# Patient Record
Sex: Male | Born: 1945 | Race: White | Hispanic: No | Marital: Married | Smoking: Former smoker
Health system: Southern US, Community
[De-identification: ages and names within clinical notes are randomized; demographics above are authoritative.]

## PROBLEM LIST (undated history)

## (undated) DIAGNOSIS — I739 Peripheral vascular disease, unspecified: Secondary | ICD-10-CM

## (undated) DIAGNOSIS — K219 Gastro-esophageal reflux disease without esophagitis: Secondary | ICD-10-CM

## (undated) DIAGNOSIS — T8859XA Other complications of anesthesia, initial encounter: Secondary | ICD-10-CM

## (undated) DIAGNOSIS — I7300 Raynaud's syndrome without gangrene: Secondary | ICD-10-CM

## (undated) DIAGNOSIS — E785 Hyperlipidemia, unspecified: Secondary | ICD-10-CM

## (undated) DIAGNOSIS — N400 Enlarged prostate without lower urinary tract symptoms: Secondary | ICD-10-CM

## (undated) DIAGNOSIS — J309 Allergic rhinitis, unspecified: Secondary | ICD-10-CM

## (undated) DIAGNOSIS — Z9889 Other specified postprocedural states: Secondary | ICD-10-CM

## (undated) DIAGNOSIS — K5730 Diverticulosis of large intestine without perforation or abscess without bleeding: Secondary | ICD-10-CM

## (undated) DIAGNOSIS — Z86010 Personal history of colonic polyps: Secondary | ICD-10-CM

## (undated) DIAGNOSIS — R109 Unspecified abdominal pain: Secondary | ICD-10-CM

## (undated) DIAGNOSIS — T4145XA Adverse effect of unspecified anesthetic, initial encounter: Secondary | ICD-10-CM

## (undated) DIAGNOSIS — R9389 Abnormal findings on diagnostic imaging of other specified body structures: Secondary | ICD-10-CM

## (undated) DIAGNOSIS — F4312 Post-traumatic stress disorder, chronic: Secondary | ICD-10-CM

## (undated) DIAGNOSIS — R51 Headache: Secondary | ICD-10-CM

## (undated) DIAGNOSIS — F419 Anxiety disorder, unspecified: Secondary | ICD-10-CM

## (undated) DIAGNOSIS — G4700 Insomnia, unspecified: Secondary | ICD-10-CM

## (undated) DIAGNOSIS — K644 Residual hemorrhoidal skin tags: Secondary | ICD-10-CM

## (undated) DIAGNOSIS — M1710 Unilateral primary osteoarthritis, unspecified knee: Secondary | ICD-10-CM

## (undated) DIAGNOSIS — R112 Nausea with vomiting, unspecified: Secondary | ICD-10-CM

## (undated) DIAGNOSIS — H9190 Unspecified hearing loss, unspecified ear: Secondary | ICD-10-CM

## (undated) HISTORY — PX: OTHER SURGICAL HISTORY: SHX169

## (undated) HISTORY — DX: Hyperlipidemia, unspecified: E78.5

## (undated) HISTORY — DX: Unspecified hearing loss, unspecified ear: H91.90

## (undated) HISTORY — DX: Insomnia, unspecified: G47.00

## (undated) HISTORY — DX: Gastro-esophageal reflux disease without esophagitis: K21.9

## (undated) HISTORY — PX: UPPER GASTROINTESTINAL ENDOSCOPY: SHX188

## (undated) HISTORY — DX: Peripheral vascular disease, unspecified: I73.9

## (undated) HISTORY — DX: Post-traumatic stress disorder, chronic: F43.12

## (undated) HISTORY — DX: Raynaud's syndrome without gangrene: I73.00

## (undated) HISTORY — DX: Headache: R51

## (undated) HISTORY — PX: POLYPECTOMY: SHX149

## (undated) HISTORY — DX: Residual hemorrhoidal skin tags: K64.4

## (undated) HISTORY — DX: Abnormal findings on diagnostic imaging of other specified body structures: R93.89

## (undated) HISTORY — DX: Benign prostatic hyperplasia without lower urinary tract symptoms: N40.0

## (undated) HISTORY — DX: Personal history of colonic polyps: Z86.010

## (undated) HISTORY — PX: APPENDECTOMY: SHX54

## (undated) HISTORY — DX: Unspecified abdominal pain: R10.9

## (undated) HISTORY — DX: Diverticulosis of large intestine without perforation or abscess without bleeding: K57.30

## (undated) HISTORY — DX: Unilateral primary osteoarthritis, unspecified knee: M17.10

## (undated) HISTORY — DX: Allergic rhinitis, unspecified: J30.9

## (undated) HISTORY — DX: Anxiety disorder, unspecified: F41.9

---

## 1998-07-15 ENCOUNTER — Encounter (INDEPENDENT_AMBULATORY_CARE_PROVIDER_SITE_OTHER): Payer: Self-pay | Admitting: Specialist

## 1998-07-15 ENCOUNTER — Other Ambulatory Visit: Admission: RE | Admit: 1998-07-15 | Discharge: 1998-07-15 | Payer: Self-pay | Admitting: General Surgery

## 2000-01-02 HISTORY — PX: OTHER SURGICAL HISTORY: SHX169

## 2001-06-02 ENCOUNTER — Encounter: Payer: Self-pay | Admitting: Internal Medicine

## 2001-06-02 ENCOUNTER — Encounter: Admission: RE | Admit: 2001-06-02 | Discharge: 2001-06-02 | Payer: Self-pay | Admitting: Internal Medicine

## 2003-11-22 ENCOUNTER — Ambulatory Visit: Payer: Self-pay | Admitting: Internal Medicine

## 2003-12-07 ENCOUNTER — Ambulatory Visit: Payer: Self-pay

## 2003-12-22 ENCOUNTER — Ambulatory Visit: Payer: Self-pay | Admitting: Internal Medicine

## 2003-12-29 ENCOUNTER — Ambulatory Visit (HOSPITAL_COMMUNITY): Admission: RE | Admit: 2003-12-29 | Discharge: 2003-12-29 | Payer: Self-pay | Admitting: Internal Medicine

## 2005-01-01 HISTORY — PX: OTHER SURGICAL HISTORY: SHX169

## 2005-01-25 ENCOUNTER — Ambulatory Visit: Payer: Self-pay | Admitting: Internal Medicine

## 2005-02-22 ENCOUNTER — Ambulatory Visit: Payer: Self-pay | Admitting: Internal Medicine

## 2005-04-10 ENCOUNTER — Ambulatory Visit: Payer: Self-pay | Admitting: Internal Medicine

## 2005-09-22 ENCOUNTER — Emergency Department (HOSPITAL_COMMUNITY): Admission: EM | Admit: 2005-09-22 | Discharge: 2005-09-22 | Payer: Self-pay | Admitting: Family Medicine

## 2005-09-30 ENCOUNTER — Emergency Department (HOSPITAL_COMMUNITY): Admission: EM | Admit: 2005-09-30 | Discharge: 2005-09-30 | Payer: Self-pay | Admitting: Emergency Medicine

## 2005-10-03 ENCOUNTER — Ambulatory Visit (HOSPITAL_COMMUNITY): Admission: RE | Admit: 2005-10-03 | Discharge: 2005-10-04 | Payer: Self-pay | Admitting: Neurological Surgery

## 2006-01-16 ENCOUNTER — Ambulatory Visit: Payer: Self-pay | Admitting: Internal Medicine

## 2006-07-17 ENCOUNTER — Ambulatory Visit: Payer: Self-pay | Admitting: Gastroenterology

## 2006-07-19 ENCOUNTER — Ambulatory Visit: Payer: Self-pay | Admitting: Gastroenterology

## 2006-07-25 ENCOUNTER — Ambulatory Visit: Payer: Self-pay | Admitting: Gastroenterology

## 2006-07-25 HISTORY — PX: COLONOSCOPY: SHX174

## 2006-07-25 LAB — HM COLONOSCOPY

## 2006-08-21 ENCOUNTER — Encounter: Payer: Self-pay | Admitting: Internal Medicine

## 2006-08-21 DIAGNOSIS — N400 Enlarged prostate without lower urinary tract symptoms: Secondary | ICD-10-CM | POA: Insufficient documentation

## 2006-08-21 DIAGNOSIS — H9190 Unspecified hearing loss, unspecified ear: Secondary | ICD-10-CM | POA: Insufficient documentation

## 2006-08-21 DIAGNOSIS — IMO0002 Reserved for concepts with insufficient information to code with codable children: Secondary | ICD-10-CM | POA: Insufficient documentation

## 2006-08-21 DIAGNOSIS — E785 Hyperlipidemia, unspecified: Secondary | ICD-10-CM

## 2006-08-21 DIAGNOSIS — I7300 Raynaud's syndrome without gangrene: Secondary | ICD-10-CM

## 2006-08-21 DIAGNOSIS — M179 Osteoarthritis of knee, unspecified: Secondary | ICD-10-CM | POA: Insufficient documentation

## 2006-08-21 DIAGNOSIS — M1710 Unilateral primary osteoarthritis, unspecified knee: Secondary | ICD-10-CM

## 2006-08-21 HISTORY — DX: Benign prostatic hyperplasia without lower urinary tract symptoms: N40.0

## 2006-08-21 HISTORY — DX: Reserved for concepts with insufficient information to code with codable children: IMO0002

## 2006-08-21 HISTORY — DX: Osteoarthritis of knee, unspecified: M17.9

## 2006-08-21 HISTORY — DX: Raynaud's syndrome without gangrene: I73.00

## 2006-08-21 HISTORY — DX: Unspecified hearing loss, unspecified ear: H91.90

## 2006-08-21 HISTORY — DX: Hyperlipidemia, unspecified: E78.5

## 2007-04-18 DIAGNOSIS — K644 Residual hemorrhoidal skin tags: Secondary | ICD-10-CM

## 2007-04-18 DIAGNOSIS — E7800 Pure hypercholesterolemia, unspecified: Secondary | ICD-10-CM | POA: Insufficient documentation

## 2007-04-18 DIAGNOSIS — Z860100 Personal history of colon polyps, unspecified: Secondary | ICD-10-CM | POA: Insufficient documentation

## 2007-04-18 DIAGNOSIS — K5730 Diverticulosis of large intestine without perforation or abscess without bleeding: Secondary | ICD-10-CM

## 2007-04-18 HISTORY — DX: Diverticulosis of large intestine without perforation or abscess without bleeding: K57.30

## 2007-04-18 HISTORY — DX: Residual hemorrhoidal skin tags: K64.4

## 2007-04-18 HISTORY — DX: Personal history of colonic polyps: Z86.010

## 2007-04-18 HISTORY — DX: Personal history of colon polyps, unspecified: Z86.0100

## 2007-08-20 ENCOUNTER — Ambulatory Visit: Payer: Self-pay | Admitting: Internal Medicine

## 2007-08-20 LAB — CONVERTED CEMR LAB
ALT: 29 units/L (ref 0–53)
AST: 26 units/L (ref 0–37)
Albumin: 3.7 g/dL (ref 3.5–5.2)
BUN: 19 mg/dL (ref 6–23)
Basophils Relative: 0.9 % (ref 0.0–3.0)
Chloride: 104 meq/L (ref 96–112)
Creatinine, Ser: 1 mg/dL (ref 0.4–1.5)
Direct LDL: 115.6 mg/dL
Eosinophils Absolute: 0.1 10*3/uL (ref 0.0–0.7)
Eosinophils Relative: 2.5 % (ref 0.0–5.0)
GFR calc non Af Amer: 81 mL/min
Glucose, Bld: 102 mg/dL — ABNORMAL HIGH (ref 70–99)
HCT: 43.5 % (ref 39.0–52.0)
MCV: 87.3 fL (ref 78.0–100.0)
Neutrophils Relative %: 56.4 % (ref 43.0–77.0)
RBC: 4.98 M/uL (ref 4.22–5.81)
Specific Gravity, Urine: 1.02 (ref 1.000–1.03)
Total Protein, Urine: NEGATIVE mg/dL
Total Protein: 6.8 g/dL (ref 6.0–8.3)
Urine Glucose: NEGATIVE mg/dL
Urobilinogen, UA: 1 (ref 0.0–1.0)
WBC: 4.5 10*3/uL (ref 4.5–10.5)

## 2007-08-25 ENCOUNTER — Ambulatory Visit: Payer: Self-pay | Admitting: Internal Medicine

## 2007-08-25 DIAGNOSIS — I739 Peripheral vascular disease, unspecified: Secondary | ICD-10-CM

## 2007-08-25 DIAGNOSIS — J309 Allergic rhinitis, unspecified: Secondary | ICD-10-CM

## 2007-08-25 HISTORY — DX: Allergic rhinitis, unspecified: J30.9

## 2007-08-25 HISTORY — DX: Peripheral vascular disease, unspecified: I73.9

## 2007-09-03 ENCOUNTER — Ambulatory Visit: Payer: Self-pay | Admitting: Internal Medicine

## 2007-11-25 IMAGING — CR DG LUMBAR SPINE COMPLETE 4+V
5 series · 5 of 5 positions shown · non-contrast
Comparison: none

CLINICAL DATA: Back pain.  Bilateral leg pain. 
 LUMBAR SPINE ? 4 VIEW:

[view not recorded (1 of 5)]
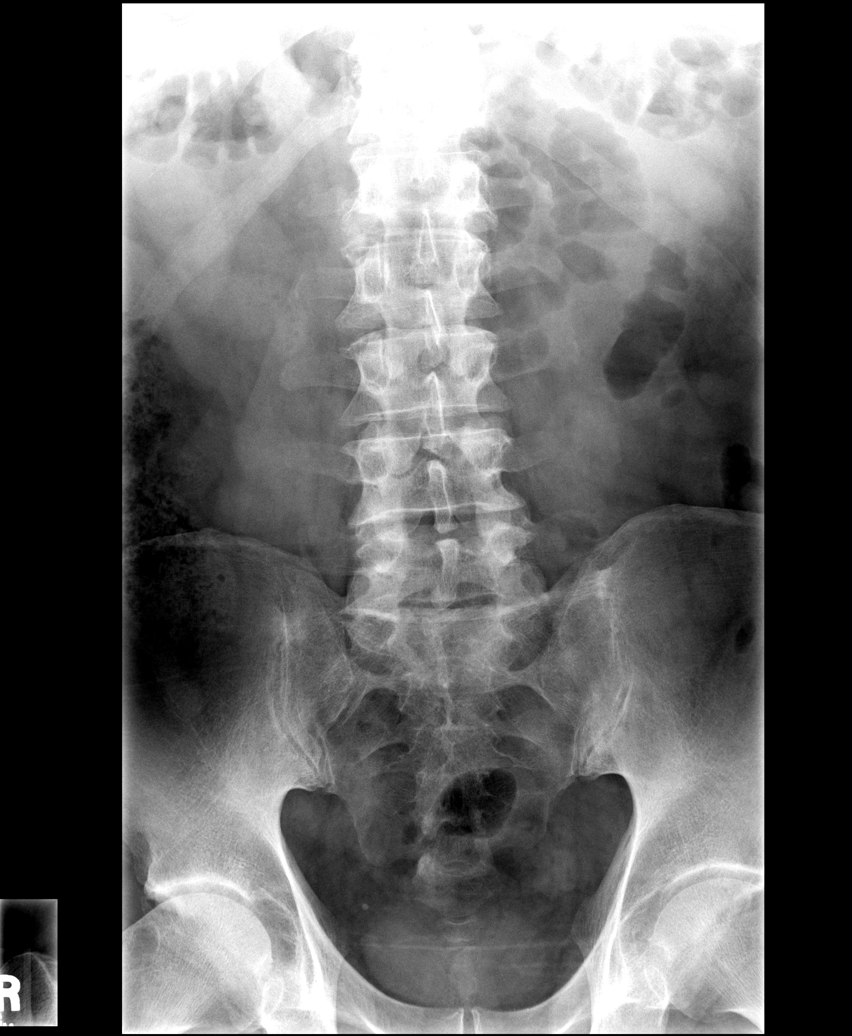

[view not recorded (2 of 5)]
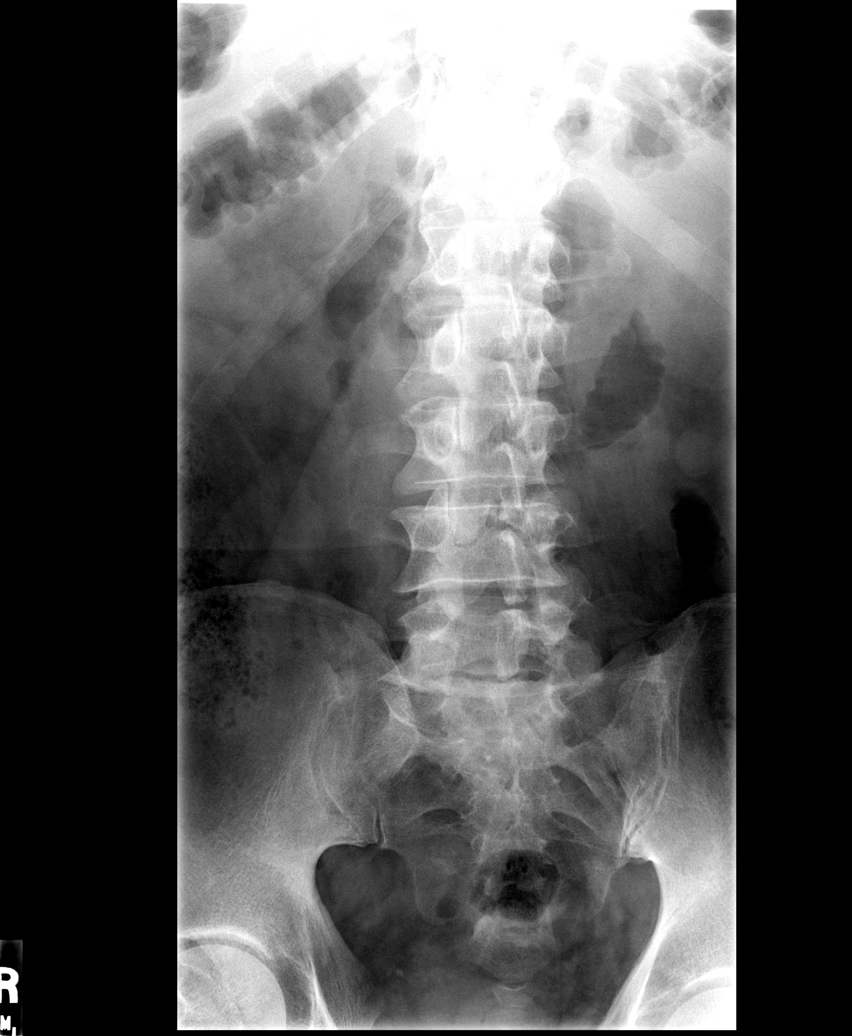

[view not recorded (3 of 5)]
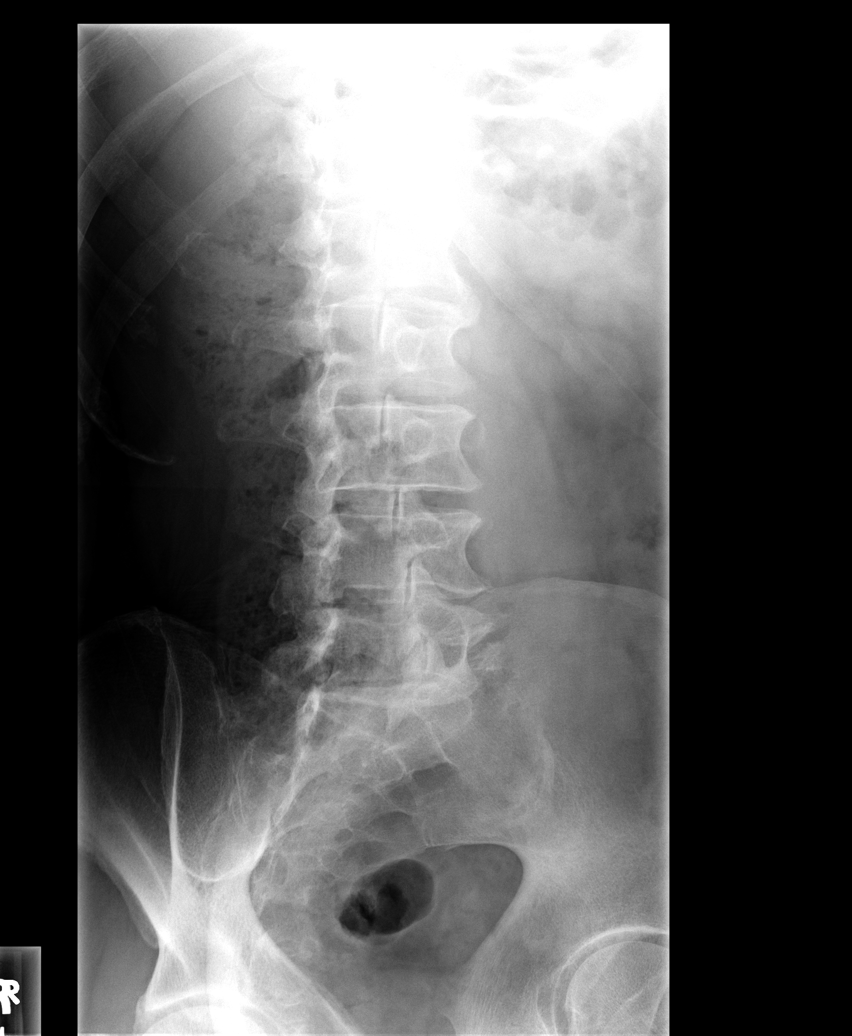

[view not recorded (4 of 5)]
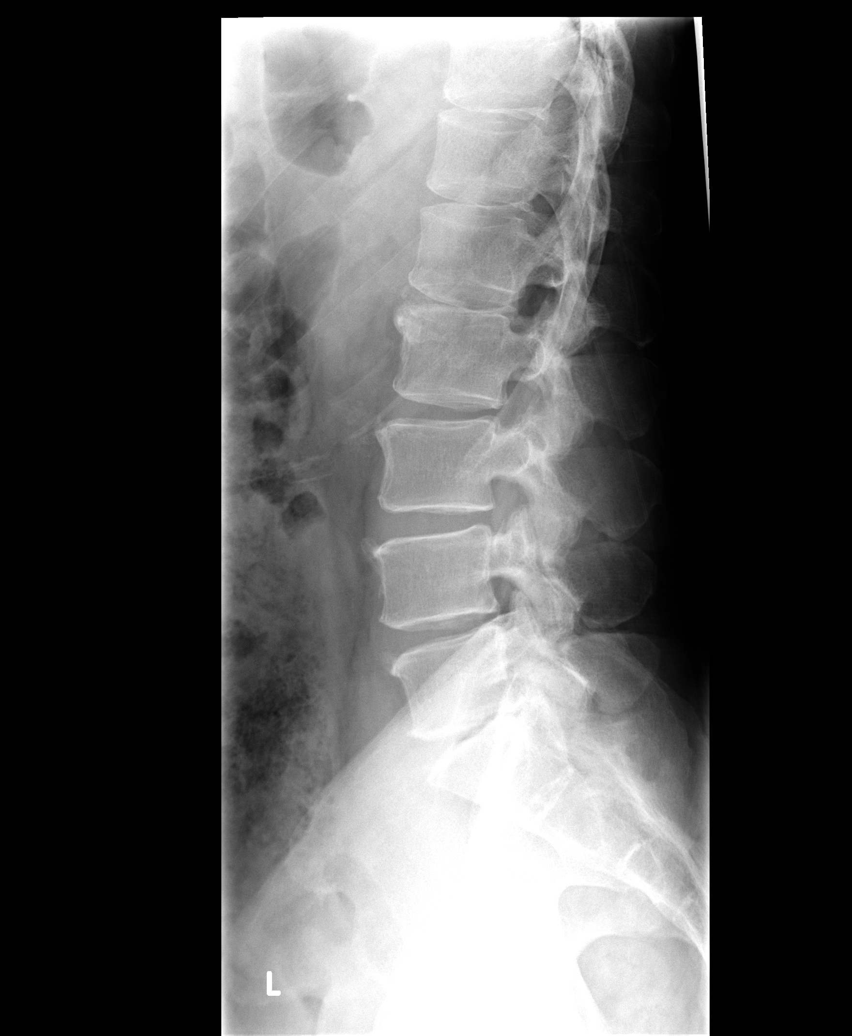

[view not recorded (5 of 5)]
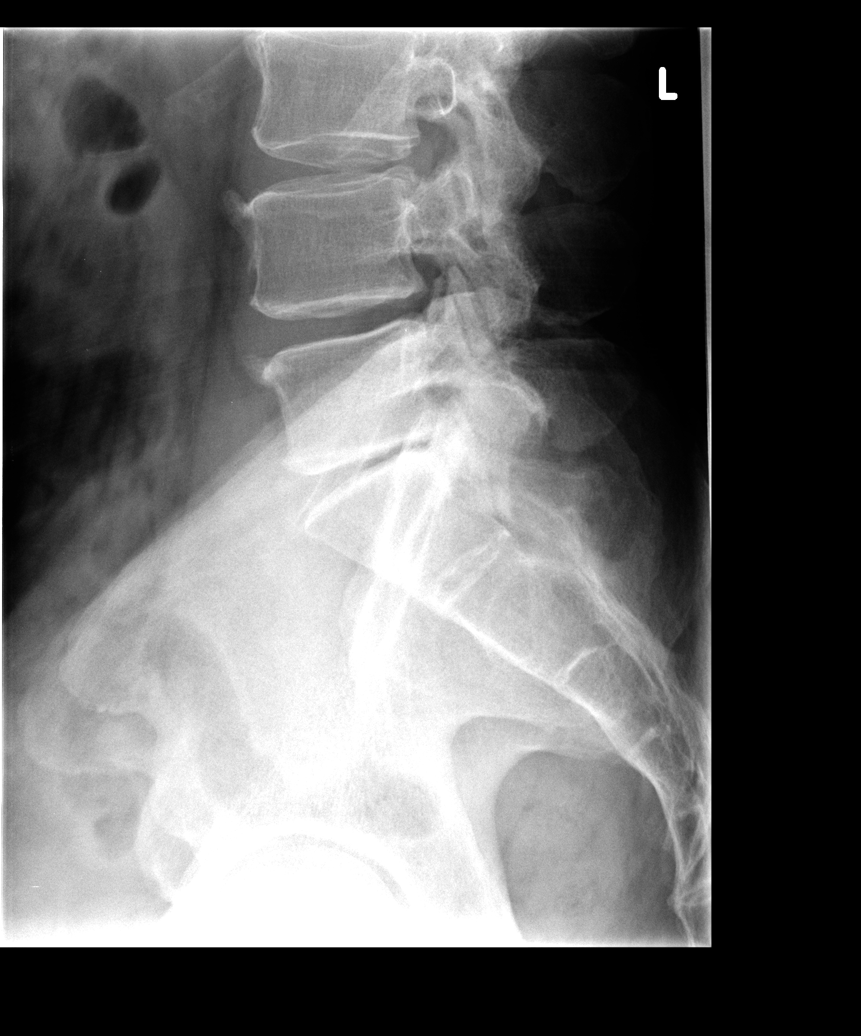

[5 of 5 positions shown; findings below may reference images not displayed]

FINDINGS: Normal alignment.  No fracture. There is lumbar disc degeneration at all levels, most prominent L4-5 and L5-S1 with disc space narrowing and mild spurring.
IMPRESSION: Lumbar disc degeneration.  Negative for fracture.

## 2008-04-30 ENCOUNTER — Ambulatory Visit: Payer: Self-pay | Admitting: Internal Medicine

## 2008-04-30 DIAGNOSIS — R109 Unspecified abdominal pain: Secondary | ICD-10-CM | POA: Insufficient documentation

## 2008-04-30 HISTORY — DX: Unspecified abdominal pain: R10.9

## 2008-05-03 ENCOUNTER — Encounter (INDEPENDENT_AMBULATORY_CARE_PROVIDER_SITE_OTHER): Payer: Self-pay | Admitting: *Deleted

## 2008-05-03 ENCOUNTER — Ambulatory Visit: Payer: Self-pay | Admitting: Cardiology

## 2008-05-03 ENCOUNTER — Telehealth: Payer: Self-pay | Admitting: Internal Medicine

## 2008-05-03 DIAGNOSIS — R9389 Abnormal findings on diagnostic imaging of other specified body structures: Secondary | ICD-10-CM

## 2008-05-03 HISTORY — DX: Abnormal findings on diagnostic imaging of other specified body structures: R93.89

## 2008-05-03 LAB — CONVERTED CEMR LAB
AST: 20 units/L (ref 0–37)
Alkaline Phosphatase: 63 units/L (ref 39–117)
BUN: 17 mg/dL (ref 6–23)
Basophils Absolute: 0.1 10*3/uL (ref 0.0–0.1)
Bilirubin Urine: NEGATIVE
Calcium: 9.7 mg/dL (ref 8.4–10.5)
Creatinine, Ser: 0.9 mg/dL (ref 0.4–1.5)
Eosinophils Absolute: 0.1 10*3/uL (ref 0.0–0.7)
GFR calc non Af Amer: 91.2 mL/min (ref >60)
Glucose, Bld: 103 mg/dL — ABNORMAL HIGH (ref 70–99)
H Pylori IgG: POSITIVE
Ketones, ur: NEGATIVE mg/dL
Leukocytes, UA: NEGATIVE
Lipase: 16 units/L (ref 11.0–59.0)
Lymphocytes Relative: 24.7 % (ref 12.0–46.0)
MCHC: 34.5 g/dL (ref 30.0–36.0)
Monocytes Relative: 3.7 % (ref 3.0–12.0)
Nitrite: NEGATIVE
Platelets: 185 10*3/uL (ref 150.0–400.0)
RDW: 13.1 % (ref 11.5–14.6)
Sed Rate: 7 mm/hr (ref 0–22)
Sodium: 140 meq/L (ref 135–145)
Specific Gravity, Urine: >=1.03 (ref 1.000–1.030)
Total Bilirubin: 0.7 mg/dL (ref 0.3–1.2)
Urobilinogen, UA: 0.2 (ref 0.0–1.0)
pH: 5 (ref 5.0–8.0)

## 2008-05-17 ENCOUNTER — Encounter: Payer: Self-pay | Admitting: Internal Medicine

## 2009-09-20 ENCOUNTER — Ambulatory Visit: Payer: Self-pay | Admitting: Internal Medicine

## 2009-09-20 DIAGNOSIS — R519 Headache, unspecified: Secondary | ICD-10-CM

## 2009-09-20 DIAGNOSIS — G4700 Insomnia, unspecified: Secondary | ICD-10-CM | POA: Insufficient documentation

## 2009-09-20 DIAGNOSIS — R51 Headache: Secondary | ICD-10-CM

## 2009-09-20 HISTORY — DX: Headache: R51

## 2009-09-20 HISTORY — DX: Headache, unspecified: R51.9

## 2009-09-20 HISTORY — DX: Insomnia, unspecified: G47.00

## 2009-09-21 LAB — CONVERTED CEMR LAB
Alkaline Phosphatase: 64 units/L (ref 39–117)
BUN: 18 mg/dL (ref 6–23)
Basophils Absolute: 0 10*3/uL (ref 0.0–0.1)
Bilirubin Urine: NEGATIVE
Bilirubin, Direct: 0.1 mg/dL (ref 0.0–0.3)
CO2: 29 meq/L (ref 19–32)
Calcium: 9.8 mg/dL (ref 8.4–10.5)
Chloride: 104 meq/L (ref 96–112)
Creatinine, Ser: 1 mg/dL (ref 0.4–1.5)
Eosinophils Absolute: 0.1 10*3/uL (ref 0.0–0.7)
HDL: 43.3 mg/dL (ref >39.00)
Ketones, ur: NEGATIVE mg/dL
Lymphocytes Relative: 31.9 % (ref 12.0–46.0)
MCHC: 34.5 g/dL (ref 30.0–36.0)
MCV: 88.7 fL (ref 78.0–100.0)
Monocytes Absolute: 0.5 10*3/uL (ref 0.1–1.0)
Neutrophils Relative %: 58.9 % (ref 43.0–77.0)
PSA: 1.63 ng/mL (ref 0.10–4.00)
Platelets: 196 10*3/uL (ref 150.0–400.0)
RBC: 4.82 M/uL (ref 4.22–5.81)
RDW: 14.2 % (ref 11.5–14.6)
Specific Gravity, Urine: >=1.03 (ref 1.000–1.030)
TSH: 1.31 microintl units/mL (ref 0.35–5.50)
Total Bilirubin: 0.5 mg/dL (ref 0.3–1.2)
Total CHOL/HDL Ratio: 5
Total Protein: 7.2 g/dL (ref 6.0–8.3)
Triglycerides: 490 mg/dL — ABNORMAL HIGH (ref 0.0–149.0)
Urine Glucose: NEGATIVE mg/dL
Urobilinogen, UA: 0.2 (ref 0.0–1.0)
VLDL: 98 mg/dL — ABNORMAL HIGH (ref 0.0–40.0)

## 2010-01-21 ENCOUNTER — Encounter: Payer: Self-pay | Admitting: Internal Medicine

## 2010-01-31 NOTE — Assessment & Plan Note (Signed)
Summary: MED REFILL FU--STC   Vital Signs:  Patient profile:   65 year old male Height:      70 inches Weight:      187.38 pounds BMI:     26.98 O2 Sat:      97 % on Room air Temp:     97.7 degrees F oral Pulse rate:   69 / minute BP sitting:   108 / 66  (left arm) Cuff size:   regular  O2 Flow:  Room air  Preventive Care Screening     declines tetanus, flu shot  CC: Trouble sleeping, refill of Meloxicam, pt declined FluVax/wellness   CC:  Trouble sleeping, refill of Meloxicam, and pt declined FluVax/wellness.  History of Present Illness: here for wellness and f/u - overall doing OK except for most nights with trouble with insomnia , tends to wake about 230 in the am, awake for 1 hr, then back to sleep; tends to be related to worries about things he has on his list to do;  denies incresed other depressive symtpoms, anxiety or panic.  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  also with recurring mild to occasionally severe frontal headache, has taken up to 5 ibuprofen per day for 3 days with last one that did not seem to help;  , no radaiton, lasts up to 3 days throbbing, worse at night;  no photophobia, no phonophobia', but assoc with hyperphagia; no dizziness, blurred vision or syncope.  Has tried OTC he thinks tylenoll migrinae but has not helped.  Pt denies new neuro symptoms such as headache, facial or extremity weakness  No fever, wt loss, night sweats, loss of appetite or other constitutional symptoms  Denies polydipsia, or polyuria.  Pt denies CP, worsening sob, doe, wheezing, orthopnea, pnd, worsening LE edema, palps, dizziness or syncope  No hx of heart disease  Problems Prior to Update: 1)  Preventive Health Care  (ICD-V70.0) 2)  Headache  (ICD-784.0) 3)  Insomnia-sleep Disorder-unspec  (ICD-780.52) 4)  Nonspecific Abn Finding Rad & Oth Exam Gu Organ  (ICD-793.5) 5)  Abdominal Pain Other Specified Site  (ICD-789.09) 6)   Preventive Health Care  (ICD-V70.0) 7)  Diverticulosis, Colon  (ICD-562.10) 8)  Peripheral Vascular Disease  (ICD-443.9) 9)  Allergic Rhinitis  (ICD-477.9) 10)  External Hemorrhoids  (ICD-455.3) 11)  Diverticulosis of Colon  (ICD-562.10) 12)  Hypercholesterolemia  (ICD-272.0) 13)  Colonic Polyps, Hx of  (ICD-V12.72) 14)  Hearing Loss, Right Ear  (ICD-389.9) 15)  Degenerative Joint Disease, Knees, Bilateral  (ICD-715.96) 16)  Raynaud's Syndrome  (ICD-443.0) 17)  Benign Prostatic Hypertrophy  (ICD-600.00) 18)  Hyperlipidemia  (ICD-272.4)  Medications Prior to Update: 1)  Crestor 20 Mg  Tabs (Rosuvastatin Calcium) .... 1/2  By Mouth Once Daily 2)  Meloxicam 15 Mg Tabs (Meloxicam) .[name].. 1 By Mouth Once Daily As Needed 3)  Adult Aspirin Ec Low Strength 81 Mg Tbec (Aspirin) .[name].. 1 By Mouth Once Daily 4)  Omeprazole 20 Mg Tbec (Omeprazole) .... 2 By Mouth Once Daily For 2 Wks 5)  Amoxil 500 Mg Caps (Amoxicillin) .... 2 By Mouth Two Times A Day For 2 Wks 6)  Clarithromycin 500 Mg Tabs (Clarithromycin) .[name].. 1po Two Times A Day For 2 Wks  Current Medications (verified): 1)  Crestor 20 Mg  Tabs (Rosuvastatin Calcium) .... 1/2  By Mouth Once Daily 2)  Meloxicam 15 Mg Tabs (Meloxicam) .[name].. 1 By Mouth Once Daily As Needed 3)  Adult Aspirin Ec Low Strength 81 Mg Tbec (Aspirin) .[name].. 1 By Mouth Once Daily 4)  Sumatriptan Succinate 100 Mg Tabs (Sumatriptan Succinate) .[name].. 1po Every Other Day As Needed 5)  Zolpidem Tartrate 10 Mg Tabs (Zolpidem Tartrate) .[name].. 1po At Bedtime As Needed  Allergies (verified): 1)  ! 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 brother with renal cell  cancer mother and uncle with DM  Social History: Reviewed history from 08/25/2007 and no changes required. 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         all otherwise negative per pt -    Physical Exam  General:  alert and well-developed.   Head:  normocephalic and atraumatic.   Eyes:  vision grossly intact, pupils equal, and pupils round.   Ears:  R ear normal and L ear normal.   Nose:  no external deformity and no nasal discharge.   Mouth:  good dentition and pharynx pink and moist.   Neck:  supple and no masses.   Lungs:  normal respiratory effort and normal breath sounds.   Heart:  normal rate, regular rhythm, and no gallop.   Abdomen:  , small ventral hernia notedsoft, non-tender, normal bowel sounds, no distention, no masses, and no guarding.   Msk:  no joint  tenderness and no joint swelling.   Extremities:  no edema, no ulcers  Neurologic:  alert & oriented X3, cranial nerves II-XII intact, and strength normal in all extremities.   Skin:  color normal and no rashes.   Psych:  not depressed appearing and slightly anxious.     Impression & Recommendations:  Problem # 1:  Preventive Health Care (ICD-V70.0)  Overall doing well, age appropriate education and counseling updated and referral for appropriate preventive services done unless declined, immunizations up to date or declined, diet counseling done if overweight, urged to quit smoking if smokes , most recent labs reviewed and current ordered if appropriate, ecg reviewed or declined (interpretation per ECG scanned in the EMR if done); information regarding Medicare Prevention requirements given if appropriate; speciality referrals updated as appropriate   Orders: TLB-BMP (Basic Metabolic Panel-BMET) (80048-METABOL) TLB-CBC Platelet - w/Differential (85025-CBCD) TLB-Hepatic/Liver Function Pnl (80076-HEPATIC) TLB-Lipid Panel (80061-LIPID) TLB-TSH (Thyroid Stimulating Hormone) (84443-TSH) TLB-PSA (Prostate Specific Antigen) (84153-PSA) TLB-Udip ONLY (81003-UDIP)  Problem # 2:  INSOMNIA-SLEEP DISORDER-UNSPEC (ICD-780.52)  His  updated medication list for this problem includes:    Zolpidem Tartrate 10 Mg Tabs (Zolpidem tartrate) .[name]... 1po at bedtime as needed treat as above, f/u any worsening signs or symptoms   Problem # 3:  HEADACHE (ICD-784.0)  His updated medication list for this problem includes:    Meloxicam 15 Mg Tabs (Meloxicam) .[name]... 1 by mouth once daily as needed    Adult Aspirin Ec Low Strength 81 Mg Tbec (Aspirin) .[name]... 1 by mouth once daily    Sumatriptan Succinate 100 Mg Tabs (Sumatriptan succinate) .[name]... 1po every other day as needed treat as above, f/u any worsening signs or symptoms , also for excedrin migraine otc as needed   Complete Medication List: 1)  Crestor 20 Mg  Tabs (Rosuvastatin calcium) .... 1/2  by mouth once daily 2)  Meloxicam 15 Mg Tabs (Meloxicam) .[name].. 1 by mouth once daily as needed 3)  Adult Aspirin Ec Low Strength 81 Mg Tbec (Aspirin) .[name].. 1 by mouth once daily 4)  Sumatriptan Succinate 100 Mg Tabs (Sumatriptan succinate) .[name].. 1po every other day as needed 5)  Zolpidem Tartrate 10 Mg Tabs (Zolpidem tartrate) .[name].. 1po at bedtime as needed  Patient Instructions: 1)  Please take all new medications as prescribed 2)  Continue all previous medications as before this visit 3)  Please go to the Lab in the basement for your blood and/or urine tests today 4)  Please call the number on the [facility] Card for results of your testing 5)  Please schedule a follow-up appointment in 1 year or sooner if needed Prescriptions: ZOLPIDEM TARTRATE 10 MG TABS (ZOLPIDEM TARTRATE) 1po at bedtime as needed  #30 x 5   Entered and Authorized by:   Corwin Levins MD   Signed by:   Corwin Levins MD on 09/20/2009   Method used:   Print then Give to Patient   RxID:   [id number] CRESTOR 20 MG  TABS (ROSUVASTATIN CALCIUM) 1  by mouth once daily  #90 x 3   Entered and Authorized by:   Corwin Levins MD   Signed by:   Corwin Levins MD on 09/20/2009   Method used:   Electronically to        [facility] #[id number]* (retail)       [address]       [place], [place]  [address]       Ph: [phone]       Fax: [phone]   RxID:   [id number] MELOXICAM 15 MG TABS (MELOXICAM) 1 by mouth once daily as needed  #90 x 3   Entered and Authorized by:   Corwin Levins MD   Signed by:   Corwin Levins MD on 09/20/2009   Method used:   Electronically to        [facility] #[id number]* (retail)       [address]       [place], [place]  [address]       Ph: [phone]       Fax: [phone]   RxID:   [id number] SUMATRIPTAN SUCCINATE 100 MG TABS (SUMATRIPTAN SUCCINATE) 1po every other day as  needed  #9 x 11   Entered and Authorized by:   Corwin Levins MD   Signed by:   Corwin Levins MD on 09/20/2009   Method used:  Electronically to        [id number]* (retail)       [address]       [place], [place]  [address]       Ph: [phone]       Fax: [phone]   RxID:   [id number]

## 2010-03-27 LAB — HM COLONOSCOPY

## 2010-05-16 NOTE — Letter (Signed)
May 03, 2008    Corwin Levins, MD  [address]  [place], [place] [address]   RE:  Jason Robles, Jason Robles  MRN:  [id number]  /  DOB:  08-29-1945   Dear Rosanne Ashing,   It was a pleasure to speak with you regarding, Carey Johndrow. Kane.  As  you know, you ordered an abdominal CAT scan which was done in the  [facility] cardiology facility.  This scan is read per radiology.  However,  during contrast injection, the patient did develop itching.  He was  given Benadryl with subsequent resolution.  Clinically, he is stable, he  has no wheezing and no significant shortness of breath.  He is being  discharged.  As per our conversation, you are documenting in his chart  the reaction so that is available should he be seen again.     Sincerely,      Arturo Morton. Riley Kill, MD, [facility]  Electronically Signed    TDS/MedQ  DD: 05/03/2008  DT: 05/04/2008  Job #: [id number]

## 2010-05-16 NOTE — Assessment & Plan Note (Signed)
[facility]                         GASTROENTEROLOGY OFFICE NOTE   NAME:Jason Robles, Jason Robles                   MRN:          [id number]  DATE:07/17/2006                            DOB:          07-22-1945    PROBLEM:  1. History of colon polyps.  2. Limited rectal bleeding.   HISTORY OF PRESENT ILLNESS:  This is a pleasant 65 year old white male  formerly seen by Dr. Victorino Robles for polyps.  Last colonoscopy in 2003  demonstrated a sigmoid polyp.  Jason Robles tends to have loose stools.  Over the last week he has seen small amounts of blood in the toilet  water.  He denies abdominal or rectal pain.  He has a history of hemorrhoids.   PAST MEDICAL HISTORY:  Unremarkable.   FAMILY HISTORY:  Pertinent for a brother with lung cancer.   MEDICATIONS:  Meloxicam and Crestor.  He has no allergies.   SOCIAL HISTORY:  He does not smoke, he has 3-4 drinks a week.  He is  married and is self-employed.   REVIEW OF SYSTEMS:  Positive for joint pains.   PHYSICAL EXAMINATION:  Pulse 56, blood pressure 100/68, weight 185.  HEENT:  EOMI. PERRLA. Sclerae are anicteric.  Conjunctivae are pink.  NECK:  Supple without thyromegaly, adenopathy or carotid bruits.  CHEST:  Clear to auscultation and percussion without adventitious  sounds.  CARDIAC:  There is a 1/6 early systolic murmur.  Remainder of the exam  is normal.  ABDOMEN:  Bowel sounds are normoactive.  Abdomen is soft, non-tender and  non-distended.  There are no abdominal masses, tenderness, splenic  enlargement or hepatomegaly.  EXTREMITIES:  Full range of motion.  No cyanosis, clubbing or edema.  RECTAL:  Exam is deferred.   IMPRESSION:  1. History of colon polyps.  2. Limited rectal bleeding - most likely secondary to hemorrhoids.   RECOMMENDATION:  Followup colonoscopy.    Jason Robles. Jason Dice, MD,FACG  Electronically Signed   RDK/MedQ  DD: 07/17/2006  DT: 07/17/2006  Job #: [id number]

## 2010-05-19 NOTE — Op Note (Signed)
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   PROCEDURES:  Lumbar hemilaminectomy, medial facetectomy, foraminotomy L4-5  on the left for decompression of the L4 and L5 nerve roots with  microdiskectomy, L4-5 left utilizing microscopic dissection.   SURGEON:  Dr. Marikay Alar.   ASSISTANT:  Dr. Aliene Beams.   ANESTHESIA:  General endotracheal.   COMPLICATIONS:  None apparent.   INDICATIONS FOR PROCEDURE:  Jason Robles is a very pleasant 65 year old  male who presented to emergency department over the weekend with complaints  of severe left leg pain that followed the L4 distribution.  He had MRI which  showed a herniated disk with a free fragment L4-5 on the left side extending  into the foramen causing a left L4 nerve root compression.  He saw me in the  office, had quite severe pain and felt he had some weakness in his leg.  We  talked about risks, benefits and alternatives.  He wished to proceed with  microdiskectomy at L4-5 on the left side in hopes of improving his pain  situation.   DESCRIPTION OF PROCEDURE:  The patient was taken to the operating room and  after induction of adequate generalized endotracheal anesthesia he was  rolled in the prone position on the Wilson frame.  All pressure points were  padded.  His lumbar region was prepped with DuraPrep and draped in the usual  sterile fashion.  3 mL local anesthesia was injected and a small dorsal  midline incision was  made and carried down to the lumbosacral fascia.  The  fascia was opened on the patient's left side and taken down in the  periosteal fashion to expose L4-5 on the left.  Intraoperative x-ray  confirmed my level and a combination of the high-speed drill and the  Kerrison punch was used to perform a hemilaminectomy, medial facetectomy,  foraminotomy at L4-5 on the left side.  The yellow ligament was identified,  opened and removed in a piecemeal fashion, to expose the underlying dura and  L5 nerve root and the L5 nerve root was decompressed.  I then continued my  laminectomy superiorly until I could feel the L4 pedicle.  I identified the  L4 nerve and widen my foraminotomy here.  I was able to coagulate the  epidural venous vasculature and then used a nerve hook to pull four free  fragments out from underneath the left L4 nerve root.  The left L4 nerve  root was then free and pulsatile.  We palpated with the nerve hook and a  coronary dilator into the foramen, we  felt no more free fragments or  compression of the nerve.  The coronary dilator passed easily.  We felt like  we had a good decompression.  Dr. Gerlene Fee confirmed this by also palpating  with a coronary dilator.  We then irrigated with saline solution containing  bacitracin, lined the dura with Gelfoam and then closed the fascia with  interrupted #1  Vicryl, closed subcutaneous and subcuticular tissue with 2-0 and 3-0 Vicryl  and closed the skin with Dermabond.  The drapes were removed.  The patient  was awakened from general anesthesia and transferred to recovery room stable  condition.  At end of procedure all sponge, needle and instrument counts  were correct.      Tia Alert, MD  Electronically Signed     DSJ/MEDQ  D:  10/03/2005  T:  10/04/2005  Job:  [id number]

## 2010-05-22 ENCOUNTER — Other Ambulatory Visit: Payer: Self-pay | Admitting: Internal Medicine

## 2010-05-23 NOTE — Telephone Encounter (Signed)
Rx faxed to pharmacy  

## 2010-06-28 ENCOUNTER — Other Ambulatory Visit (INDEPENDENT_AMBULATORY_CARE_PROVIDER_SITE_OTHER): Payer: 59

## 2010-06-28 ENCOUNTER — Encounter: Payer: Self-pay | Admitting: Internal Medicine

## 2010-06-28 ENCOUNTER — Other Ambulatory Visit: Payer: Self-pay | Admitting: Internal Medicine

## 2010-06-28 ENCOUNTER — Ambulatory Visit (INDEPENDENT_AMBULATORY_CARE_PROVIDER_SITE_OTHER): Payer: 59 | Admitting: Internal Medicine

## 2010-06-28 ENCOUNTER — Ambulatory Visit (INDEPENDENT_AMBULATORY_CARE_PROVIDER_SITE_OTHER)
Admission: RE | Admit: 2010-06-28 | Discharge: 2010-06-28 | Disposition: A | Discharge status: Home / Self Care | Payer: 59 | Source: Ambulatory Visit | Attending: Internal Medicine | Admitting: Internal Medicine

## 2010-06-28 DIAGNOSIS — Z0000 Encounter for general adult medical examination without abnormal findings: Secondary | ICD-10-CM

## 2010-06-28 DIAGNOSIS — R079 Chest pain, unspecified: Secondary | ICD-10-CM | POA: Insufficient documentation

## 2010-06-28 DIAGNOSIS — G459 Transient cerebral ischemic attack, unspecified: Secondary | ICD-10-CM

## 2010-06-28 DIAGNOSIS — N400 Enlarged prostate without lower urinary tract symptoms: Secondary | ICD-10-CM

## 2010-06-28 DIAGNOSIS — F411 Generalized anxiety disorder: Secondary | ICD-10-CM

## 2010-06-28 DIAGNOSIS — F4312 Post-traumatic stress disorder, chronic: Secondary | ICD-10-CM | POA: Insufficient documentation

## 2010-06-28 DIAGNOSIS — Z0001 Encounter for general adult medical examination with abnormal findings: Secondary | ICD-10-CM | POA: Insufficient documentation

## 2010-06-28 DIAGNOSIS — F419 Anxiety disorder, unspecified: Secondary | ICD-10-CM

## 2010-06-28 HISTORY — DX: Anxiety disorder, unspecified: F41.9

## 2010-06-28 HISTORY — DX: Chest pain, unspecified: R07.9

## 2010-06-28 HISTORY — DX: Transient cerebral ischemic attack, unspecified: G45.9

## 2010-06-28 LAB — CBC WITH DIFFERENTIAL/PLATELET
Basophils Relative: 0.5 % (ref 0.0–3.0)
Eosinophils Absolute: 0.1 10*3/uL (ref 0.0–0.7)
Eosinophils Relative: 1.9 % (ref 0.0–5.0)
Lymphocytes Relative: 32.6 % (ref 12.0–46.0)
Monocytes Relative: 7.2 % (ref 3.0–12.0)
Neutrophils Relative %: 57.8 % (ref 43.0–77.0)
RBC: 4.75 Mil/uL (ref 4.22–5.81)
WBC: 5.9 10*3/uL (ref 4.5–10.5)

## 2010-06-28 LAB — HEPATIC FUNCTION PANEL
ALT: 20 U/L (ref 0–53)
Albumin: 4.2 g/dL (ref 3.5–5.2)
Total Protein: 7 g/dL (ref 6.0–8.3)

## 2010-06-28 LAB — URINALYSIS, ROUTINE W REFLEX MICROSCOPIC
Bilirubin Urine: NEGATIVE
Nitrite: NEGATIVE
Total Protein, Urine: NEGATIVE
Urine Glucose: NEGATIVE
pH: 5.5 (ref 5.0–8.0)

## 2010-06-28 LAB — BASIC METABOLIC PANEL
BUN: 24 mg/dL — ABNORMAL HIGH (ref 6–23)
CO2: 34 mEq/L — ABNORMAL HIGH (ref 19–32)
Calcium: 9.5 mg/dL (ref 8.4–10.5)
Chloride: 104 mEq/L (ref 96–112)
Creatinine, Ser: 0.9 mg/dL (ref 0.4–1.5)

## 2010-06-28 LAB — LIPID PANEL
Cholesterol: 168 mg/dL (ref 0–200)
Triglycerides: 237 mg/dL — ABNORMAL HIGH (ref 0.0–149.0)

## 2010-06-28 MED ORDER — TAMSULOSIN HCL 0.4 MG PO CAPS: 0.4000 mg | ORAL_CAPSULE | Freq: Every day | ORAL | Status: DC

## 2010-06-28 NOTE — Assessment & Plan Note (Signed)
Atypical, again to re-start the ASA 81 mg, ecg reveiwed - sinus without acute change,  For stress test

## 2010-06-28 NOTE — Patient Instructions (Addendum)
Please re-start the Aspirin 81 mg - 1 per day - COATED only Your EKG was normal today Take all new medications as prescribed - the flomax Continue all other medications as before Please go to XRAY in the Basement for the x-ray test Please go to LAB in the Basement for the blood and/or urine tests to be done today You will be contacted regarding the referral for: Head MRI, carotid dopplers, echocardiogram, and stress test Please return in 6 mo with Lab testing done 3-5 days before, or sooner if needed

## 2010-06-28 NOTE — Assessment & Plan Note (Signed)
Mild symptoms, worse in the past yr, for UA and trial flomax asd

## 2010-06-28 NOTE — Assessment & Plan Note (Signed)
Mild symptoms, but I suspect a true TIA, to re-start asa 81 mg  And stressed importance of compliacne;  Also for head MRI, carotid dopplers, echo, routine labs

## 2010-06-28 NOTE — Progress Notes (Signed)
  Subjective:    Patient ID: Jason Robles, male    DOB: June 25, 1945, 65 y.o.   MRN: [id number]  HPI  Here with episode 6 wks ago of "feeling weird", hard to find words  and talk clearly, somewhat confused  as he was talking to a customer at work - only lasted 1 -2 minutes, no HA or other obvious neuro symtpoms.   Normally has no problem doing this,  No problem since then.  Not currently taking ASA. Last head MRI neg for "wall-eyed" vision problem about 6 yrs ago. Pt denies other new neurological symptoms such as new headache, or facial or extremity weakness or numbness Pt denies chest pain, increased sob or doe, wheezing, orthopnea, PND, increased LE swelling, palpitations, dizziness or syncope.   Pt denies polydipsia, polyuria  Also 3 wks ago with an episode of dull CP anteriorly assoc with some pain to the arm near the wrist only, lasted 1 min, became anxious he thinks and then broke out into a sweat; no SOB, but did have some nausea wih the sweat,  Felt some lightheaded but no palp, or syncope.  Denies worsening depressive symptoms, suicidal ideation, or panic, though has ongoing anxiety, not increased recently.   Also mentions several months increased mild urinary hesitancy, hard to start with some sense of mild retention, but  Denies urinary symptoms such as dysuria, frequency, urgency,or hematuria.   Review of Systems Review of Systems  Constitutional: Negative for diaphoresis and unexpected weight change.  HENT: Negative for drooling and tinnitus.   Eyes: Negative for photophobia and visual disturbance.  Respiratory: Negative for choking and stridor.   Gastrointestinal: Negative for vomiting and blood in stool.  Genitourinary: Negative for hematuria and decreased urine volume.  but does have sense of urinary retention and nocturia x 1 nightly - worse in the past yr, mild Musculoskeletal: Negative for gait problem.  Skin: Negative for color change and wound.  Neurological: Negative for  tremors and numbness.  Psychiatric/Behavioral: Negative for decreased concentration. The patient is not hyperactive.       Objective:   Physical Exam BP 120/70  Pulse 73  Temp(Src) 98 F (36.7 C) (Oral)  Ht 6' (1.829 m)  Wt 187 lb 6 oz (84.993 kg)  BMI 25.41 kg/m2  SpO2 98% \Physical Exam  VS noted Constitutional: Pt appears well-developed and well-nourished.  HENT: Head: Normocephalic.  Right Ear: External ear normal.  Left Ear: External ear normal.  Eyes: Conjunctivae and EOM are normal. Pupils are equal, round, and reactive to light.  Neck: Normal range of motion. Neck supple.  Cardiovascular: Normal rate and regular rhythm.   Pulmonary/Chest: Effort normal and breath sounds normal.  Abd:  Soft, NT, non-distended, + BS Neurological: Pt is alert. No cranial nerve deficit. motor/sens/dtr, gait intact Skin: Skin is warm. No erythema.  Psychiatric: Pt behavior is normal. Thought content normal. 1+ nervous        Assessment & Plan:

## 2010-06-29 NOTE — Progress Notes (Signed)
Quick Note:  Voice message left on [employer] system - lab is negative, normal or otherwise stable, pt to continue same tx ______ 

## 2010-06-30 ENCOUNTER — Encounter: Payer: Self-pay | Admitting: *Deleted

## 2010-07-05 ENCOUNTER — Encounter: Payer: Self-pay | Admitting: Internal Medicine

## 2010-07-05 NOTE — Assessment & Plan Note (Signed)
Ongoing mild symptoms , but I doubt in this case the primary issue, Continue all other medications as before,   to f/u any worsening symptoms or concerns

## 2010-07-07 ENCOUNTER — Other Ambulatory Visit: Payer: Self-pay | Admitting: Internal Medicine

## 2010-07-07 DIAGNOSIS — G459 Transient cerebral ischemic attack, unspecified: Secondary | ICD-10-CM

## 2010-07-11 ENCOUNTER — Ambulatory Visit (HOSPITAL_COMMUNITY)
Admission: RE | Admit: 2010-07-11 | Discharge: 2010-07-11 | Disposition: A | Discharge status: Home / Self Care | Payer: 59 | Source: Ambulatory Visit | Attending: Internal Medicine | Admitting: Internal Medicine

## 2010-07-11 DIAGNOSIS — G459 Transient cerebral ischemic attack, unspecified: Secondary | ICD-10-CM

## 2010-07-11 DIAGNOSIS — R4789 Other speech disturbances: Secondary | ICD-10-CM | POA: Insufficient documentation

## 2010-07-11 DIAGNOSIS — F29 Unspecified psychosis not due to a substance or known physiological condition: Secondary | ICD-10-CM | POA: Insufficient documentation

## 2010-07-18 ENCOUNTER — Ambulatory Visit (HOSPITAL_COMMUNITY): Payer: 59 | Attending: Internal Medicine | Admitting: Radiology

## 2010-07-18 ENCOUNTER — Ambulatory Visit (HOSPITAL_BASED_OUTPATIENT_CLINIC_OR_DEPARTMENT_OTHER): Payer: 59 | Admitting: Radiology

## 2010-07-18 ENCOUNTER — Ambulatory Visit (INDEPENDENT_AMBULATORY_CARE_PROVIDER_SITE_OTHER): Payer: 59 | Admitting: *Deleted

## 2010-07-18 DIAGNOSIS — R079 Chest pain, unspecified: Secondary | ICD-10-CM | POA: Insufficient documentation

## 2010-07-18 DIAGNOSIS — R072 Precordial pain: Secondary | ICD-10-CM

## 2010-07-18 DIAGNOSIS — G459 Transient cerebral ischemic attack, unspecified: Secondary | ICD-10-CM | POA: Insufficient documentation

## 2010-07-18 DIAGNOSIS — I379 Nonrheumatic pulmonary valve disorder, unspecified: Secondary | ICD-10-CM | POA: Insufficient documentation

## 2010-07-18 DIAGNOSIS — I080 Rheumatic disorders of both mitral and aortic valves: Secondary | ICD-10-CM | POA: Insufficient documentation

## 2010-07-18 MED ORDER — TECHNETIUM TC 99M TETROFOSMIN IV KIT
33.0000 | PACK | Freq: Once | INTRAVENOUS | Status: AC | PRN
  Administered 2010-07-18: 33 via INTRAVENOUS

## 2010-07-18 MED ORDER — TECHNETIUM TC 99M TETROFOSMIN IV KIT
11.0000 | PACK | Freq: Once | INTRAVENOUS | Status: AC | PRN
  Administered 2010-07-18: 11 via INTRAVENOUS

## 2010-07-18 NOTE — Progress Notes (Signed)
[name] SITE 3 NUCLEAR MED [address] [place] [address] [phone]  Cardiology Nuclear Med Jason Robles is a 65 y.o. male [id number] 07-24-1945   Nuclear Med Background Indication for Stress Test:  Evaluation for Ischemia History:  '05 VHQ:IONGEX, EF=68% Cardiac Risk Factors: Lipids, PVD, Smoker and TIA  Symptoms:  Chest Pain (last episode of chest discomfort was about one month ago), Diaphoresis, Dizziness and Fatigue   Nuclear Pre-Procedure Caffeine/Decaff Intake:  8:00pm NPO After: 8:00pm   Lungs:  Clear. IV 0.9% NS with Angio Cath:  20g  IV Site: R Hand  IV Started by:  Cathlyn Parsons, RN  Chest Size (in):  44 Cup Size: n/a  Height: 6' (1.829 m)  Weight:  183 lb (83.008 kg)  BMI:  Body mass index is 24.82 kg/(m^2). Tech Comments:  n/a    Nuclear Med Study 1 or 2 day study: 1 day  Stress Test Type:  Stress  Reading MD: Cassell Clement, MD  Order Authorizing Provider:  Oliver Barre, MD  Resting Radionuclide: Technetium 35m Tetrofosmin  Resting Radionuclide Dose: 11 mCi   Stress Radionuclide:  Technetium 15m Tetrofosmin  Stress Radionuclide Dose: 33 mCi           Stress Protocol Rest HR: 57 Stress HR: 134  Rest BP: 105/83 Stress BP: 168/72  Exercise Time (min): 11:31 METS: 13.7   Predicted Max HR: 157 bpm % Max HR: 85.35 bpm Rate Pressure Product: 52841   Dose of Adenosine (mg):  n/a Dose of Lexiscan: n/a mg  Dose of Atropine (mg): n/a Dose of Dobutamine: n/a mcg/kg/min (at max HR)  Stress Test Technologist: Smiley Houseman, CMA-N  Nuclear Technologist:  Domenic Polite, CNMT     Rest Procedure:  Myocardial perfusion imaging was performed at rest 45 minutes following the intravenous administration of Technetium 33m Tetrofosmin.  Rest ECG: No acute changes.  Stress Procedure:  The patient exercised for 11:31 on the treadmill utilizing the Bruce protocol.  The patient stopped due to fatigue and denied any chest pain.   There were no significant ST-T wave changes.  Technetium 29m Tetrofosmin was injected at peak exercise and myocardial perfusion imaging was performed after a brief delay.  Stress ECG: No significant change from baseline ECG  QPS Raw Data Images:  Normal; no motion artifact; normal heart/lung ratio. Stress Images:  Normal homogeneous uptake in all areas of the myocardium. Rest Images:  Normal homogeneous uptake in all areas of the myocardium. Subtraction (SDS):  No evidence of ischemia. Transient Ischemic Dilatation (Normal <1.22):  .82 Lung/Heart Ratio (Normal <0.45):  .32  Quantitative Gated Spect Images QGS EDV:  79 ml QGS ESV:  24 ml QGS cine images:  NL LV Function; NL Wall Motion QGS EF: 69%  Impression Exercise Capacity:  Excellent exercise capacity. BP Response:  Normal blood pressure response. Clinical Symptoms:  No chest pain. ECG Impression:  No significant ST segment change suggestive of ischemia. Comparison with Prior Nuclear Study: No significant change since 01/20/03  Overall Impression:  Normal stress nuclear study.    Cassell Clement

## 2010-07-19 NOTE — Progress Notes (Signed)
nuc med report routed to Dr. Jonny Ruiz 07/18/10 Domenic Polite

## 2010-07-20 ENCOUNTER — Encounter: Payer: Self-pay | Admitting: Internal Medicine

## 2010-09-05 ENCOUNTER — Encounter: Payer: Self-pay | Admitting: Internal Medicine

## 2010-09-28 ENCOUNTER — Encounter: Payer: Self-pay | Admitting: Internal Medicine

## 2010-09-28 ENCOUNTER — Ambulatory Visit (INDEPENDENT_AMBULATORY_CARE_PROVIDER_SITE_OTHER): Payer: 59 | Admitting: Internal Medicine

## 2010-09-28 VITALS — BP 110/80 | HR 87 | Temp 97.8°F | Ht 73.0 in | Wt 188.2 lb

## 2010-09-28 DIAGNOSIS — J309 Allergic rhinitis, unspecified: Secondary | ICD-10-CM

## 2010-09-28 DIAGNOSIS — Z0000 Encounter for general adult medical examination without abnormal findings: Secondary | ICD-10-CM

## 2010-09-28 DIAGNOSIS — Z2911 Encounter for prophylactic immunotherapy for respiratory syncytial virus (RSV): Secondary | ICD-10-CM

## 2010-09-28 DIAGNOSIS — Z23 Encounter for immunization: Secondary | ICD-10-CM

## 2010-09-28 NOTE — Assessment & Plan Note (Signed)

## 2010-09-28 NOTE — Patient Instructions (Addendum)
You had the tetanus shot today Please return if you change your mind about the flu shot Please have the pharmacy call if you need refills You had the shingles shot today Please return in 1 year for your yearly visit, or sooner if needed, with Lab testing done 3-5 days before

## 2010-09-28 NOTE — Progress Notes (Signed)
Subjective:    Patient ID: Jason Robles, male    DOB: May 11, 1945, 65 y.o.   MRN: [id number]  HPI  Here for wellness and f/u;  Overall doing ok;  Pt denies CP, worsening SOB, DOE, wheezing, orthopnea, PND, worsening LE edema, palpitations, dizziness or syncope.  Pt denies neurological change such as new Headache, facial or extremity weakness.  Pt denies polydipsia, polyuria, or low sugar symptoms. Pt states overall good compliance with treatment and medications, good tolerability, and trying to follow lower cholesterol diet.  Pt denies worsening depressive symptoms, suicidal ideation or panic. No fever, wt loss, night sweats, loss of appetite, or other constitutional symptoms.  Pt states good ability with ADL's, low fall risk, home safety reviewed and adequate, no significant changes in hearing or vision, and occasionally active with exercise. Still having occasional nocuturia on the flomax. Does have several wks ongoing nasal allergy symptoms with clear congestion, itch and sneeze, without fever, pain, ST, cough or wheezing. Past Medical History  Diagnosis Date  . ABDOMINAL PAIN OTHER SPECIFIED SITE 04/30/2008  . ALLERGIC RHINITIS 08/25/2007  . BENIGN PROSTATIC HYPERTROPHY 08/21/2006  . COLONIC POLYPS, HX OF 04/18/2007  . 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 Diverticulosis of colon (without mention of hemorrhage) 04/18/2007  . EXTERNAL HEMORRHOIDS 04/18/2007  . Headache 09/20/2009  . HEARING LOSS, 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  . HYPERLIPIDEMIA 08/21/2006  . INSOMNIA-SLEEP DISORDER-UNSPEC 09/20/2009  . NONSPECIFIC ABN FINDING RAD & OTH EXAM GU ORGAN 05/03/2008  . PERIPHERAL VASCULAR DISEASE 08/25/2007  . Raynaud's syndrome 08/21/2006  . Anxiety 06/28/2010  . Chest pain 06/28/2010   Past Surgical History  Procedure Date  . Appendectomy   . S/p knee surgery     x's 4 since 1980  . S/p left shoulder 2002    rotator cuff  . S/p lumbar disc surgery 2007    reports that he has been  smoking.  He does not have any smokeless tobacco history on file. He reports that he drinks alcohol. His drug history not on file. family history includes Diabetes in his maternal uncle and mother and Kidney cancer in his brother. Allergies  Allergen Reactions  . Iohexol      Desc: PT developed itching post 125cc Omni injection. Treated w/ 50 mg PO benedryl and seen by Dr Tedra Senegal prior to release., Onset Date: 29562130    Current Outpatient Prescriptions on File Prior to Visit  Medication Sig Dispense Refill  . aspirin 81 MG tablet Take 81 mg by mouth daily.        . meloxicam (MOBIC) 15 MG tablet Take 15 mg by mouth daily.        . rosuvastatin (CRESTOR) 20 MG tablet Take 20 mg by mouth daily.        . Tamsulosin HCl (FLOMAX) 0.4 MG CAPS Take 1 capsule (0.4 mg total) by mouth daily.  30 capsule  11   Review of Systems Review of Systems  Constitutional: Negative for diaphoresis, activity change, appetite change and unexpected weight change.  HENT: Negative for hearing loss, ear pain, facial swelling, mouth sores and neck stiffness.   Eyes: Negative for pain, redness and visual disturbance.  Respiratory: Negative for shortness of breath and wheezing.   Cardiovascular: Negative for chest pain and palpitations.  Gastrointestinal: Negative for diarrhea, blood in stool, abdominal distention and rectal pain.  Genitourinary: Negative for hematuria, flank pain and decreased urine volume.  Musculoskeletal: Negative for myalgias and joint swelling.  Skin: Negative for color change  and wound.  Neurological: Negative for syncope and numbness.  Hematological: Negative for adenopathy.  Psychiatric/Behavioral: Negative for hallucinations, self-injury, decreased concentration and agitation.      Objective:   Physical Exam BP 110/80  Pulse 87  Temp(Src) 97.8 F (36.6 C) (Oral)  Ht 6\' 1"  (1.854 m)  Wt 188 lb 4 oz (85.39 kg)  BMI 24.84 kg/m2  SpO2 97% Physical Exam  VS noted Constitutional: Pt  is oriented to person, place, and time. Appears well-developed and well-nourished.  HENT:  Head: Normocephalic and atraumatic.  Right Ear: External ear normal.  Left Ear: External ear normal.  Nose: Nose normal.  Mouth/Throat: Oropharynx is clear and moist.  Bilat tm's mild erythema.  Sinus nontender.  Pharynx mild erythema Eyes: Conjunctivae and EOM are normal. Pupils are equal, round, and reactive to light.  Neck: Normal range of motion. Neck supple. No JVD present. No tracheal deviation present.  Cardiovascular: Normal rate, regular rhythm, normal heart sounds and intact distal pulses.   Pulmonary/Chest: Effort normal and breath sounds normal.  Abdominal: Soft. Bowel sounds are normal. There is no tenderness.  Musculoskeletal: Normal range of motion. Exhibits no edema.  Lymphadenopathy:  Has no cervical adenopathy.  Neurological: Pt is alert and oriented to person, place, and time. Pt has normal reflexes. No cranial nerve deficit.  Skin: Skin is warm and dry. No rash noted.  Psychiatric:  Has  normal mood and affect. Behavior is normal.     Assessment & Plan:

## 2010-09-30 ENCOUNTER — Encounter: Payer: Self-pay | Admitting: Internal Medicine

## 2010-09-30 NOTE — Assessment & Plan Note (Addendum)
Mild to mod, for allegra otc prn,  to f/u any worsening symptoms or concerns 

## 2010-10-28 ENCOUNTER — Other Ambulatory Visit: Payer: Self-pay | Admitting: Internal Medicine

## 2010-11-24 ENCOUNTER — Other Ambulatory Visit: Payer: Self-pay | Admitting: Internal Medicine

## 2010-12-24 ENCOUNTER — Other Ambulatory Visit: Payer: Self-pay | Admitting: Internal Medicine

## 2010-12-25 NOTE — Telephone Encounter (Signed)
Faxed hardcopy to pharmacy. 

## 2011-08-02 ENCOUNTER — Encounter: Payer: Self-pay | Admitting: Gastroenterology

## 2011-08-07 ENCOUNTER — Other Ambulatory Visit: Payer: Self-pay

## 2011-08-07 MED ORDER — ZOLPIDEM TARTRATE 10 MG PO TABS: 10.0000 mg | ORAL_TABLET | Freq: Every evening | ORAL | Status: DC | PRN

## 2011-08-07 NOTE — Telephone Encounter (Signed)
Done hardcopy to robin  

## 2011-08-08 NOTE — Telephone Encounter (Signed)
Faxed hardcopy to pharmacy. 

## 2011-09-28 ENCOUNTER — Ambulatory Visit: Payer: 59 | Admitting: Internal Medicine

## 2011-09-28 DIAGNOSIS — Z0289 Encounter for other administrative examinations: Secondary | ICD-10-CM

## 2011-10-15 ENCOUNTER — Other Ambulatory Visit: Payer: Self-pay

## 2011-10-15 MED ORDER — MELOXICAM 15 MG PO TABS: 15.0000 mg | ORAL_TABLET | Freq: Every day | ORAL | Status: DC

## 2012-03-14 ENCOUNTER — Other Ambulatory Visit: Payer: Self-pay | Admitting: Internal Medicine

## 2012-03-26 ENCOUNTER — Ambulatory Visit (INDEPENDENT_AMBULATORY_CARE_PROVIDER_SITE_OTHER): Payer: 59

## 2012-03-26 ENCOUNTER — Ambulatory Visit (INDEPENDENT_AMBULATORY_CARE_PROVIDER_SITE_OTHER): Payer: 59 | Admitting: Internal Medicine

## 2012-03-26 ENCOUNTER — Encounter: Payer: Self-pay | Admitting: Internal Medicine

## 2012-03-26 VITALS — BP 110/62 | HR 77 | Temp 98.2°F | Ht 72.0 in | Wt 185.8 lb

## 2012-03-26 DIAGNOSIS — M6281 Muscle weakness (generalized): Secondary | ICD-10-CM | POA: Insufficient documentation

## 2012-03-26 DIAGNOSIS — Z0000 Encounter for general adult medical examination without abnormal findings: Secondary | ICD-10-CM

## 2012-03-26 LAB — URINALYSIS, ROUTINE W REFLEX MICROSCOPIC
Ketones, ur: NEGATIVE
Leukocytes, UA: NEGATIVE
Nitrite: NEGATIVE
Specific Gravity, Urine: 1.025 (ref 1.000–1.030)
Urobilinogen, UA: 0.2 (ref 0.0–1.0)
pH: 6 (ref 5.0–8.0)

## 2012-03-26 LAB — HEPATIC FUNCTION PANEL
AST: 23 U/L (ref 0–37)
Albumin: 4.2 g/dL (ref 3.5–5.2)
Alkaline Phosphatase: 65 U/L (ref 39–117)
Total Protein: 7 g/dL (ref 6.0–8.3)

## 2012-03-26 LAB — CBC WITH DIFFERENTIAL/PLATELET
Basophils Absolute: 0 10*3/uL (ref 0.0–0.1)
Eosinophils Relative: 1.9 % (ref 0.0–5.0)
Lymphocytes Relative: 34.1 % (ref 12.0–46.0)
Monocytes Relative: 8.1 % (ref 3.0–12.0)
Neutrophils Relative %: 55.7 % (ref 43.0–77.0)
Platelets: 200 10*3/uL (ref 150.0–400.0)
RDW: 13.6 % (ref 11.5–14.6)
WBC: 6.3 10*3/uL (ref 4.5–10.5)

## 2012-03-26 LAB — PSA: PSA: 4.06 ng/mL — ABNORMAL HIGH (ref 0.10–4.00)

## 2012-03-26 LAB — BASIC METABOLIC PANEL
BUN: 23 mg/dL (ref 6–23)
CO2: 31 mEq/L (ref 19–32)
GFR: 77.07 mL/min (ref >60.00)
Glucose, Bld: 105 mg/dL — ABNORMAL HIGH (ref 70–99)
Potassium: 5 mEq/L (ref 3.5–5.1)
Sodium: 141 mEq/L (ref 135–145)

## 2012-03-26 LAB — LIPID PANEL
HDL: 45 mg/dL (ref >39.00)
Triglycerides: 239 mg/dL — ABNORMAL HIGH (ref 0.0–149.0)

## 2012-03-26 LAB — TESTOSTERONE: Testosterone: 312.17 ng/dL — ABNORMAL LOW (ref 350.00–890.00)

## 2012-03-26 MED ORDER — ROSUVASTATIN CALCIUM 20 MG PO TABS: ORAL_TABLET | ORAL | Status: DC

## 2012-03-26 MED ORDER — MELOXICAM 15 MG PO TABS: 15.0000 mg | ORAL_TABLET | Freq: Every day | ORAL | Status: DC

## 2012-03-26 MED ORDER — ZOLPIDEM TARTRATE 10 MG PO TABS: 10.0000 mg | ORAL_TABLET | Freq: Every evening | ORAL | Status: DC | PRN

## 2012-03-26 NOTE — Progress Notes (Signed)
Subjective:    Patient ID: Jason Robles, male    DOB: 1945/04/18, 67 y.o.   MRN: [id number]  HPI  Here for wellness and f/u;  Overall doing ok;  Pt denies CP, worsening SOB, DOE, wheezing, orthopnea, PND, worsening LE edema, palpitations, dizziness or syncope.  Pt denies neurological change such as new headache, facial or extremity weakness.  Pt denies polydipsia, polyuria, or low sugar symptoms. Pt states overall good compliance with treatment and medications, good tolerability, and has been trying to follow lower cholesterol diet.  Pt denies worsening depressive symptoms, suicidal ideation or panic. No fever, night sweats, wt loss, loss of appetite, or other constitutional symptoms.  Pt states good ability with ADL's, has low fall risk, home safety reviewed and adequate, no other significant changes in hearing or vision, and only occasionally active with exercise.  Also mentions reduction overall in muscle mass in the past yr, asks for testosterone level.  Also with mild medial epicondylar tender, works his Publishing rights manager business.  Also mentions mild transient AM numbness to either right or left shoulder whichever he lies on night.  Needs med refills Past Medical History  Diagnosis Date  . ABDOMINAL PAIN OTHER SPECIFIED SITE 04/30/2008  . ALLERGIC RHINITIS 08/25/2007  . BENIGN PROSTATIC HYPERTROPHY 08/21/2006  . COLONIC POLYPS, HX OF 04/18/2007  . 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 Diverticulosis of colon (without mention of hemorrhage) 04/18/2007  . EXTERNAL HEMORRHOIDS 04/18/2007  . Headache 09/20/2009  . HEARING LOSS, 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  . HYPERLIPIDEMIA 08/21/2006  . INSOMNIA-SLEEP DISORDER-UNSPEC 09/20/2009  . NONSPECIFIC ABN FINDING RAD & OTH EXAM GU ORGAN 05/03/2008  . PERIPHERAL VASCULAR DISEASE 08/25/2007  . Raynaud's syndrome 08/21/2006  . Anxiety 06/28/2010  . Chest pain 06/28/2010   Past Surgical History  Procedure Laterality Date  . Appendectomy    . S/p knee  surgery      x's 4 since 1980  . S/p left shoulder  2002    rotator cuff  . S/p lumbar disc surgery  2007    reports that he has been smoking.  He does not have any smokeless tobacco history on file. He reports that  drinks alcohol. His drug history is not on file. family history includes Diabetes in his maternal uncle and mother and Kidney cancer in his brother. Allergies  Allergen Reactions  . Iohexol      Desc: PT developed itching post 125cc Omni injection. Treated w/ 50 mg PO benedryl and seen by Dr Tedra Senegal prior to release., Onset Date: [id number]    Current Outpatient Prescriptions on File Prior to Visit  Medication Sig Dispense Refill  . aspirin 81 MG tablet Take 81 mg by mouth daily.        . Tamsulosin HCl (FLOMAX) 0.4 MG CAPS Take 1 capsule (0.4 mg total) by mouth daily.  30 capsule  11   No current facility-administered medications on file prior to visit.   Review of Systems Constitutional: Negative for diaphoresis, activity change, appetite change or unexpected weight change.  HENT: Negative for hearing loss, ear pain, facial swelling, mouth sores and neck stiffness.   Eyes: Negative for pain, redness and visual disturbance.  Respiratory: Negative for shortness of breath and wheezing.   Cardiovascular: Negative for chest pain and palpitations.  Gastrointestinal: Negative for diarrhea, blood in stool, abdominal distention or other pain Genitourinary: Negative for hematuria, flank pain or change in urine volume.  Musculoskeletal: Negative for myalgias and joint swelling.  Skin: Negative for color  change and wound.  Neurological: Negative for syncope and numbness. other than noted Hematological: Negative for adenopathy.  Psychiatric/Behavioral: Negative for hallucinations, self-injury, decreased concentration and agitation.      Objective:   Physical Exam BP 110/62  Pulse 77  Temp(Src) 98.2 F (36.8 C) (Oral)  Ht 6' (1.829 m)  Wt 185 lb 12 oz (84.256 kg)  BMI 25.19  kg/m2  SpO2 94% VS noted,  Constitutional: Pt is oriented to person, place, and time. Appears well-developed and well-nourished.  Head: Normocephalic and atraumatic.  Right Ear: External ear normal.  Left Ear: External ear normal.  Nose: Nose normal.  Mouth/Throat: Oropharynx is clear and moist.  Eyes: Conjunctivae and EOM are normal. Pupils are equal, round, and reactive to light.  Neck: Normal range of motion. Neck supple. No JVD present. No tracheal deviation present.  Cardiovascular: Normal rate, regular rhythm, normal heart sounds and intact distal pulses.   Pulmonary/Chest: Effort normal and breath sounds normal.  Abdominal: Soft. Bowel sounds are normal. There is no tenderness. No HSM  Musculoskeletal: Normal range of motion. Exhibits no edema.  Lymphadenopathy:  Has no cervical adenopathy.  Neurological: Pt is alert and oriented to person, place, and time. Pt has normal reflexes. No cranial nerve deficit. Motor/dtr intact Mild medial epicondylar tender only, no swelling Skin: Skin is warm and dry. No rash noted.  Psychiatric:  Has  normal mood and affect. Behavior is normal.     Assessment & Plan:

## 2012-03-26 NOTE — Assessment & Plan Note (Signed)
For testosterone level 

## 2012-03-26 NOTE — Patient Instructions (Addendum)
Please continue all other medications as before, and refills have been done if requested. Please go to the LAB in the Basement (turn left off the elevator) for the tests to be done at your convenience You will be contacted by phone if any changes need to be made immediately.  Otherwise, you will receive a letter about your results with an explanation, but please check with MyChart first. Thank you for enrolling in MyChart. Please follow the instructions below to securely access your online medical record. MyChart allows you to send messages to your doctor, view your test results, renew your prescriptions, schedule appointments, and more. To Log into My Chart online, please go by [employer] or [employer] to [email or web address], or download the MyChart App from the [employer] of [employer] .  Your Username is:  [phone] (pass [email or web address]) Please send a practice Message on Mychart later today. Please return in 1 year for your yearly visit, or sooner if needed, with Lab testing done 3-5 days before

## 2012-03-26 NOTE — Assessment & Plan Note (Signed)

## 2012-03-27 ENCOUNTER — Other Ambulatory Visit: Payer: Self-pay | Admitting: Internal Medicine

## 2012-03-27 DIAGNOSIS — R972 Elevated prostate specific antigen [PSA]: Secondary | ICD-10-CM

## 2012-03-27 LAB — TSH: TSH: 1.65 u[IU]/mL (ref 0.35–5.50)

## 2012-03-27 LAB — LDL CHOLESTEROL, DIRECT: Direct LDL: 94.2 mg/dL

## 2012-05-02 ENCOUNTER — Encounter (HOSPITAL_BASED_OUTPATIENT_CLINIC_OR_DEPARTMENT_OTHER): Payer: Self-pay | Admitting: Family Medicine

## 2012-05-02 ENCOUNTER — Emergency Department (HOSPITAL_BASED_OUTPATIENT_CLINIC_OR_DEPARTMENT_OTHER): Payer: Medicare Other

## 2012-05-02 ENCOUNTER — Observation Stay (HOSPITAL_BASED_OUTPATIENT_CLINIC_OR_DEPARTMENT_OTHER)
Admission: EM | Admit: 2012-05-02 | Discharge: 2012-05-04 | Disposition: A | Discharge status: Home / Self Care | Payer: Medicare Other | Attending: Internal Medicine | Admitting: Internal Medicine

## 2012-05-02 DIAGNOSIS — I7300 Raynaud's syndrome without gangrene: Secondary | ICD-10-CM | POA: Insufficient documentation

## 2012-05-02 DIAGNOSIS — H9190 Unspecified hearing loss, unspecified ear: Secondary | ICD-10-CM

## 2012-05-02 DIAGNOSIS — R9389 Abnormal findings on diagnostic imaging of other specified body structures: Secondary | ICD-10-CM

## 2012-05-02 DIAGNOSIS — G459 Transient cerebral ischemic attack, unspecified: Secondary | ICD-10-CM

## 2012-05-02 DIAGNOSIS — G4700 Insomnia, unspecified: Secondary | ICD-10-CM

## 2012-05-02 DIAGNOSIS — E785 Hyperlipidemia, unspecified: Secondary | ICD-10-CM | POA: Insufficient documentation

## 2012-05-02 DIAGNOSIS — I739 Peripheral vascular disease, unspecified: Secondary | ICD-10-CM

## 2012-05-02 DIAGNOSIS — F419 Anxiety disorder, unspecified: Secondary | ICD-10-CM

## 2012-05-02 DIAGNOSIS — Z86010 Personal history of colonic polyps: Secondary | ICD-10-CM

## 2012-05-02 DIAGNOSIS — IMO0002 Reserved for concepts with insufficient information to code with codable children: Secondary | ICD-10-CM

## 2012-05-02 DIAGNOSIS — M6281 Muscle weakness (generalized): Secondary | ICD-10-CM

## 2012-05-02 DIAGNOSIS — H532 Diplopia: Secondary | ICD-10-CM | POA: Insufficient documentation

## 2012-05-02 DIAGNOSIS — R55 Syncope and collapse: Principal | ICD-10-CM | POA: Diagnosis present

## 2012-05-02 DIAGNOSIS — N400 Enlarged prostate without lower urinary tract symptoms: Secondary | ICD-10-CM

## 2012-05-02 DIAGNOSIS — Z0000 Encounter for general adult medical examination without abnormal findings: Secondary | ICD-10-CM

## 2012-05-02 DIAGNOSIS — M1710 Unilateral primary osteoarthritis, unspecified knee: Secondary | ICD-10-CM

## 2012-05-02 DIAGNOSIS — K644 Residual hemorrhoidal skin tags: Secondary | ICD-10-CM

## 2012-05-02 DIAGNOSIS — R51 Headache: Secondary | ICD-10-CM

## 2012-05-02 DIAGNOSIS — J309 Allergic rhinitis, unspecified: Secondary | ICD-10-CM

## 2012-05-02 DIAGNOSIS — K5730 Diverticulosis of large intestine without perforation or abscess without bleeding: Secondary | ICD-10-CM

## 2012-05-02 HISTORY — DX: Syncope and collapse: R55

## 2012-05-02 LAB — HEMOGLOBIN A1C
Hgb A1c MFr Bld: 5.6 % (ref <5.7)
Mean Plasma Glucose: 114 mg/dL (ref <117)

## 2012-05-02 LAB — CBC
MCH: 29.9 pg (ref 26.0–34.0)
MCV: 85.5 fL (ref 78.0–100.0)
Platelets: 132 10*3/uL — ABNORMAL LOW (ref 150–400)
RDW: 13.3 % (ref 11.5–15.5)
WBC: 5.5 10*3/uL (ref 4.0–10.5)

## 2012-05-02 LAB — CBC WITH DIFFERENTIAL/PLATELET
Basophils Absolute: 0 10*3/uL (ref 0.0–0.1)
Basophils Relative: 0 % (ref 0–1)
HCT: 43.2 % (ref 39.0–52.0)
Lymphocytes Relative: 25 % (ref 12–46)
MCHC: 33.8 g/dL (ref 30.0–36.0)
Monocytes Absolute: 0.3 10*3/uL (ref 0.1–1.0)
Neutro Abs: 3.5 10*3/uL (ref 1.7–7.7)
Neutrophils Relative %: 68 % (ref 43–77)
Platelets: 140 10*3/uL — ABNORMAL LOW (ref 150–400)
RDW: 13.4 % (ref 11.5–15.5)
WBC: 5.2 10*3/uL (ref 4.0–10.5)

## 2012-05-02 LAB — URINALYSIS, ROUTINE W REFLEX MICROSCOPIC
Ketones, ur: NEGATIVE mg/dL
Leukocytes, UA: NEGATIVE
Nitrite: NEGATIVE
Protein, ur: NEGATIVE mg/dL
pH: 7.5 (ref 5.0–8.0)

## 2012-05-02 LAB — TROPONIN I: Troponin I: <0.3 ng/mL (ref <0.30)

## 2012-05-02 LAB — RAPID URINE DRUG SCREEN, HOSP PERFORMED
Cocaine: NOT DETECTED
Opiates: NOT DETECTED

## 2012-05-02 LAB — BASIC METABOLIC PANEL
Chloride: 101 mEq/L (ref 96–112)
Creatinine, Ser: 1.1 mg/dL (ref 0.50–1.35)
GFR calc Af Amer: 80 mL/min — ABNORMAL LOW (ref >90)
Sodium: 139 mEq/L (ref 135–145)

## 2012-05-02 LAB — CREATININE, SERUM: Creatinine, Ser: 0.93 mg/dL (ref 0.50–1.35)

## 2012-05-02 LAB — GLUCOSE, CAPILLARY: Glucose-Capillary: 88 mg/dL (ref 70–99)

## 2012-05-02 MED ORDER — LORAZEPAM 2 MG/ML IJ SOLN: 1.0000 mg | Freq: Four times a day (QID) | INTRAMUSCULAR | Status: DC | PRN

## 2012-05-02 MED ORDER — ZOLPIDEM TARTRATE 5 MG PO TABS: 10.0000 mg | ORAL_TABLET | Freq: Every evening | ORAL | Status: DC | PRN

## 2012-05-02 MED ORDER — SODIUM CHLORIDE 0.9 % IV SOLN: 250.0000 mL | INTRAVENOUS | Status: DC | PRN

## 2012-05-02 MED ORDER — ACETAMINOPHEN 325 MG PO TABS
650.0000 mg | ORAL_TABLET | Freq: Three times a day (TID) | ORAL | Status: DC
  Administered 2012-05-02 – 2012-05-03 (×2): 650 mg via ORAL
  Filled 2012-05-02 (×2): qty 2

## 2012-05-02 MED ORDER — ASPIRIN 81 MG PO TABS: 81.0000 mg | ORAL_TABLET | Freq: Every day | ORAL | Status: DC

## 2012-05-02 MED ORDER — SODIUM CHLORIDE 0.9 % IJ SOLN: 3.0000 mL | Freq: Two times a day (BID) | INTRAMUSCULAR | Status: DC

## 2012-05-02 MED ORDER — ADULT MULTIVITAMIN W/MINERALS CH
1.0000 | ORAL_TABLET | Freq: Every day | ORAL | Status: DC
  Administered 2012-05-02 – 2012-05-04 (×3): 1 via ORAL
  Filled 2012-05-02 (×3): qty 1

## 2012-05-02 MED ORDER — FOLIC ACID 1 MG PO TABS
1.0000 mg | ORAL_TABLET | Freq: Every day | ORAL | Status: DC
  Administered 2012-05-02 – 2012-05-04 (×3): 1 mg via ORAL
  Filled 2012-05-02 (×3): qty 1

## 2012-05-02 MED ORDER — ASPIRIN EC 81 MG PO TBEC
81.0000 mg | DELAYED_RELEASE_TABLET | Freq: Every day | ORAL | Status: DC
  Administered 2012-05-03 – 2012-05-04 (×2): 81 mg via ORAL
  Filled 2012-05-02 (×2): qty 1

## 2012-05-02 MED ORDER — ATORVASTATIN CALCIUM 40 MG PO TABS
40.0000 mg | ORAL_TABLET | Freq: Every day | ORAL | Status: DC
  Administered 2012-05-03: 40 mg via ORAL
  Filled 2012-05-02 (×2): qty 1

## 2012-05-02 MED ORDER — HEPARIN SODIUM (PORCINE) 5000 UNIT/ML IJ SOLN
5000.0000 [IU] | Freq: Three times a day (TID) | INTRAMUSCULAR | Status: DC
  Administered 2012-05-02 – 2012-05-04 (×5): 5000 [IU] via SUBCUTANEOUS
  Filled 2012-05-02 (×8): qty 1

## 2012-05-02 MED ORDER — ACETAMINOPHEN ER 650 MG PO TBCR: 650.0000 mg | EXTENDED_RELEASE_TABLET | Freq: Three times a day (TID) | ORAL | Status: DC

## 2012-05-02 MED ORDER — LORAZEPAM 1 MG PO TABS
1.0000 mg | ORAL_TABLET | Freq: Four times a day (QID) | ORAL | Status: DC | PRN
  Administered 2012-05-02: 1 mg via ORAL
  Filled 2012-05-02: qty 1

## 2012-05-02 MED ORDER — VITAMIN B-1 100 MG PO TABS
100.0000 mg | ORAL_TABLET | Freq: Every day | ORAL | Status: DC
  Administered 2012-05-02 – 2012-05-04 (×3): 100 mg via ORAL
  Filled 2012-05-02 (×3): qty 1

## 2012-05-02 MED ORDER — SODIUM CHLORIDE 0.9 % IJ SOLN
3.0000 mL | Freq: Two times a day (BID) | INTRAMUSCULAR | Status: DC
  Administered 2012-05-02 – 2012-05-03 (×2): 3 mL via INTRAVENOUS

## 2012-05-02 MED ORDER — ONDANSETRON HCL 4 MG PO TABS: 4.0000 mg | ORAL_TABLET | Freq: Four times a day (QID) | ORAL | Status: DC | PRN

## 2012-05-02 MED ORDER — SODIUM CHLORIDE 0.9 % IV BOLUS (SEPSIS)
1000.0000 mL | Freq: Once | INTRAVENOUS | Status: AC
  Administered 2012-05-02: 500 mL via INTRAVENOUS

## 2012-05-02 MED ORDER — THIAMINE HCL 100 MG/ML IJ SOLN
100.0000 mg | Freq: Every day | INTRAMUSCULAR | Status: DC
  Filled 2012-05-02 (×3): qty 1

## 2012-05-02 MED ORDER — ONDANSETRON HCL 4 MG/2ML IJ SOLN: 4.0000 mg | Freq: Four times a day (QID) | INTRAMUSCULAR | Status: DC | PRN

## 2012-05-02 MED ORDER — SENNOSIDES-DOCUSATE SODIUM 8.6-50 MG PO TABS
1.0000 | ORAL_TABLET | Freq: Every evening | ORAL | Status: DC | PRN
  Filled 2012-05-02: qty 1

## 2012-05-02 MED ORDER — MELOXICAM 15 MG PO TABS
15.0000 mg | ORAL_TABLET | Freq: Every day | ORAL | Status: DC
  Administered 2012-05-03 – 2012-05-04 (×2): 15 mg via ORAL
  Filled 2012-05-02 (×3): qty 1

## 2012-05-02 MED ORDER — SODIUM CHLORIDE 0.9 % IJ SOLN: 3.0000 mL | INTRAMUSCULAR | Status: DC | PRN

## 2012-05-02 MED ORDER — ATORVASTATIN CALCIUM 40 MG PO TABS
40.0000 mg | ORAL_TABLET | Freq: Every day | ORAL | Status: DC
  Filled 2012-05-02: qty 1

## 2012-05-02 NOTE — Progress Notes (Signed)
Sent text page to Dr. Sherrie Mustache to make him aware of pts arrival to unit 2000

## 2012-05-02 NOTE — ED Provider Notes (Signed)
History     CSN: [id number]  Arrival date & time 05/02/12  1017   First MD Initiated Contact with Patient 05/02/12 1037      Chief Complaint  Patient presents with  . Loss of Consciousness    (Consider location/radiation/quality/duration/timing/severity/associated sxs/prior treatment) Patient is a 67 y.o. male presenting with syncope.  Loss of Consciousness    Pt brought to the ED by wife. He reports he woke up this AM in his normal state of health and worked out at [employer] (primary lifting weights, no cardio). He was at home later in the morning when he sat down on chair in his den. He states he does not remember what happened afterwards. Wife states she found him unresponsive, unable to get him to respond for about . He did not have any witnessed seizure activity or incontinence. EMS was called and he began to return to baseline. He refused transport at that time, so wife brought him here for eval. He states while en route he was having horizontal double vision, but that has also since resolved.   Past Medical History  Diagnosis Date  . ABDOMINAL PAIN OTHER SPECIFIED SITE 04/30/2008  . ALLERGIC RHINITIS 08/25/2007  . BENIGN PROSTATIC HYPERTROPHY 08/21/2006  . COLONIC POLYPS, HX OF 04/18/2007  . 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 Diverticulosis of colon (without mention of hemorrhage) 04/18/2007  . EXTERNAL HEMORRHOIDS 04/18/2007  . Headache 09/20/2009  . HEARING LOSS, 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  . HYPERLIPIDEMIA 08/21/2006  . INSOMNIA-SLEEP DISORDER-UNSPEC 09/20/2009  . NONSPECIFIC ABN FINDING RAD & OTH EXAM GU ORGAN 05/03/2008  . PERIPHERAL VASCULAR DISEASE 08/25/2007  . Raynaud's syndrome 08/21/2006  . Anxiety 06/28/2010  . Chest pain 06/28/2010    Past Surgical History  Procedure Laterality Date  . Appendectomy    . S/p knee surgery      x's 4 since 1980  . S/p left shoulder  2002    rotator cuff  . S/p lumbar disc surgery  2007    Family History  Problem  Relation Age of Onset  . Diabetes Mother   . Kidney cancer Brother   . Diabetes Maternal Uncle     History  Substance Use Topics  . Smoking status: Current Every Day Smoker  . Smokeless tobacco: Not on file  . Alcohol Use: Yes      Review of Systems  Cardiovascular: Positive for syncope.   All other systems reviewed and are negative except as noted in HPI.   Allergies  Iohexol  Home Medications   Current Outpatient Rx  Name  Route  Sig  Dispense  Refill  . aspirin 81 MG tablet   Oral   Take 81 mg by mouth daily.           . meloxicam (MOBIC) 15 MG tablet   Oral   Take 1 tablet (15 mg total) by mouth daily.   90 tablet   3     Patient needs to schedule an office visit for futu ...   . rosuvastatin (CRESTOR) 20 MG tablet      TAKE 1 TABLET BY MOUTH ONCE DAILY   90 tablet   3   . Tamsulosin HCl (FLOMAX) 0.4 MG CAPS   Oral   Take 1 capsule (0.4 mg total) by mouth daily.   30 capsule   11   . zolpidem (AMBIEN) 10 MG tablet   Oral   Take 1 tablet (10 mg total) by mouth at bedtime as  needed for sleep.   30 tablet   5     BP 116/76  Pulse 60  Resp 16  SpO2 97%  Physical Exam  Nursing note and vitals reviewed. Constitutional: He is oriented to person, place, and time. He appears well-developed and well-nourished.  HENT:  Head: Normocephalic and atraumatic.  Eyes: EOM are normal. Pupils are equal, round, and reactive to light.  Neck: Normal range of motion. Neck supple.  Cardiovascular: Normal rate, normal heart sounds and intact distal pulses.   Pulmonary/Chest: Effort normal and breath sounds normal.  Abdominal: Bowel sounds are normal. He exhibits no distension. There is no tenderness.  Musculoskeletal: Normal range of motion. He exhibits no edema and no tenderness.  Neurological: He is alert and oriented to person, place, and time. He has normal strength and normal reflexes. He displays no tremor. No cranial nerve deficit or sensory deficit.  Coordination normal.  Skin: Skin is warm and dry. No rash noted.  Psychiatric: He has a normal mood and affect.    ED Course  Procedures (including critical care time)  Labs Reviewed  CBC WITH DIFFERENTIAL - Abnormal; Notable for the following:    Platelets 140 (*)    All other components within normal limits  BASIC METABOLIC PANEL - Abnormal; Notable for the following:    BUN 25 (*)    GFR calc non Af Amer 69 (*)    GFR calc Af Amer 80 (*)    All other components within normal limits  GLUCOSE, CAPILLARY  URINALYSIS, ROUTINE W REFLEX MICROSCOPIC  TROPONIN I  PROTIME-INR  APTT   Dg Chest 2 View  05/02/2012  *RADIOLOGY REPORT*  Clinical Data: Syncope  CHEST - 2 VIEW  Comparison: 06/28/2010  Findings: Lung volumes are at the lower limits of normal, with mild crowding of the bronchovascular markings at the lung bases.  Right costophrenic angle is omitted from the field of view.  No focal pulmonary opacity.  No pleural effusion.  No pneumothorax.  No acute osseous finding.  IMPRESSION: No acute cardiopulmonary process.   Original Report Authenticated By: Christiana Pellant, M.D.    Ct Head Wo Contrast  05/02/2012  *RADIOLOGY REPORT*  Clinical Data: 67 year old male with syncope, double vision, lethargy, incoherent.  History of hearing loss.  CT HEAD WITHOUT CONTRAST  Technique:  Contiguous axial images were obtained from the base of the skull through the vertex without contrast.  Comparison: Brain MRI 07/11/2010.  Findings: Visualized paranasal sinuses and mastoids are clear.  No acute osseous abnormality identified.  Visualized orbits and scalp soft tissues are within normal limits.  Cerebral volume is within normal limits for age.  Cerebral volume is within normal limits for age.  No midline shift, ventriculomegaly, mass effect, evidence of mass lesion, intracranial hemorrhage or evidence of cortically based acute infarction.  Gray-white matter differentiation is within normal limits throughout the  brain.  No suspicious intracranial vascular hyperdensity.  IMPRESSION: Normal for age noncontrast CT appearance of the brain.   Original Report Authenticated By: Erskine Speed, M.D.      1. Syncope       MDM   Date: 05/02/2012  Rate: 60  Rhythm: normal sinus rhythm  QRS Axis: normal  Intervals: normal  ST/T Wave abnormalities: normal  Conduction Disutrbances:none  Narrative Interpretation:   Old EKG Reviewed: unchanged 2007    Syncope and diplopia from unclear cause. Back to baseline now. Not a tPA candidate due to resolution of symptoms and no definite neuro deficits.  Admit for further eval.        Charles B. Bernette Mayers, MD 05/02/12 1459

## 2012-05-02 NOTE — H&P (Signed)
[facility] History and Physical  Holland Nickson Hemsley UJW:[id number] DOB: [birth year] DOA: 05/02/2012  Referring physician: none PCP: Oliver Barre, MD  Specialists: Dr. Bernette Mayers  Chief Complaint: syncope  HPI: Jason Robles is a 67 y.o. male  Past medical history of hyperlipidemia, Raynaud's phenomena otherwise healthy and comes in for syncopal episode. He related this morning he was working out went home sat on the chair and passed out. [facility]. he did not have any prodromal symptoms, his wife found him pale and not sweating. He did not bite his tongue, did not lost control of the sphincters, he did not feel any palpitations, there were no seizure-like activities. When he woke up EMS decide him and he  Was feeling back to baseline.  In ED: Basic metabolic panel was within normal limits, his CBC showed no abnormality. UA did not show any signs of fixed flexion, chest x-ray showed no acute cardiopulmonary abnormality CT scan of the head showed no bleed, EKG shows normal sinus rhythm no T wave abnormalities.  Review of Systems: The patient denies anorexia, fever, weight loss,, vision loss, decreased hearing, hoarseness, chest pain, syncope, dyspnea on exertion, peripheral edema, balance deficits, hemoptysis, abdominal pain, melena, hematochezia, severe indigestion/heartburn, hematuria, incontinence, genital sores, muscle weakness, suspicious skin lesions, transient blindness, difficulty walking, depression, unusual weight change, abnormal bleeding, enlarged lymph nodes, angioedema, and breast masses.    Past Medical History  Diagnosis Date  . ABDOMINAL PAIN OTHER SPECIFIED SITE 04/30/2008  . ALLERGIC RHINITIS 08/25/2007  . BENIGN PROSTATIC HYPERTROPHY 08/21/2006  . COLONIC POLYPS, HX OF 04/18/2007  . 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 Diverticulosis of colon (without mention of hemorrhage) 04/18/2007  . EXTERNAL HEMORRHOIDS 04/18/2007  .  Headache 09/20/2009  . HEARING LOSS, 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  . HYPERLIPIDEMIA 08/21/2006  . INSOMNIA-SLEEP DISORDER-UNSPEC 09/20/2009  . NONSPECIFIC ABN FINDING RAD & OTH EXAM GU ORGAN 05/03/2008  . PERIPHERAL VASCULAR DISEASE 08/25/2007  . Raynaud's syndrome 08/21/2006  . Anxiety 06/28/2010  . Chest pain 06/28/2010   Past Surgical History  Procedure Laterality Date  . Appendectomy    . S/p knee surgery      x's 4 since 1980  . S/p left shoulder  2002    rotator cuff  . S/p lumbar disc surgery  2007   Social History:  reports that he has quit smoking. His smoking use included Cigarettes. He has a 15 pack-year smoking history. He has never used smokeless tobacco. He reports that he drinks about 8.4 ounces of alcohol per week. He reports that he does not use illicit drugs. Independent, lives at home  Allergies  Allergen Reactions  . Iohexol      Desc: PT developed itching post 125cc Omni injection. Treated w/ 50 mg PO benedryl and seen by Dr Tedra Senegal prior to release., Onset Date: [id number]     Family History  Problem Relation Age of Onset  . Diabetes Mother   . Alzheimer's disease Mother   . Kidney cancer Brother   . Diabetes Maternal Uncle     Prior to Admission medications   Medication Sig Start Date End Date Taking? Authorizing Provider  Acetaminophen (TYLENOL ARTHRITIS PAIN PO) Take 2 tablets by mouth daily as needed (body aches).   Yes Historical Provider, MD  aspirin 81 MG tablet Take 81 mg by mouth daily.     Yes Historical Provider, MD  meloxicam (MOBIC) 15 MG tablet Take 15 mg by mouth daily.  Yes Historical Provider, MD  rosuvastatin (CRESTOR) 20 MG tablet Take 10 mg by mouth daily.   Yes Historical Provider, MD  zolpidem (AMBIEN) 10 MG tablet Take 10 mg by mouth at bedtime as needed for sleep.   Yes Historical Provider, MD   Physical Exam: Filed Vitals:   05/02/12 1100 05/02/12 1212 05/02/12 1343 05/02/12 1502  BP:  122/70 122/72 132/79  Pulse:  61  91  Temp: 97.7 F  (36.5 C)   97.3 F (36.3 C)  TempSrc:    Oral  Resp:  18 18   Height:    6' (1.829 m)  Weight:    82.2 kg (181 lb 3.5 oz)  SpO2:  99% 100% 100%    BP 132/79  Pulse 91  Temp(Src) 97.3 F (36.3 C) (Oral)  Resp 18  Ht 6' (1.829 m)  Wt 82.2 kg (181 lb 3.5 oz)  BMI 24.57 kg/m2  SpO2 100%  General Appearance:    Alert, cooperative, no distress, appears stated age  Head:    Normocephalic, without obvious abnormality, atraumatic           Throat:   Lips, mucosa, and tongue normal; teeth and gums normal  Neck:   Supple, symmetrical, trachea midline, no adenopathy;       thyroid:  No enlargement/tenderness/nodules; no carotid   bruit or JVD  Back:     Symmetric, no curvature, ROM normal, no CVA tenderness  Lungs:     Clear to auscultation bilaterally, respirations unlabored  Chest wall:    No tenderness or deformity  Heart:    Regular rate and rhythm, S1 and S2 normal, no murmur, rub   or gallop  Abdomen:     Soft, non-tender, bowel sounds active all four quadrants,    no masses, no organomegaly        Extremities:   Extremities normal, atraumatic, no cyanosis or edema  Pulses:   2+ and symmetric all extremities  Skin:   Skin color, texture, turgor normal, no rashes or lesions  Lymph nodes:   Cervical, supraclavicular, and axillary nodes normal  Neurologic:   CNII-XII intact. Normal strength, sensation and reflexes      throughout    Labs on Admission:  Basic Metabolic Panel:  Recent Labs Lab 05/02/12 1030  NA 139  K 4.6  CL 101  CO2 31  GLUCOSE 82  BUN 25*  CREATININE 1.10  CALCIUM 10.3   Liver Function Tests: No results found for this basename: AST, ALT, ALKPHOS, BILITOT, PROT, ALBUMIN,  in the last 168 hours No results found for this basename: LIPASE, AMYLASE,  in the last 168 hours No results found for this basename: AMMONIA,  in the last 168 hours CBC:  Recent Labs Lab 05/02/12 1030  WBC 5.2  NEUTROABS 3.5  HGB 14.6  HCT 43.2  MCV 87.4  PLT 140*    Cardiac Enzymes:  Recent Labs Lab 05/02/12 1030  TROPONINI <0.30    BNP (last 3 results) No results found for this basename: PROBNP,  in the last 8760 hours CBG:  Recent Labs Lab 05/02/12 1024  GLUCAP 88    Radiological Exams on Admission: Dg Chest 2 View  05/02/2012  *RADIOLOGY REPORT*  Clinical Data: Syncope  CHEST - 2 VIEW  Comparison: 06/28/2010  Findings: Lung volumes are at the lower limits of normal, with mild crowding of the bronchovascular markings at the lung bases.  Right costophrenic angle is omitted from the field of view.  No focal  pulmonary opacity.  No pleural effusion.  No pneumothorax.  No acute osseous finding.  IMPRESSION: No acute cardiopulmonary process.   Original Report Authenticated By: Christiana Pellant, M.D.    Ct Head Wo Contrast  05/02/2012  *RADIOLOGY REPORT*  Clinical Data: 67 year old male with syncope, double vision, lethargy, incoherent.  History of hearing loss.  CT HEAD WITHOUT CONTRAST  Technique:  Contiguous axial images were obtained from the base of the skull through the vertex without contrast.  Comparison: Brain MRI 07/11/2010.  Findings: Visualized paranasal sinuses and mastoids are clear.  No acute osseous abnormality identified.  Visualized orbits and scalp soft tissues are within normal limits.  Cerebral volume is within normal limits for age.  Cerebral volume is within normal limits for age.  No midline shift, ventriculomegaly, mass effect, evidence of mass lesion, intracranial hemorrhage or evidence of cortically based acute infarction.  Gray-white matter differentiation is within normal limits throughout the brain.  No suspicious intracranial vascular hyperdensity.  IMPRESSION: Normal for age noncontrast CT appearance of the brain.   Original Report Authenticated By: Erskine Speed, M.D.     EKG: Independently reviewed. Sinus rhythm no T wave abnormality  Assessment/Plan Syncope: - This seems to be most likely vasovagal. On physical exam he  is neurological exam is intact, he relates no prodromal symptoms. His cardiopulmonary exam shows no chest deformities no appreciated murmurs rubs gallops.  I will go ahead and in minimal and observation under telemetry. We'll cycle his cardiac enzymes x3, we'll get a 2-D echo TSH and a B12 level.  - He does relate he drinks alcohol frequently. We'll go ahead and start him on thiamine and folate check and RBC folate and monitor him we'll see what. I don't think is contributing to his syncope. - He has had no change in his medication, or any addition of any medications. He admits to using marijuana occasionally. Uncle had and check a UDS.   HYPERLIPIDEMIA: - Continue statins   Code Status: full Family Communication: none Disposition Plan: observation  Time spent: 75 minutes  Marinda Elk [facility] Pager [phone]  If 7PM-7AM, please contact night-coverage [email or web address] Password [facility] 05/02/2012, 4:02 PM

## 2012-05-02 NOTE — ED Notes (Signed)
Pt c/o feeling "dizzy" and incoherent per wife this morning at approx 0830. Pt sts at present double vision has resolved and feels lethargic. Denies pain, dizziness, sob.

## 2012-05-02 NOTE — Progress Notes (Signed)
The patient is a 67 year old man who is being transferred from [facility]  for admission for a syncopal episode at home. When he came to, he complained of double vision. His PCP is Dr. Oliver Barre. He is hemodynamically stable. CT scan of his head reveals no acute disease. His initial troponin I. is normal. He is being admitted for workup of syncope.

## 2012-05-03 ENCOUNTER — Observation Stay (HOSPITAL_COMMUNITY): Payer: Medicare Other

## 2012-05-03 DIAGNOSIS — I7300 Raynaud's syndrome without gangrene: Secondary | ICD-10-CM

## 2012-05-03 LAB — TROPONIN I: Troponin I: <0.3 ng/mL (ref <0.30)

## 2012-05-03 NOTE — Progress Notes (Signed)
  Echocardiogram 2D Echocardiogram has been performed.  Jason Robles 05/03/2012, 12:18 PM

## 2012-05-03 NOTE — Progress Notes (Signed)
UR Completed Rylee Huestis Graves-Bigelow, RN,BSN [phone]  

## 2012-05-03 NOTE — Progress Notes (Addendum)
[facility] PROGRESS NOTE  Shante Maysonet Czajka ZOX:[id number] DOB: 18-Sep-1945 DOA: 05/02/2012 PCP: Oliver Barre, MD  Assessment/Plan: Syncope:  - tele: r/o CVA, MRI, no sign of seizures. On physical exam he is neurological exam is intact, he relates no prodromal symptoms.  cardiac enzymes x3,  2-D echo TSH ok and a B12 level.    HYPERLIPIDEMIA:  - Continue statins   Code Status: full Family Communication: patient Disposition Plan:    Consultants:  none  Procedures:  Echo:  Antibiotics:  none  HPI/Subjective: Feeling well No new c/o  Objective: Filed Vitals:   05/02/12 1343 05/02/12 1502 05/02/12 2008 05/03/12 0525  BP: 122/72 132/79 125/71 120/70  Pulse:  91 64 59  Temp:  97.3 F (36.3 C) 97.9 F (36.6 C) 97.3 F (36.3 C)  TempSrc:  Oral Oral Oral  Resp: 18  16 16   Height:  6' (1.829 m)    Weight:  82.2 kg (181 lb 3.5 oz)  81.874 kg (180 lb 8 oz)  SpO2: 100% 100% 97% 98%    Intake/Output Summary (Last 24 hours) at 05/03/12 0916 Last data filed at 05/02/12 1323  Gross per 24 hour  Intake   1240 ml  Output    300 ml  Net    940 ml   Filed Weights   05/02/12 1502 05/03/12 0525  Weight: 82.2 kg (181 lb 3.5 oz) 81.874 kg (180 lb 8 oz)    Exam:   General:  A+Ox3, NAD  Cardiovascular: rrr  Respiratory: clear  Abdomen: +BS, soft, NT  Musculoskeletal: moves all 4 ext   Data Reviewed: Basic Metabolic Panel:  Recent Labs Lab 05/02/12 1030 05/02/12 1617  NA 139  --   K 4.6  --   CL 101  --   CO2 31  --   GLUCOSE 82  --   BUN 25*  --   CREATININE 1.10 0.93  CALCIUM 10.3  --    Liver Function Tests: No results found for this basename: AST, ALT, ALKPHOS, BILITOT, PROT, ALBUMIN,  in the last 168 hours No results found for this basename: LIPASE, AMYLASE,  in the last 168 hours No results found for this basename: AMMONIA,  in the last 168 hours CBC:  Recent Labs Lab 05/02/12 1030 05/02/12 1617  WBC 5.2 5.5  NEUTROABS 3.5  --   HGB  14.6 15.0  HCT 43.2 42.9  MCV 87.4 85.5  PLT 140* 132*   Cardiac Enzymes:  Recent Labs Lab 05/02/12 1030 05/02/12 1617 05/02/12 2136 05/03/12 0530  TROPONINI <0.30 <0.30 <0.30 <0.30   BNP (last 3 results) No results found for this basename: PROBNP,  in the last 8760 hours CBG:  Recent Labs Lab 05/02/12 1024  GLUCAP 88    No results found for this or any previous visit (from the past 240 hour(s)).   Studies: Dg Chest 2 View  05/02/2012  *RADIOLOGY REPORT*  Clinical Data: Syncope  CHEST - 2 VIEW  Comparison: 06/28/2010  Findings: Lung volumes are at the lower limits of normal, with mild crowding of the bronchovascular markings at the lung bases.  Right costophrenic angle is omitted from the field of view.  No focal pulmonary opacity.  No pleural effusion.  No pneumothorax.  No acute osseous finding.  IMPRESSION: No acute cardiopulmonary process.   Original Report Authenticated By: Christiana Pellant, M.D.    Ct Head Wo Contrast  05/02/2012  *RADIOLOGY REPORT*  Clinical Data: 67 year old male with syncope, double vision, lethargy, incoherent.  History of hearing loss.  CT HEAD WITHOUT CONTRAST  Technique:  Contiguous axial images were obtained from the base of the skull through the vertex without contrast.  Comparison: Brain MRI 07/11/2010.  Findings: Visualized paranasal sinuses and mastoids are clear.  No acute osseous abnormality identified.  Visualized orbits and scalp soft tissues are within normal limits.  Cerebral volume is within normal limits for age.  Cerebral volume is within normal limits for age.  No midline shift, ventriculomegaly, mass effect, evidence of mass lesion, intracranial hemorrhage or evidence of cortically based acute infarction.  Gray-white matter differentiation is within normal limits throughout the brain.  No suspicious intracranial vascular hyperdensity.  IMPRESSION: Normal for age noncontrast CT appearance of the brain.   Original Report Authenticated By: Erskine Speed, M.D.     Scheduled Meds: . acetaminophen  650 mg Oral Q8H  . aspirin EC  81 mg Oral Daily  . atorvastatin  40 mg Oral q1800  . folic acid  1 mg Oral Daily  . heparin  5,000 Units Subcutaneous Q8H  . meloxicam  15 mg Oral Q breakfast  . multivitamin with minerals  1 tablet Oral Daily  . sodium chloride  3 mL Intravenous Q12H  . sodium chloride  3 mL Intravenous Q12H  . thiamine  100 mg Oral Daily   Or  . thiamine  100 mg Intravenous Daily   Continuous Infusions:   Active Problems:   HYPERLIPIDEMIA   Syncope    Time spent: 35    [facility], Alaijah Gibler  [facility] Pager [phone]. If 7PM-7AM, please contact night-coverage at [email or web address], password [facility] 05/03/2012, 9:16 AM  LOS: 1 day

## 2012-05-04 ENCOUNTER — Observation Stay (HOSPITAL_COMMUNITY): Payer: Medicare Other

## 2012-05-04 DIAGNOSIS — E785 Hyperlipidemia, unspecified: Secondary | ICD-10-CM

## 2012-05-04 MED ORDER — TECHNETIUM TO 99M ALBUMIN AGGREGATED
6.0000 | Freq: Once | INTRAVENOUS | Status: AC | PRN
  Administered 2012-05-04: 6 via INTRAVENOUS

## 2012-05-04 MED ORDER — TECHNETIUM TC 99M DIETHYLENETRIAME-PENTAACETIC ACID
40.0000 | Freq: Once | INTRAVENOUS | Status: AC | PRN
  Administered 2012-05-04: 40 via RESPIRATORY_TRACT

## 2012-05-04 NOTE — Discharge Summary (Signed)
Physician Discharge Summary  Jason Robles ZOX:[id number] DOB: 04-06-1945 DOA: 05/02/2012  PCP: Oliver Barre, MD  Admit date: 05/02/2012 Discharge date: 05/05/2012  Time spent: 35 minutes  Recommendations for Outpatient Follow-up:  1. No driving til seen by PCP  Discharge Diagnoses:  Active Problems:   HYPERLIPIDEMIA   Syncope   Discharge Condition: improved  Diet recommendation: cardiac  Filed Weights   05/02/12 1502 05/03/12 0525  Weight: 82.2 kg (181 lb 3.5 oz) 81.874 kg (180 lb 8 oz)    History of present illness:  Past medical history of hyperlipidemia, Raynaud's phenomena otherwise healthy and comes in for syncopal episode. He related this morning he was working out went home sat on the chair and passed out. [facility]. he did not have any prodromal symptoms, his wife found him pale and not sweating. He did not bite his tongue, did not lost control of the sphincters, he did not feel any palpitations, there were no seizure-like activities. When he woke up EMS decide him and he Was feeling back to baseline.  In ED:  Basic metabolic panel was within normal limits, his CBC showed no abnormality. UA did not show any signs of fixed flexion, chest x-ray showed no acute cardiopulmonary abnormality CT scan of the head showed no bleed, EKG shows normal sinus rhythm no T wave abnormalities.   Hospital Course:  Syncope:  - tele: r/o CVA, MRI ok, no sign of seizures. On physical exam he is neurological exam is intact, he relates no prodromal symptoms- does not sound vaso vagal. cardiac enzymes x3, 2-D echo okTSH ok and a B12 level ok    HYPERLIPIDEMIA:  - Continue statins   Procedures:  Echo: Study Conclusions  - Left ventricle: The cavity size was normal. Wall thickness was normal. Systolic function was normal. The estimated ejection fraction was in the range of 60% to 65%. Doppler parameters are consistent with abnormal left ventricular relaxation (grade  1 diastolic dysfunction). - Aortic valve: Mild regurgitation. - Left atrium: The atrium was mildly dilated.    Consultations:  none  Discharge Exam: Filed Vitals:   05/03/12 1224 05/03/12 2021 05/04/12 0410 05/04/12 1400  BP: 118/77 141/74 115/64 102/72  Pulse: 69 78 63 106  Temp: 97.9 F (36.6 C) 97.9 F (36.6 C) 97.7 F (36.5 C) 99 F (37.2 C)  TempSrc: Oral Oral Oral Oral  Resp: 16 18 18 18   Height:      Weight:      SpO2: 97% 97% 98% 97%    General: A+Ox3, NAD Cardiovascular: rrr Respiratory: clear anterior  Discharge Instructions      Discharge Orders   Future Appointments Provider Department Dept Phone   05/07/2012 2:15 PM Corwin Levins, MD [facility] [phone]   Future Orders Complete By Expires     Diet - low sodium heart healthy  As directed     Discharge instructions  As directed     Comments:      No driving until seen by PCP -event recorder    Increase activity slowly  As directed         Medication List    STOP taking these medications       zolpidem 10 MG tablet  Commonly known as:  AMBIEN      TAKE these medications       aspirin 81 MG tablet  Take 81 mg by mouth daily.     meloxicam 15 MG  tablet  Commonly known as:  MOBIC  Take 15 mg by mouth daily.     rosuvastatin 20 MG tablet  Commonly known as:  CRESTOR  Take 10 mg by mouth daily.     TYLENOL ARTHRITIS PAIN PO  Take 2 tablets by mouth daily as needed (body aches).       Allergies  Allergen Reactions  . Iohexol      Desc: PT developed itching post 125cc Omni injection. Treated w/ 50 mg PO benedryl and seen by Dr Tedra Senegal prior to release., Onset Date: [id number]       The results of significant diagnostics from this hospitalization (including imaging, microbiology, ancillary and laboratory) are listed below for reference.    Significant Diagnostic Studies: Dg Chest 2 View  05/04/2012  *RADIOLOGY REPORT*  Clinical Data: Syncope  CHEST - 2  VIEW  Comparison: 05/02/2012  Findings: Stable heart size and vascularity.  Low lung volumes persist.  No significant airspace disease, edema pattern, pneumonia, collapse, consolidation, effusion, or pneumothorax. Trachea is midline.  No significant interval change.  IMPRESSION: Stable exam.  No acute chest process   Original Report Authenticated By: Judie Petit. Miles Costain, M.D.    Dg Chest 2 View  05/02/2012  *RADIOLOGY REPORT*  Clinical Data: Syncope  CHEST - 2 VIEW  Comparison: 06/28/2010  Findings: Lung volumes are at the lower limits of normal, with mild crowding of the bronchovascular markings at the lung bases.  Right costophrenic angle is omitted from the field of view.  No focal pulmonary opacity.  No pleural effusion.  No pneumothorax.  No acute osseous finding.  IMPRESSION: No acute cardiopulmonary process.   Original Report Authenticated By: Christiana Pellant, M.D.    Ct Head Wo Contrast  05/02/2012  *RADIOLOGY REPORT*  Clinical Data: 67 year old male with syncope, double vision, lethargy, incoherent.  History of hearing loss.  CT HEAD WITHOUT CONTRAST  Technique:  Contiguous axial images were obtained from the base of the skull through the vertex without contrast.  Comparison: Brain MRI 07/11/2010.  Findings: Visualized paranasal sinuses and mastoids are clear.  No acute osseous abnormality identified.  Visualized orbits and scalp soft tissues are within normal limits.  Cerebral volume is within normal limits for age.  Cerebral volume is within normal limits for age.  No midline shift, ventriculomegaly, mass effect, evidence of mass lesion, intracranial hemorrhage or evidence of cortically based acute infarction.  Gray-white matter differentiation is within normal limits throughout the brain.  No suspicious intracranial vascular hyperdensity.  IMPRESSION: Normal for age noncontrast CT appearance of the brain.   Original Report Authenticated By: Erskine Speed, M.D.    Mr Brain Wo Contrast  05/03/2012  *RADIOLOGY  REPORT*  Clinical Data: Syncopal episode.  Hyperlipidemia.  Question infarct.  MRI HEAD WITHOUT CONTRAST  Technique:  Multiplanar, multiecho pulse sequences of the brain and surrounding structures were obtained according to standard protocol without intravenous contrast.  Comparison: 05/02/2012 CT.  The 07/11/2010 MR.  Findings: No acute infarct.  No intracranial hemorrhage.  No hydrocephalus.  Punctate nonspecific white matter hyperintensity left frontal lobe stable.  No intracranial mass lesion detected on this unenhanced exam.  Major intracranial vascular structures are patent.  No evidence of mesial temporal sclerosis.  Cervical medullary junction, pituitary region, pineal region and orbital structures unremarkable.  IMPRESSION: No acute abnormality.   Original Report Authenticated By: Lacy Duverney, M.D.     Microbiology: No results found for this or any previous visit (from the past 240 hour(s)).  Labs: Basic Metabolic Panel:  Recent Labs Lab 05/02/12 1030 05/02/12 1617  NA 139  --   K 4.6  --   CL 101  --   CO2 31  --   GLUCOSE 82  --   BUN 25*  --   CREATININE 1.10 0.93  CALCIUM 10.3  --    Liver Function Tests: No results found for this basename: AST, ALT, ALKPHOS, BILITOT, PROT, ALBUMIN,  in the last 168 hours No results found for this basename: LIPASE, AMYLASE,  in the last 168 hours No results found for this basename: AMMONIA,  in the last 168 hours CBC:  Recent Labs Lab 05/02/12 1030 05/02/12 1617  WBC 5.2 5.5  NEUTROABS 3.5  --   HGB 14.6 15.0  HCT 43.2 42.9  MCV 87.4 85.5  PLT 140* 132*   Cardiac Enzymes:  Recent Labs Lab 05/02/12 1030 05/02/12 1617 05/02/12 2136 05/03/12 0530  TROPONINI <0.30 <0.30 <0.30 <0.30   BNP: BNP (last 3 results) No results found for this basename: PROBNP,  in the last 8760 hours CBG:  Recent Labs Lab 05/02/12 1024  GLUCAP 88       Signed:  Cherell Colvin  [facility] 05/05/2012, 2:38 PM

## 2012-05-05 ENCOUNTER — Telehealth: Payer: Self-pay | Admitting: Internal Medicine

## 2012-05-05 ENCOUNTER — Other Ambulatory Visit: Payer: Self-pay | Admitting: Internal Medicine

## 2012-05-05 DIAGNOSIS — R55 Syncope and collapse: Secondary | ICD-10-CM

## 2012-05-05 NOTE — Telephone Encounter (Signed)
Pt was in the hospital over the weekend.  He was told to schedule a Holter monitor

## 2012-05-05 NOTE — Telephone Encounter (Signed)
D/c summary reviewed and this was not specifically menitoned  Perhaps it was a suggestion only?  The only recommendation was to not drive until f/u appt with PCP

## 2012-05-06 NOTE — Telephone Encounter (Signed)
Called left message to call back 

## 2012-05-06 NOTE — Telephone Encounter (Signed)
I dont see documentation, but pt is very sure of this  Ok for 2 wk holter , will order

## 2012-05-06 NOTE — Addendum Note (Signed)
Addended by: Corwin Levins on: 05/06/2012 12:32 PM   Modules accepted: Orders

## 2012-05-06 NOTE — Telephone Encounter (Signed)
The patient stated the MD at the hospital stated he would need a holter monitor for 2 weeks.  PCP was informed of what the patient was told in the hospital.  PCP stated he would then go ahead and order a holter monitor.

## 2012-05-07 ENCOUNTER — Encounter: Payer: Self-pay | Admitting: Internal Medicine

## 2012-05-07 ENCOUNTER — Encounter (INDEPENDENT_AMBULATORY_CARE_PROVIDER_SITE_OTHER): Payer: Medicare Other

## 2012-05-07 ENCOUNTER — Ambulatory Visit (INDEPENDENT_AMBULATORY_CARE_PROVIDER_SITE_OTHER): Payer: Medicare Other | Admitting: Internal Medicine

## 2012-05-07 VITALS — BP 120/70 | HR 69 | Temp 98.0°F | Ht 72.0 in | Wt 187.0 lb

## 2012-05-07 DIAGNOSIS — R55 Syncope and collapse: Secondary | ICD-10-CM

## 2012-05-07 NOTE — Progress Notes (Signed)
  Subjective:    Patient ID: Jason Robles, male    DOB: 29-Sep-1945, 67 y.o.   MRN: [id number]  HPI  Pt had to leave as he had card appt at 3 pm.  No PE done, but pt reports no further symptoms since hospn.  I stated he can likely drive, but also to f/u with cardiology as well.  No new complaints.  2 wk event monitor has been ordered    Review of Systems     Objective:   Physical Exam        Assessment & Plan:

## 2012-05-07 NOTE — Patient Instructions (Signed)
OK to drive, but also check with cardiology as well Please keep your appointments with your specialists as you have planned - cardiology today Your 2 wk Event monitor has been ordered

## 2012-05-07 NOTE — Assessment & Plan Note (Signed)
No exam today, pt had to leave, to f/u with cardiology

## 2012-05-13 DIAGNOSIS — R55 Syncope and collapse: Secondary | ICD-10-CM

## 2012-06-25 ENCOUNTER — Encounter: Payer: Self-pay | Admitting: Internal Medicine

## 2012-06-25 ENCOUNTER — Ambulatory Visit (INDEPENDENT_AMBULATORY_CARE_PROVIDER_SITE_OTHER): Payer: Medicare Other | Admitting: Internal Medicine

## 2012-06-25 VITALS — BP 112/70 | HR 69 | Temp 97.8°F | Ht 72.0 in | Wt 180.0 lb

## 2012-06-25 DIAGNOSIS — M25569 Pain in unspecified knee: Secondary | ICD-10-CM

## 2012-06-25 DIAGNOSIS — M25561 Pain in right knee: Secondary | ICD-10-CM | POA: Insufficient documentation

## 2012-06-25 DIAGNOSIS — M7700 Medial epicondylitis, unspecified elbow: Secondary | ICD-10-CM

## 2012-06-25 DIAGNOSIS — M7702 Medial epicondylitis, left elbow: Secondary | ICD-10-CM

## 2012-06-25 DIAGNOSIS — M79609 Pain in unspecified limb: Secondary | ICD-10-CM

## 2012-06-25 DIAGNOSIS — M79661 Pain in right lower leg: Secondary | ICD-10-CM

## 2012-06-25 HISTORY — DX: Medial epicondylitis, left elbow: M77.02

## 2012-06-25 HISTORY — DX: Pain in right knee: M25.561

## 2012-06-25 MED ORDER — NABUMETONE 500 MG PO TABS: 500.0000 mg | ORAL_TABLET | Freq: Two times a day (BID) | ORAL | Status: DC | PRN

## 2012-06-25 NOTE — Patient Instructions (Signed)
OK to stop the meloxicam Please take all new medication as prescribed - the generic relafen Please continue all other medications as before, and refills have been done if requested. You will be contacted regarding the referral for: knee ultrasound - rule out Baker's Cyst I think you should see your orthopedic if the pain near the knee continues or gets worse  Please remember to sign up for My Chart if you have not done so, as this will be important to you in the future with finding out test results, communicating by private email, and scheduling acute appointments online when needed.

## 2012-06-25 NOTE — Assessment & Plan Note (Signed)
Mild, for change mobic to relafen, for arm band, avoid work that would aggrevate if possible

## 2012-06-25 NOTE — Progress Notes (Signed)
Subjective:    Patient ID: Jason Robles, male    DOB: [birth year], 67 y.o.   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 Swelling to the area, no hx of bakers cyst, noticed worse with driving long distance with right knee in about 90 degree flexion, recurs with that position since then but better after stands up to walk.  Has end stage right knee DJD, mobic now not working as well, has intermittent effusion, consider making f/u with orthopedic but is trying to hold off on surgury.Also has left medial elbow tender/swelling worse later in the day after working on the framiing as well. No distal arm symtpoms or pain or swelling or numbness, No neck pain Pt denies chest pain, increased sob or doe, wheezing, orthopnea, PND, increased LE swelling, palpitations, dizziness or syncope. Past Medical History  Diagnosis Date  . ABDOMINAL PAIN OTHER SPECIFIED SITE 04/30/2008  . ALLERGIC RHINITIS 08/25/2007  . BENIGN PROSTATIC HYPERTROPHY 08/21/2006  . COLONIC POLYPS, HX OF 04/18/2007  . 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 Diverticulosis of colon (without mention of hemorrhage) 04/18/2007  . EXTERNAL HEMORRHOIDS 04/18/2007  . Headache(784.0) 09/20/2009  . HEARING LOSS, 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  . HYPERLIPIDEMIA 08/21/2006  . INSOMNIA-SLEEP DISORDER-UNSPEC 09/20/2009  . NONSPECIFIC ABN FINDING RAD & OTH EXAM GU ORGAN 05/03/2008  . PERIPHERAL VASCULAR DISEASE 08/25/2007  . Raynaud's syndrome 08/21/2006  . Anxiety 06/28/2010  . Chest pain 06/28/2010   Past Surgical History  Procedure Laterality Date  . Appendectomy    . S/p knee surgery      x's 4 since 1980  . S/p left shoulder  2002    rotator cuff  . S/p lumbar disc surgery  2007    reports that he has quit smoking. His smoking use included Cigarettes. He has a 15 pack-year smoking  history. He has never used smokeless tobacco. He reports that he drinks about 8.4 ounces of alcohol per week. He reports that he does not use illicit drugs. family history includes Alzheimer's disease in his mother; Diabetes in his maternal uncle and mother; and Kidney cancer in his brother. Allergies  Allergen Reactions  . Iohexol      Desc: PT developed itching post 125cc Omni injection. Treated w/ 50 mg PO benedryl and seen by Dr Tedra Senegal prior to release., Onset Date: [id number]    Current Outpatient Prescriptions on File Prior to Visit  Medication Sig Dispense Refill  . Acetaminophen (TYLENOL ARTHRITIS PAIN PO) Take 2 tablets by mouth daily as needed (body aches).      [name] aspirin 81 MG tablet Take 81 mg by mouth daily.        . rosuvastatin (CRESTOR) 20 MG tablet Take 10 mg by mouth daily.       No current facility-administered medications on file prior to visit.   Review of Systems  Constitutional: Negative for unexpected weight change, or unusual diaphoresis  HENT: Negative for tinnitus.   Eyes: Negative for photophobia and visual disturbance.  Respiratory: Negative for choking and stridor.   Gastrointestinal: Negative for vomiting and blood in stool.  Genitourinary: Negative for hematuria and decreased urine volume.  Musculoskeletal: Negative for acute joint swelling Skin: Negative for color change and wound.  Neurological: Negative for tremors and numbness other than noted  Psychiatric/Behavioral:  Negative for decreased concentration or  hyperactivity.  '     Objective:   Physical Exam BP 112/70  Pulse 69  Temp(Src) 97.8 F (36.6 C) (Oral)  Ht 6' (1.829 m)  Wt 180 lb (81.647 kg)  BMI 24.41 kg/m2  SpO2 96% VS noted,  Constitutional: Pt appears well-developed and well-nourished.  HENT: Head: NCAT.  Right Ear: External ear normal.  Left Ear: External ear normal.  Eyes: Conjunctivae and EOM are normal. Pupils are equal, round, and reactive to light.  Neck: Normal range of  motion. Neck supple.  Cardiovascular: Normal rate and regular rhythm.   Pulmonary/Chest: Effort normal and breath sounds normal.  Abd:  Soft, NT, non-distended, + BS Neurological: Pt is alert. Not confused  Skin: Skin is warm. No erythema.  Mild tender left medial epicondylar area - LUE o/w neurovasc intact RLE with large bony deg changes, small effusion, decreased ROM, crepitus, no joint line tender, and no overt post knee cystic mass or ligament tender though this is where he points Right calf proximal tender as well, but no cords, swelling Psychiatric: Pt behavior is normal. Thought content normal.     Assessment & Plan:

## 2012-06-25 NOTE — Assessment & Plan Note (Signed)
I suspect primarily ligamentous strain due to standing at work, but exam does not prove this, LE venous doppler also to r/o bakers cyst, consider f/u with ortho

## 2012-06-25 NOTE — Assessment & Plan Note (Signed)
Suspect c/w msk strain with standing at work, but with tender and other hx above will neck LE venous doppler r/o DVT

## 2012-07-03 ENCOUNTER — Encounter (INDEPENDENT_AMBULATORY_CARE_PROVIDER_SITE_OTHER): Payer: Medicare Other

## 2012-07-03 DIAGNOSIS — M7989 Other specified soft tissue disorders: Secondary | ICD-10-CM

## 2012-07-03 DIAGNOSIS — M79609 Pain in unspecified limb: Secondary | ICD-10-CM

## 2012-07-03 DIAGNOSIS — M25561 Pain in right knee: Secondary | ICD-10-CM

## 2012-07-03 DIAGNOSIS — M79661 Pain in right lower leg: Secondary | ICD-10-CM

## 2012-07-22 ENCOUNTER — Ambulatory Visit (INDEPENDENT_AMBULATORY_CARE_PROVIDER_SITE_OTHER): Payer: Medicare Other | Admitting: Internal Medicine

## 2012-07-22 ENCOUNTER — Encounter: Payer: Self-pay | Admitting: Internal Medicine

## 2012-07-22 VITALS — BP 104/70 | HR 76 | Temp 97.2°F | Ht 72.0 in | Wt 179.5 lb

## 2012-07-22 DIAGNOSIS — F411 Generalized anxiety disorder: Secondary | ICD-10-CM

## 2012-07-22 DIAGNOSIS — F419 Anxiety disorder, unspecified: Secondary | ICD-10-CM

## 2012-07-22 DIAGNOSIS — M7702 Medial epicondylitis, left elbow: Secondary | ICD-10-CM

## 2012-07-22 DIAGNOSIS — M25569 Pain in unspecified knee: Secondary | ICD-10-CM

## 2012-07-22 DIAGNOSIS — M7700 Medial epicondylitis, unspecified elbow: Secondary | ICD-10-CM

## 2012-07-22 DIAGNOSIS — M25561 Pain in right knee: Secondary | ICD-10-CM

## 2012-07-22 MED ORDER — TRAMADOL HCL 50 MG PO TABS: 50.0000 mg | ORAL_TABLET | Freq: Three times a day (TID) | ORAL | Status: DC | PRN

## 2012-07-22 NOTE — Progress Notes (Signed)
Subjective:    Patient ID: Jason Robles, male    DOB: [birth year], 67 y.o.   MRN: [id number]  HPI  Here to f/u, unfortunately right knee pain has increased overall to 7-8/10 since last visit now with mild swelling, especially bad by end of work day after standing, still has feeling of localized swelling mid post knee/mild tender, no click or catch, giveaways, fever, trauma or recent falls.  Current meds just not helping.  Has not seen ortho.   Pt denies chest pain, increased sob or doe, wheezing, orthopnea, PND, increased LE swelling, palpitations, dizziness or syncope.   Pt denies polydipsia, polyuria.  Pt denies new neurological symptoms such as new headache, or facial or extremity weakness or numbness Recent right LE venous doppler neg. Past Medical History  Diagnosis Date  . ABDOMINAL PAIN OTHER SPECIFIED SITE 04/30/2008  . ALLERGIC RHINITIS 08/25/2007  . BENIGN PROSTATIC HYPERTROPHY 08/21/2006  . COLONIC POLYPS, HX OF 04/18/2007  . 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 Diverticulosis of colon (without mention of hemorrhage) 04/18/2007  . EXTERNAL HEMORRHOIDS 04/18/2007  . Headache(784.0) 09/20/2009  . HEARING LOSS, 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  . HYPERLIPIDEMIA 08/21/2006  . INSOMNIA-SLEEP DISORDER-UNSPEC 09/20/2009  . NONSPECIFIC ABN FINDING RAD & OTH EXAM GU ORGAN 05/03/2008  . PERIPHERAL VASCULAR DISEASE 08/25/2007  . Raynaud's syndrome 08/21/2006  . Anxiety 06/28/2010  . Chest pain 06/28/2010   Past Surgical History  Procedure Laterality Date  . Appendectomy    . S/p knee surgery      x's 4 since 1980  . S/p left shoulder  2002    rotator cuff  . S/p lumbar disc surgery  2007    reports that he has quit smoking. His smoking use included Cigarettes. He has a 15 pack-year smoking history. He has never used smokeless tobacco. He reports that he drinks about 8.4 ounces of alcohol per week. He reports that he does not use illicit drugs. family history includes Alzheimer's  disease in his mother; Diabetes in his maternal uncle and mother; and Kidney cancer in his brother. Allergies  Allergen Reactions  . Iohexol      Desc: PT developed itching post 125cc Omni injection. Treated w/ 50 mg PO benedryl and seen by Dr Tedra Senegal prior to release., Onset Date: [id number]    Current Outpatient Prescriptions on File Prior to Visit  Medication Sig Dispense Refill  . Acetaminophen (TYLENOL ARTHRITIS PAIN PO) Take 2 tablets by mouth daily as needed (body aches).      [name] aspirin 81 MG tablet Take 81 mg by mouth daily.        . nabumetone (RELAFEN) 500 MG tablet Take 1 tablet (500 mg total) by mouth 2 (two) times daily as needed for pain.  180 tablet  3  . rosuvastatin (CRESTOR) 20 MG tablet Take 10 mg by mouth daily.       No current facility-administered medications on file prior to visit.   Review of Systems  Constitutional: Negative for unexpected weight change, or unusual diaphoresis  HENT: Negative for tinnitus.   Eyes: Negative for photophobia and visual disturbance.  Respiratory: Negative for choking and stridor.   Gastrointestinal: Negative for vomiting and blood in stool.  Genitourinary: Negative for hematuria and decreased urine volume.  Musculoskeletal: Negative for acute joint swelling Skin: Negative for color change and wound.  Neurological: Negative for tremors and numbness other than noted  Psychiatric/Behavioral: Negative for decreased concentration or  hyperactivity.       Objective:  Physical Exam BP 104/70  Pulse 76  Temp(Src) 97.2 F (36.2 C) (Oral)  Ht 6' (1.829 m)  Wt 179 lb 8 oz (81.421 kg)  BMI 24.34 kg/m2  SpO2 95% VS noted,  Constitutional: Pt appears well-developed and well-nourished.  HENT: Head: NCAT.  Right Ear: External ear normal.  Left Ear: External ear normal.  Eyes: Conjunctivae and EOM are normal. Pupils are equal, round, and reactive to light.  Neck: Normal range of motion. Neck supple.  Cardiovascular: Normal rate and  regular rhythm.   Pulmonary/Chest: Effort normal and breath sounds normal.  Right knee with trace to 1+ effusion, crepitus, mild decreased ROM, tender post midline with ? Bakers cyst vs fatty tissue, gait antalgic, calf nontender, left elbow NT - no bursa swelling Neurological: Pt is alert. Not confused , motor 5/5 Skin: Skin is warm. No erythema.  Psychiatric: Pt behavior is normal. Thought content normal. not depressed affect, minimal nervous today     Assessment & Plan:

## 2012-07-22 NOTE — Patient Instructions (Addendum)
Please take all new medication as prescribed - the tramadol Please continue all other medications as before, and refills have been done if requested. Please have the pharmacy call with any other refills you may need. You will be contacted regarding the referral for: orthopedic for the knee  Please remember to sign up for My Chart if you have not done so, as this will be important to you in the future with finding out test results, communicating by private email, and scheduling acute appointments online when needed.

## 2012-07-23 NOTE — Assessment & Plan Note (Signed)
Resolved, reassured,  to f/u any worsening symptoms or concerns 

## 2012-07-23 NOTE — Assessment & Plan Note (Signed)
stable overall by history and exam, recent data reviewed with pt, and pt to continue medical treatment as before,  to f/u any worsening symptoms or concerns Lab Results  Component Value Date   WBC 5.5 05/02/2012   HGB 15.0 05/02/2012   HCT 42.9 05/02/2012   PLT 132* 05/02/2012   GLUCOSE 82 05/02/2012   CHOL 177 03/26/2012   TRIG 239.0* 03/26/2012   HDL 45.00 03/26/2012   LDLDIRECT 94.2 03/26/2012   ALT 28 03/26/2012   AST 23 03/26/2012   NA 139 05/02/2012   K 4.6 05/02/2012   CL 101 05/02/2012   CREATININE 0.93 05/02/2012   BUN 25* 05/02/2012   CO2 31 05/02/2012   TSH 1.240 05/02/2012   PSA 4.06* 03/26/2012   INR 0.89 05/02/2012   HGBA1C 5.6 05/02/2012

## 2012-07-23 NOTE — Assessment & Plan Note (Signed)
With effusion and worsening pain, for tramadol prn, and refer ortho - ? Need cortisone, pt very reluctant for any type of surgury, but doubt cartilage tear though cannot be completely ruled out

## 2012-08-06 ENCOUNTER — Other Ambulatory Visit: Payer: Self-pay

## 2012-08-18 ENCOUNTER — Encounter: Payer: Self-pay | Admitting: Internal Medicine

## 2012-08-18 ENCOUNTER — Telehealth: Payer: Self-pay

## 2012-08-18 ENCOUNTER — Ambulatory Visit (INDEPENDENT_AMBULATORY_CARE_PROVIDER_SITE_OTHER): Payer: Medicare Other | Admitting: Internal Medicine

## 2012-08-18 VITALS — BP 112/70 | HR 72 | Temp 99.8°F | Ht 72.0 in | Wt 180.0 lb

## 2012-08-18 DIAGNOSIS — L02512 Cutaneous abscess of left hand: Secondary | ICD-10-CM | POA: Insufficient documentation

## 2012-08-18 MED ORDER — ZOLPIDEM TARTRATE 10 MG PO TABS: 10.0000 mg | ORAL_TABLET | Freq: Every evening | ORAL | Status: DC | PRN

## 2012-08-18 MED ORDER — DOXYCYCLINE HYCLATE 100 MG PO TABS: 100.0000 mg | ORAL_TABLET | Freq: Two times a day (BID) | ORAL | Status: DC

## 2012-08-18 NOTE — Telephone Encounter (Signed)
Done hardcopy to robin  

## 2012-08-18 NOTE — Patient Instructions (Signed)
Please take all new medication as prescribed - the antibiotic Please continue all other medications as before, including the pain medication that you already have  You will be contacted regarding the referral for: Hand Surgury (Dr Mina Marble) for tomorrow - to see [facility] now

## 2012-08-18 NOTE — Assessment & Plan Note (Signed)
Mild to mod, for doxy course, cont tramadol prn, d/w hand surgeon Dr Mina Marble who agreed to see pt in office aug 19 - will refer

## 2012-08-18 NOTE — Telephone Encounter (Signed)
Patient request refill of Ambien to [facility]

## 2012-08-18 NOTE — Telephone Encounter (Signed)
Faxed hardcopy to [facility] [place] 

## 2012-08-18 NOTE — Progress Notes (Signed)
Subjective:    Patient ID: Jason Robles, male    DOB: January 22, 1945, 67 y.o.   MRN: [id number]  HPI  Here with 2 days onset rapidly worsening moderate left primarily post hand red/swelling/tender with fever today, no red streaks, chills.  No drainage, started after a small cut to the post hand at the [employer] while working.  No prior hx of MRSA  Past Medical History  Diagnosis Date  . ABDOMINAL PAIN OTHER SPECIFIED SITE 04/30/2008  . ALLERGIC RHINITIS 08/25/2007  . BENIGN PROSTATIC HYPERTROPHY 08/21/2006  . COLONIC POLYPS, HX OF 04/18/2007  . 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 Diverticulosis of colon (without mention of hemorrhage) 04/18/2007  . EXTERNAL HEMORRHOIDS 04/18/2007  . Headache(784.0) 09/20/2009  . HEARING LOSS, 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  . HYPERLIPIDEMIA 08/21/2006  . INSOMNIA-SLEEP DISORDER-UNSPEC 09/20/2009  . NONSPECIFIC ABN FINDING RAD & OTH EXAM GU ORGAN 05/03/2008  . PERIPHERAL VASCULAR DISEASE 08/25/2007  . Raynaud's syndrome 08/21/2006  . Anxiety 06/28/2010  . Chest pain 06/28/2010   Past Surgical History  Procedure Laterality Date  . Appendectomy    . S/p knee surgery      x's 4 since 1980  . S/p left shoulder  2002    rotator cuff  . S/p lumbar disc surgery  2007    reports that he has quit smoking. His smoking use included Cigarettes. He has a 15 pack-year smoking history. He has never used smokeless tobacco. He reports that he drinks about 8.4 ounces of alcohol per week. He reports that he does not use illicit drugs. family history includes Alzheimer's disease in his mother; Diabetes in his maternal uncle and mother; Kidney cancer in his brother. Allergies  Allergen Reactions  . Iohexol      Desc: PT developed itching post 125cc Omni injection. Treated w/ 50 mg PO benedryl and seen by Dr Tedra Senegal prior to release., Onset Date: [id number]    Current Outpatient Prescriptions on File Prior to Visit  Medication Sig Dispense Refill  . Acetaminophen (TYLENOL  ARTHRITIS PAIN PO) Take 2 tablets by mouth daily as needed (body aches).      [name] aspirin 81 MG tablet Take 81 mg by mouth daily.        . nabumetone (RELAFEN) 500 MG tablet Take 1 tablet (500 mg total) by mouth 2 (two) times daily as needed for pain.  180 tablet  3  . rosuvastatin (CRESTOR) 20 MG tablet Take 10 mg by mouth daily.      . traMADol (ULTRAM) 50 MG tablet Take 1 tablet (50 mg total) by mouth every 8 (eight) hours as needed for pain.  120 tablet  5  . zolpidem (AMBIEN) 10 MG tablet Take 1 tablet (10 mg total) by mouth at bedtime as needed for sleep.  30 tablet  5   No current facility-administered medications on file prior to visit.   Review of Systems All otherwise neg per pt     Objective:   Physical Exam BP 112/70  Pulse 72  Temp(Src) 99.8 F (37.7 C) (Oral)  Ht 6' (1.829 m)  Wt 180 lb (81.647 kg)  BMI 24.41 kg/m2  SpO2 97% VS noted, not ill appearing Constitutional: Pt appears well-developed and well-nourished.  HENT: Head: NCAT.  Right Ear: External ear normal.  Left Ear: External ear normal.  Eyes: Conjunctivae and EOM are normal. Pupils are equal, round, and reactive to light.  Neck: Normal range of motion. Neck supple.  Cardiovascular: Normal rate and regular rhythm.  Pulmonary/Chest: Effort normal and breath sounds normal.  Neurological: Pt is alert. Not confused  Skin:  Left post hand with 4x4 cm area red/tender/swelling diffuse hand with some tender to the palm, no drainage, small shallow .5 cm laceration Psychiatric: Pt behavior is normal. Thought content normal.     Assessment & Plan:

## 2012-09-30 ENCOUNTER — Telehealth: Payer: Self-pay

## 2012-09-30 NOTE — Telephone Encounter (Signed)
The patient was previously on slow release ambien, but due to insurance change had to change to ambien 10 mg.  He stated it only last 2 to 3 hours and then he is awake. He would like to know if any alternative that would be close to the slow release he use to take???

## 2012-09-30 NOTE — Telephone Encounter (Signed)
Unfortunately there is no easy answer, the only thing is to consider change to another med such as Lunesta 3 mg, trazodone 50 mg or temazepam 30 mg (the last usually is a last alternative as it can be habit forming)

## 2012-10-01 NOTE — Telephone Encounter (Signed)
Called informed the patient of alternatives.  He stated he would research these 3 medications as well as call his insurance company to see what they would cover.  Stated he would call back.

## 2012-11-06 ENCOUNTER — Other Ambulatory Visit: Payer: Self-pay

## 2012-11-06 ENCOUNTER — Telehealth: Payer: Self-pay

## 2012-11-06 MED ORDER — TRAZODONE HCL 50 MG PO TABS: 25.0000 mg | ORAL_TABLET | Freq: Every evening | ORAL | Status: DC | PRN

## 2012-11-06 NOTE — Telephone Encounter (Signed)
Sleep medication is no longer working, waking 2 to 3 times at night, once awake cannot go back to sleep.  Please advise on alternative.  [facility]

## 2012-11-06 NOTE — Telephone Encounter (Signed)
Ok for trazodone 50 qhs prn - done erx 

## 2012-11-06 NOTE — Telephone Encounter (Signed)
Patient informed. 

## 2012-11-12 ENCOUNTER — Other Ambulatory Visit: Payer: Self-pay | Admitting: *Deleted

## 2012-11-12 MED ORDER — ROSUVASTATIN CALCIUM 20 MG PO TABS: 10.0000 mg | ORAL_TABLET | Freq: Every day | ORAL | Status: DC

## 2012-11-21 ENCOUNTER — Telehealth: Payer: Self-pay

## 2012-11-21 MED ORDER — ATORVASTATIN CALCIUM 20 MG PO TABS: 20.0000 mg | ORAL_TABLET | Freq: Every day | ORAL | Status: DC

## 2012-11-21 NOTE — Telephone Encounter (Signed)
Ok for change to lipitor 20, which should end being about the same effect, once per day, and generic

## 2012-11-21 NOTE — Telephone Encounter (Signed)
The patient called to inform his Crestor cost for 30 days $80,  Please offer more affordable alternative.

## 2012-11-21 NOTE — Telephone Encounter (Signed)
Patient informed of change. 

## 2013-01-20 ENCOUNTER — Telehealth: Payer: Self-pay | Admitting: Internal Medicine

## 2013-01-20 NOTE — Telephone Encounter (Signed)
Called and the line was busy

## 2013-01-20 NOTE — Telephone Encounter (Signed)
Pt request phone call from Dr. Jenny Reichmann assistant. Please give pt a call

## 2013-01-22 NOTE — Telephone Encounter (Signed)
Called the patient and he is faxing to our office info. From his insurance company regarding a PA for his sleep medication.

## 2013-02-05 ENCOUNTER — Telehealth: Payer: Self-pay

## 2013-02-05 NOTE — Telephone Encounter (Signed)
Called for PA approval for Zolpidem 10 mg.  PA has been approved, [id number] through todays date 02/05/13 through the end of they year December 2015.  Patient and pharmacy will be informed .

## 2013-02-10 ENCOUNTER — Other Ambulatory Visit: Payer: Self-pay | Admitting: Internal Medicine

## 2013-02-10 NOTE — Telephone Encounter (Signed)
Faxed hardcopy to [place]

## 2013-02-10 NOTE — Telephone Encounter (Signed)
Done hardcopy to robin  

## 2013-03-11 ENCOUNTER — Emergency Department (HOSPITAL_BASED_OUTPATIENT_CLINIC_OR_DEPARTMENT_OTHER): Payer: No Typology Code available for payment source

## 2013-03-11 ENCOUNTER — Emergency Department (HOSPITAL_BASED_OUTPATIENT_CLINIC_OR_DEPARTMENT_OTHER)
Admission: EM | Admit: 2013-03-11 | Discharge: 2013-03-11 | Disposition: A | Discharge status: Home / Self Care | Payer: No Typology Code available for payment source | Attending: Emergency Medicine | Admitting: Emergency Medicine

## 2013-03-11 ENCOUNTER — Encounter (HOSPITAL_BASED_OUTPATIENT_CLINIC_OR_DEPARTMENT_OTHER): Payer: Self-pay | Admitting: Emergency Medicine

## 2013-03-11 DIAGNOSIS — Z860100 Personal history of colon polyps, unspecified: Secondary | ICD-10-CM | POA: Insufficient documentation

## 2013-03-11 DIAGNOSIS — M549 Dorsalgia, unspecified: Secondary | ICD-10-CM

## 2013-03-11 DIAGNOSIS — S6980XA Other specified injuries of unspecified wrist, hand and finger(s), initial encounter: Secondary | ICD-10-CM | POA: Insufficient documentation

## 2013-03-11 DIAGNOSIS — Z8679 Personal history of other diseases of the circulatory system: Secondary | ICD-10-CM | POA: Insufficient documentation

## 2013-03-11 DIAGNOSIS — M79646 Pain in unspecified finger(s): Secondary | ICD-10-CM

## 2013-03-11 DIAGNOSIS — Z87448 Personal history of other diseases of urinary system: Secondary | ICD-10-CM | POA: Insufficient documentation

## 2013-03-11 DIAGNOSIS — M1710 Unilateral primary osteoarthritis, unspecified knee: Secondary | ICD-10-CM | POA: Insufficient documentation

## 2013-03-11 DIAGNOSIS — Y92410 Unspecified street and highway as the place of occurrence of the external cause: Secondary | ICD-10-CM | POA: Insufficient documentation

## 2013-03-11 DIAGNOSIS — Z7982 Long term (current) use of aspirin: Secondary | ICD-10-CM | POA: Insufficient documentation

## 2013-03-11 DIAGNOSIS — IMO0002 Reserved for concepts with insufficient information to code with codable children: Secondary | ICD-10-CM | POA: Insufficient documentation

## 2013-03-11 DIAGNOSIS — Z8669 Personal history of other diseases of the nervous system and sense organs: Secondary | ICD-10-CM | POA: Insufficient documentation

## 2013-03-11 DIAGNOSIS — F411 Generalized anxiety disorder: Secondary | ICD-10-CM | POA: Insufficient documentation

## 2013-03-11 DIAGNOSIS — Z792 Long term (current) use of antibiotics: Secondary | ICD-10-CM | POA: Insufficient documentation

## 2013-03-11 DIAGNOSIS — H9190 Unspecified hearing loss, unspecified ear: Secondary | ICD-10-CM | POA: Insufficient documentation

## 2013-03-11 DIAGNOSIS — Y9389 Activity, other specified: Secondary | ICD-10-CM | POA: Insufficient documentation

## 2013-03-11 DIAGNOSIS — S6990XA Unspecified injury of unspecified wrist, hand and finger(s), initial encounter: Principal | ICD-10-CM | POA: Insufficient documentation

## 2013-03-11 DIAGNOSIS — E785 Hyperlipidemia, unspecified: Secondary | ICD-10-CM | POA: Insufficient documentation

## 2013-03-11 DIAGNOSIS — Z79899 Other long term (current) drug therapy: Secondary | ICD-10-CM | POA: Insufficient documentation

## 2013-03-11 DIAGNOSIS — Z8719 Personal history of other diseases of the digestive system: Secondary | ICD-10-CM | POA: Insufficient documentation

## 2013-03-11 DIAGNOSIS — Z8709 Personal history of other diseases of the respiratory system: Secondary | ICD-10-CM | POA: Insufficient documentation

## 2013-03-11 DIAGNOSIS — Z87891 Personal history of nicotine dependence: Secondary | ICD-10-CM | POA: Insufficient documentation

## 2013-03-11 MED ORDER — HYDROCODONE-ACETAMINOPHEN 5-325 MG PO TABS: 1.0000 | ORAL_TABLET | ORAL | Status: DC | PRN
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
## 2013-03-11 NOTE — ED Provider Notes (Signed)
Medical screening examination/treatment/procedure(s) were performed by non-physician practitioner and as supervising physician I was immediately available for consultation/collaboration.   EKG Interpretation None        Malvin Johns, MD 03/11/13 1847
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
## 2013-03-26 ENCOUNTER — Ambulatory Visit (INDEPENDENT_AMBULATORY_CARE_PROVIDER_SITE_OTHER): Payer: Medicare Other | Admitting: Internal Medicine

## 2013-03-26 ENCOUNTER — Encounter: Payer: Self-pay | Admitting: Internal Medicine

## 2013-03-26 ENCOUNTER — Other Ambulatory Visit (INDEPENDENT_AMBULATORY_CARE_PROVIDER_SITE_OTHER): Payer: Medicare Other

## 2013-03-26 VITALS — BP 124/60 | HR 72 | Temp 98.2°F | Resp 14 | Wt 179.6 lb

## 2013-03-26 DIAGNOSIS — R82998 Other abnormal findings in urine: Secondary | ICD-10-CM

## 2013-03-26 DIAGNOSIS — Z0000 Encounter for general adult medical examination without abnormal findings: Secondary | ICD-10-CM

## 2013-03-26 DIAGNOSIS — R8290 Unspecified abnormal findings in urine: Secondary | ICD-10-CM

## 2013-03-26 LAB — URINALYSIS, ROUTINE W REFLEX MICROSCOPIC
BILIRUBIN URINE: NEGATIVE
HGB URINE DIPSTICK: NEGATIVE
KETONES UR: NEGATIVE
Leukocytes, UA: NEGATIVE
NITRITE: NEGATIVE
Specific Gravity, Urine: >=1.03 — AB (ref 1.000–1.030)
TOTAL PROTEIN, URINE-UPE24: NEGATIVE
URINE GLUCOSE: NEGATIVE
Urobilinogen, UA: 0.2 (ref 0.0–1.0)
pH: 5.5 (ref 5.0–8.0)

## 2013-03-26 LAB — POCT URINALYSIS DIPSTICK
Bilirubin, UA: NEGATIVE
Blood, UA: NEGATIVE
Glucose, UA: NEGATIVE
Ketones, UA: NEGATIVE
Leukocytes, UA: NEGATIVE
Nitrite, UA: NEGATIVE
PH UA: 6
PROTEIN UA: NEGATIVE
UROBILINOGEN UA: 0.2

## 2013-03-26 NOTE — Progress Notes (Signed)
Pre visit review using our clinic review tool, if applicable. No additional management support is needed unless otherwise documented below in the visit note. 

## 2013-03-26 NOTE — Addendum Note (Signed)
Addended by: Harl Bowie on: 03/26/2013 04:39 PM   Modules accepted: Orders

## 2013-03-26 NOTE — Patient Instructions (Signed)
Stay well hydrated. Drink to thirst up to 40 ounces of fluids daily.
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
## 2013-09-05 ENCOUNTER — Other Ambulatory Visit: Payer: Self-pay | Admitting: Internal Medicine

## 2013-09-08 ENCOUNTER — Other Ambulatory Visit: Payer: Self-pay | Admitting: Dermatology

## 2013-09-08 NOTE — Telephone Encounter (Signed)
Done hardcopy to robin  Due ROV

## 2013-09-08 NOTE — Telephone Encounter (Signed)
Faxed hardcopy to [facility] for Zolpidem

## 2013-10-05 ENCOUNTER — Telehealth: Payer: Self-pay

## 2013-10-06 ENCOUNTER — Other Ambulatory Visit: Payer: Self-pay

## 2013-10-06 MED ORDER — ZOLPIDEM TARTRATE 10 MG PO TABS: ORAL_TABLET | ORAL | Status: DC

## 2013-10-06 NOTE — Telephone Encounter (Signed)
Done hardcopy to robin  

## 2013-10-07 NOTE — Telephone Encounter (Signed)
Faxed hardcopy for Zolpidem [facility]

## 2013-10-12 NOTE — Telephone Encounter (Signed)
Closing encounter jasmine [facility]

## 2014-01-07 ENCOUNTER — Telehealth: Payer: Self-pay | Admitting: Internal Medicine

## 2014-01-07 NOTE — Telephone Encounter (Signed)
Called 800 number for PA on Ambien.. Medication has been approved from 01/01/14 through 01/01/2015.  Will received a approval letter, will copy to scan, fax to pharmacy and mail a copy to the patient as well... Did call the patient informed of approval as well.

## 2014-01-07 NOTE — Telephone Encounter (Signed)
Pt's medication, Ambien needs a prior authorization [id number]. [facility] is pharmacy of choice.

## 2014-01-19 ENCOUNTER — Telehealth: Payer: Self-pay

## 2014-01-19 MED ORDER — ZOLPIDEM TARTRATE 10 MG PO TABS: ORAL_TABLET | ORAL | Status: DC

## 2014-01-19 NOTE — Telephone Encounter (Signed)
Pt was able to get preapproval of zolpidem. He would like a refill. Is this okay.   Mobile to contact. [facility] [address] for refill.

## 2014-01-19 NOTE — Telephone Encounter (Signed)
Done hardcopy to steph 

## 2014-01-19 NOTE — Telephone Encounter (Signed)
Pt informed rx has been faxed to pharmacy.

## 2014-06-28 ENCOUNTER — Other Ambulatory Visit: Payer: Self-pay

## 2014-07-05 IMAGING — CT CT HEAD W/O CM
1 series · 16 of 30 positions shown, 20 images · non-contrast
Comparison: Brain MRI 07/11/2010.

CLINICAL DATA: 66-year-old male with syncope, double vision,
lethargy, incoherent.  History of hearing loss.

CT HEAD WITHOUT CONTRAST
TECHNIQUE: Contiguous axial images were obtained from the base of
the skull through the vertex without contrast.

[Series 2: head 4.8 h37s · axial · 0.47mm/px · z∈[+1156,+1293]mm · 16 of 32 slices shown, 20 images]
[im 2/32  brain]
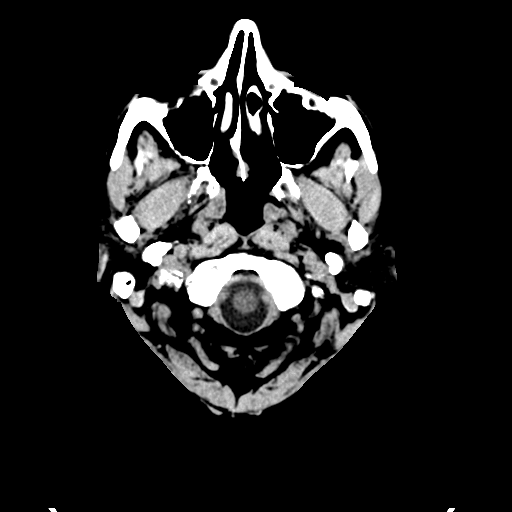
[im 2/32  bone]
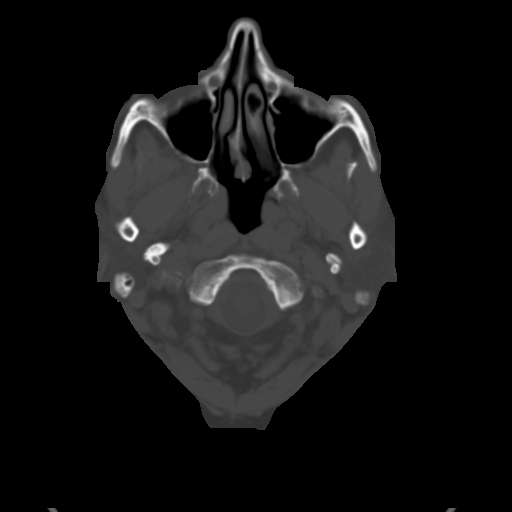
[im 4/32  brain]
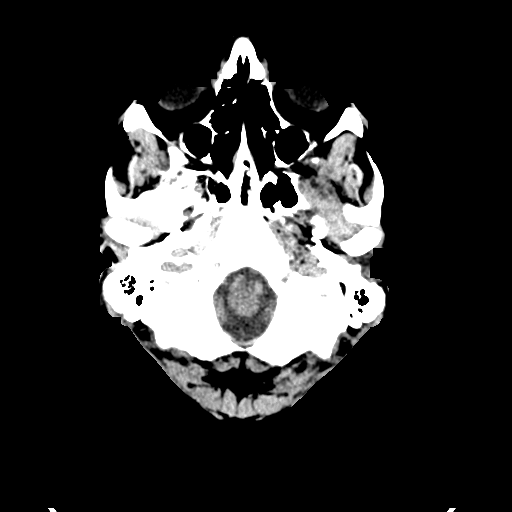
[im 6/32  brain]
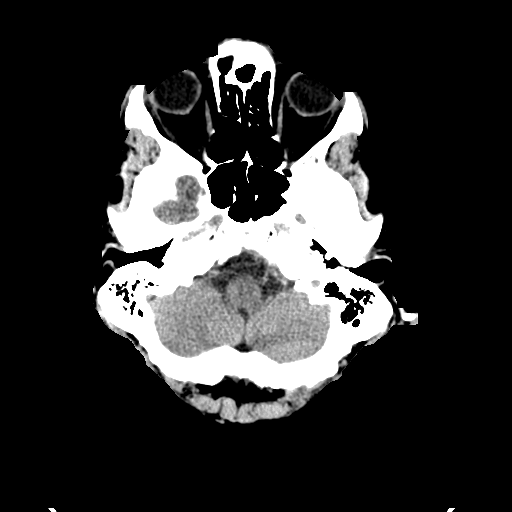
[im 8/32  brain]
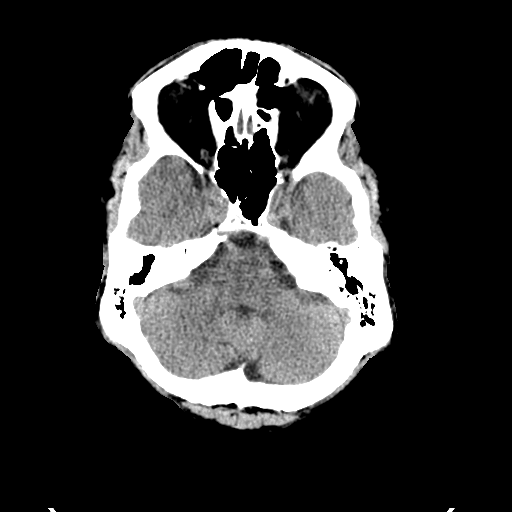
[im 9/32  brain]
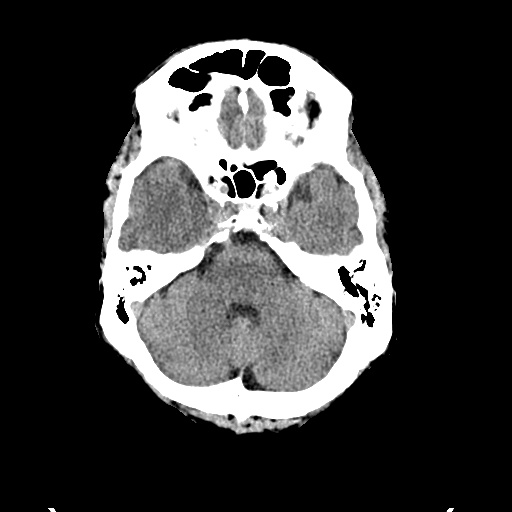
[im 9/32  bone]
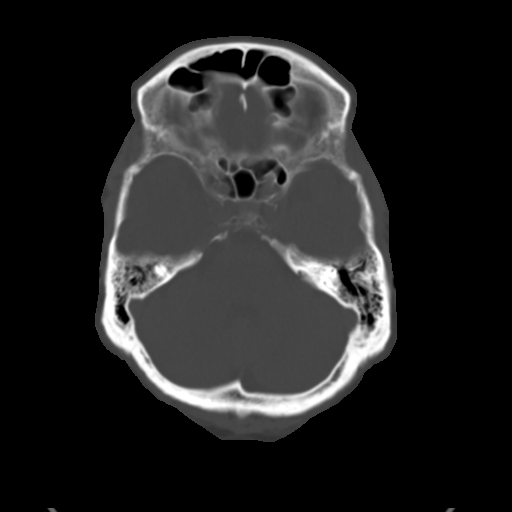
[im 11/32  brain]
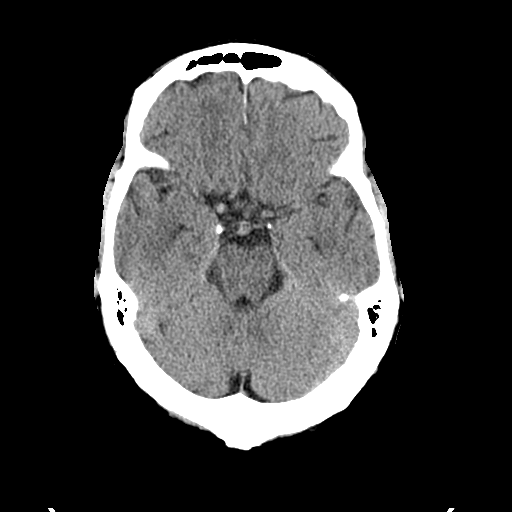
[im 13/32  brain]
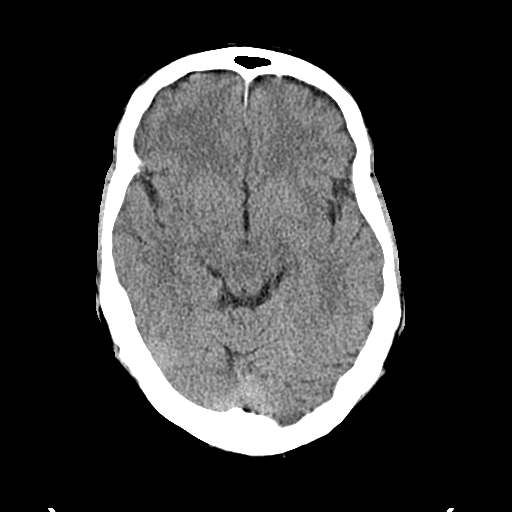
[im 15/32  brain]
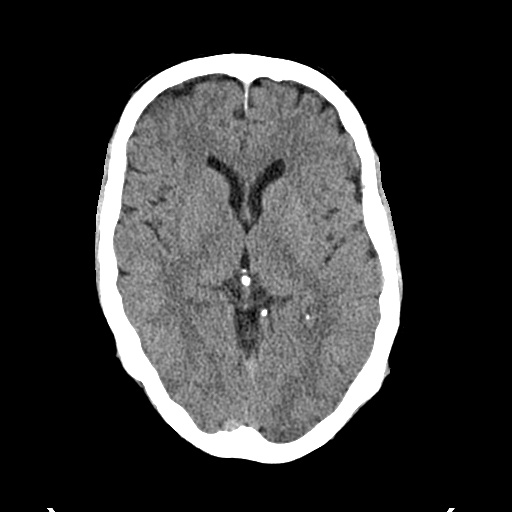
[im 17/32  brain]
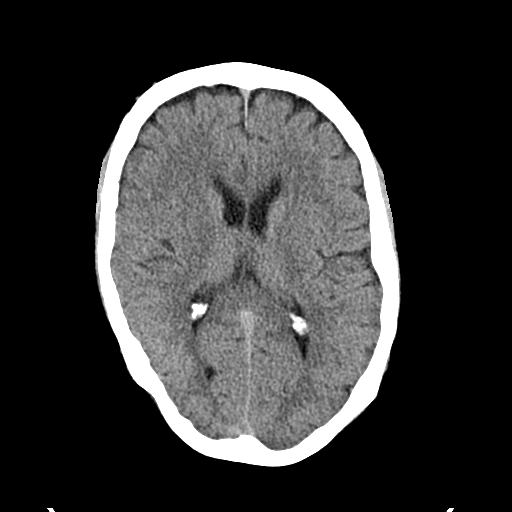
[im 17/32  bone]
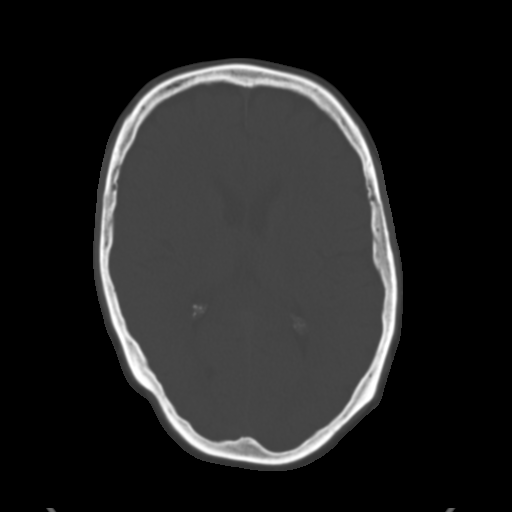
[im 19/32  brain]
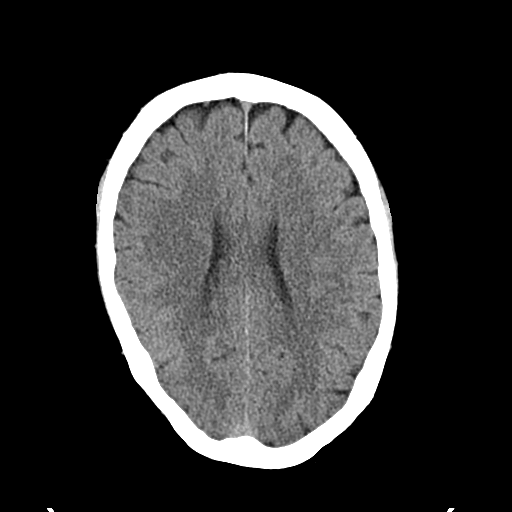
[im 21/32  brain]
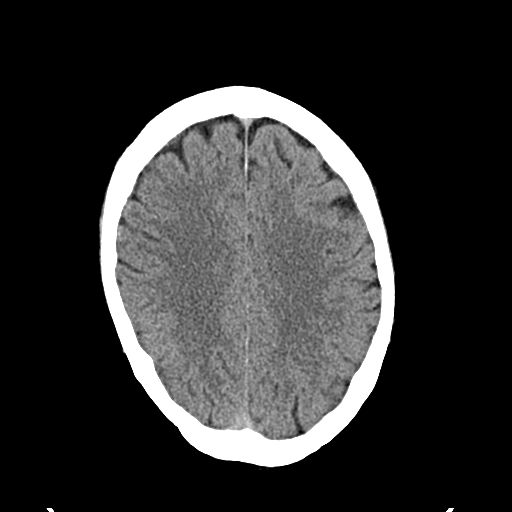
[im 23/32  brain]
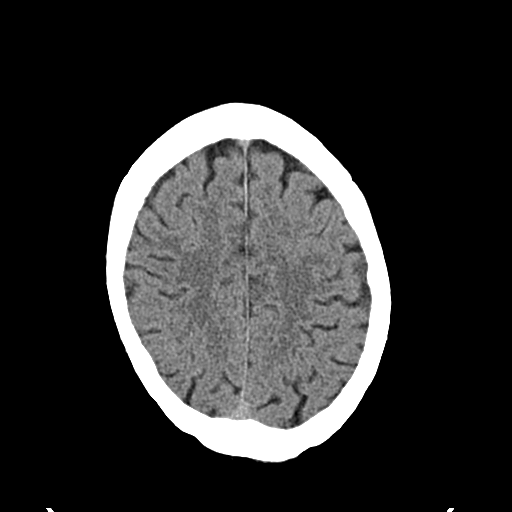
[im 24/32  brain]
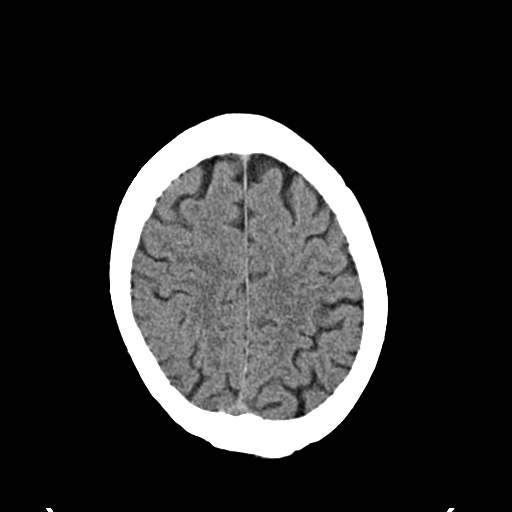
[im 24/32  bone]
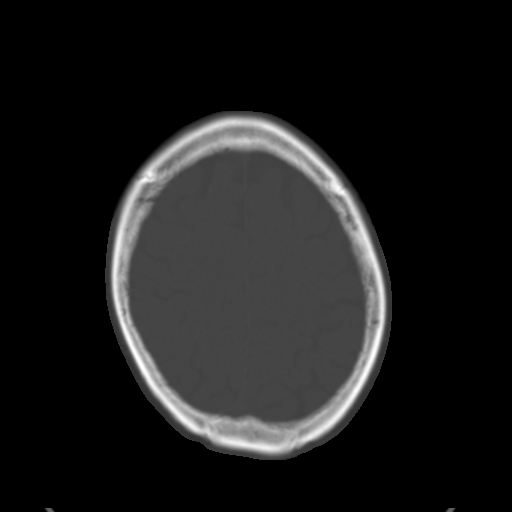
[im 26/32  brain]
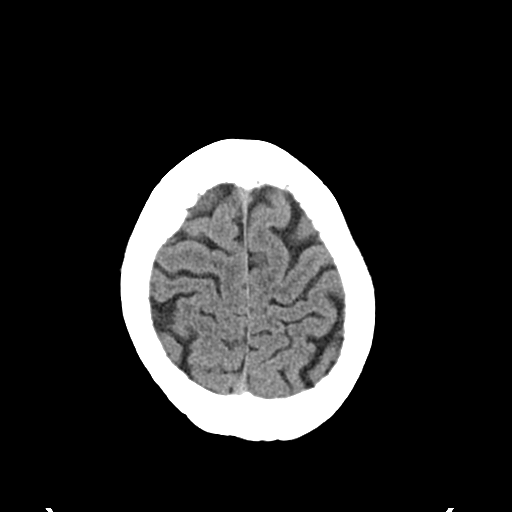
[im 28/32  brain]
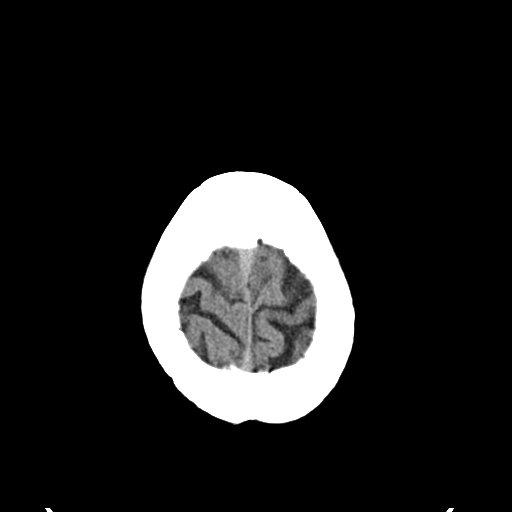
[im 30/32  brain]
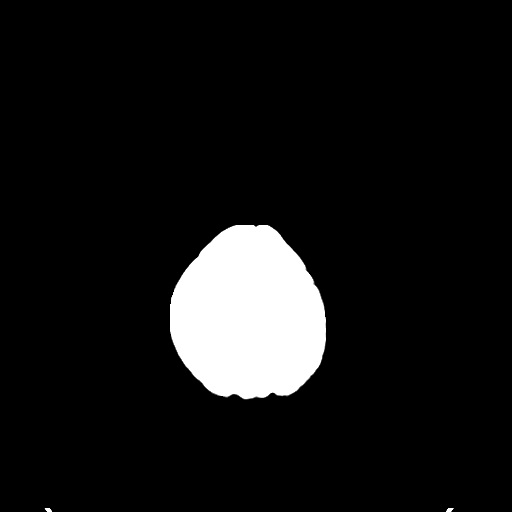

[16 of 30 positions shown; findings below may reference images not displayed]

FINDINGS: Visualized paranasal sinuses and mastoids are clear.  No
acute osseous abnormality identified.  Visualized orbits and scalp
soft tissues are within normal limits.

Cerebral volume is within normal limits for age.  Cerebral volume
is within normal limits for age.  No midline shift,
ventriculomegaly, mass effect, evidence of mass lesion,
intracranial hemorrhage or evidence of cortically based acute
infarction.  Gray-white matter differentiation is within normal
limits throughout the brain.  No suspicious intracranial vascular
hyperdensity.
IMPRESSION: Normal for age noncontrast CT appearance of the brain.

## 2014-08-05 ENCOUNTER — Other Ambulatory Visit: Payer: Self-pay | Admitting: Internal Medicine

## 2014-08-13 ENCOUNTER — Encounter: Payer: Self-pay | Admitting: Internal Medicine

## 2014-08-13 ENCOUNTER — Other Ambulatory Visit (INDEPENDENT_AMBULATORY_CARE_PROVIDER_SITE_OTHER): Payer: Medicare Other

## 2014-08-13 ENCOUNTER — Ambulatory Visit (INDEPENDENT_AMBULATORY_CARE_PROVIDER_SITE_OTHER): Payer: Medicare Other | Admitting: Internal Medicine

## 2014-08-13 VITALS — BP 110/74 | HR 61 | Temp 97.9°F | Ht 72.0 in | Wt 181.0 lb

## 2014-08-13 DIAGNOSIS — R7989 Other specified abnormal findings of blood chemistry: Secondary | ICD-10-CM | POA: Diagnosis not present

## 2014-08-13 DIAGNOSIS — Z0000 Encounter for general adult medical examination without abnormal findings: Secondary | ICD-10-CM

## 2014-08-13 DIAGNOSIS — E785 Hyperlipidemia, unspecified: Secondary | ICD-10-CM

## 2014-08-13 LAB — CBC WITH DIFFERENTIAL/PLATELET
Basophils Absolute: 0 10*3/uL (ref 0.0–0.1)
Basophils Relative: 0.4 % (ref 0.0–3.0)
EOS ABS: 0.1 10*3/uL (ref 0.0–0.7)
Eosinophils Relative: 2.3 % (ref 0.0–5.0)
HEMATOCRIT: 44 % (ref 39.0–52.0)
Hemoglobin: 14.8 g/dL (ref 13.0–17.0)
Lymphocytes Relative: 32.3 % (ref 12.0–46.0)
Lymphs Abs: 1.9 10*3/uL (ref 0.7–4.0)
MCHC: 33.6 g/dL (ref 30.0–36.0)
MCV: 87.3 fl (ref 78.0–100.0)
MONO ABS: 0.4 10*3/uL (ref 0.1–1.0)
Monocytes Relative: 6.6 % (ref 3.0–12.0)
NEUTROS PCT: 58.4 % (ref 43.0–77.0)
Neutro Abs: 3.4 10*3/uL (ref 1.4–7.7)
Platelets: 182 10*3/uL (ref 150.0–400.0)
RBC: 5.03 Mil/uL (ref 4.22–5.81)
RDW: 14.3 % (ref 11.5–15.5)
WBC: 5.9 10*3/uL (ref 4.0–10.5)

## 2014-08-13 LAB — LIPID PANEL
CHOL/HDL RATIO: 5
CHOLESTEROL: 216 mg/dL — AB (ref 0–200)
HDL: 47.5 mg/dL (ref >39.00)
NonHDL: 168.05
TRIGLYCERIDES: 319 mg/dL — AB (ref 0.0–149.0)
VLDL: 63.8 mg/dL — AB (ref 0.0–40.0)

## 2014-08-13 LAB — BASIC METABOLIC PANEL
BUN: 20 mg/dL (ref 6–23)
CO2: 32 mEq/L (ref 19–32)
Calcium: 9.7 mg/dL (ref 8.4–10.5)
Chloride: 100 mEq/L (ref 96–112)
Creatinine, Ser: 0.92 mg/dL (ref 0.40–1.50)
GFR: 87.16 mL/min (ref >60.00)
Glucose, Bld: 94 mg/dL (ref 70–99)
Potassium: 4.2 mEq/L (ref 3.5–5.1)
Sodium: 137 mEq/L (ref 135–145)

## 2014-08-13 LAB — HEPATIC FUNCTION PANEL
ALT: 13 U/L (ref 0–53)
AST: 16 U/L (ref 0–37)
Albumin: 4.2 g/dL (ref 3.5–5.2)
Alkaline Phosphatase: 61 U/L (ref 39–117)
BILIRUBIN DIRECT: 0.1 mg/dL (ref 0.0–0.3)
BILIRUBIN TOTAL: 0.6 mg/dL (ref 0.2–1.2)
Total Protein: 7.2 g/dL (ref 6.0–8.3)

## 2014-08-13 LAB — TSH: TSH: 1.39 u[IU]/mL (ref 0.35–4.50)

## 2014-08-13 LAB — LDL CHOLESTEROL, DIRECT: LDL DIRECT: 119 mg/dL

## 2014-08-13 MED ORDER — ATORVASTATIN CALCIUM 10 MG PO TABS: 10.0000 mg | ORAL_TABLET | Freq: Every day | ORAL | Status: DC

## 2014-08-13 MED ORDER — ZOLPIDEM TARTRATE 10 MG PO TABS: ORAL_TABLET | ORAL | Status: DC
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
## 2014-08-13 NOTE — Patient Instructions (Signed)
Please continue all other medications as before, and refills have been done if requested.  Please have the pharmacy call with any other refills you may need.  Please continue your efforts at being more active, low cholesterol diet, and weight control.  You are otherwise up to date with prevention measures today.  Please keep your appointments with your specialists as you may have planned - urology  Please go to the LAB in the Basement (turn left off the elevator) for the tests to be done today  You will be contacted by phone if any changes need to be made immediately.  Otherwise, you will receive a letter about your results with an explanation, but please check with MyChart first.  Please remember to sign up for MyChart if you have not done so, as this will be important to you in the future with finding out test results, communicating by private email, and scheduling acute appointments online when needed.  Please return in 1 year for your yearly visit, or sooner if needed, with Lab testing done 3-5 days before

## 2014-08-13 NOTE — Progress Notes (Signed)
Pre visit review using our clinic review tool, if applicable. No additional management support is needed unless otherwise documented below in the visit note. 

## 2014-08-13 NOTE — Assessment & Plan Note (Signed)

## 2014-08-13 NOTE — Assessment & Plan Note (Signed)
Has been out of statin recently, to re-start, check labs as ordered, for lower chol diet

## 2014-09-09 ENCOUNTER — Telehealth: Payer: Self-pay | Admitting: *Deleted

## 2014-09-09 MED ORDER — ATORVASTATIN CALCIUM 10 MG PO TABS: 10.0000 mg | ORAL_TABLET | Freq: Every day | ORAL | Status: DC

## 2014-09-09 NOTE — Telephone Encounter (Signed)
Receive call pt states saw md couple weeks ago for physical & he was going to send his cholesterol med to his pharmacy but they state they never receive. Per chart electronic tranmission failed inform pt [facility]...Jason Robles

## 2014-10-22 ENCOUNTER — Encounter: Payer: Medicare Other | Admitting: Internal Medicine

## 2014-10-22 ENCOUNTER — Telehealth: Payer: Self-pay | Admitting: Internal Medicine

## 2014-10-22 NOTE — Telephone Encounter (Signed)
Patient states he does not believe he needs appointment today.  He would rather be referred to cardiology so he can walk on a treadmill.  He wants to know if its ok to cancel appointment for today.  Please advise.

## 2014-10-22 NOTE — Telephone Encounter (Signed)
After speaking with Dr. Jenny Reichmann we did discover that patient does not need CPE.  Did notify patient per MD that in order to be referred to Cardiology to walk the treadmill he does have to have symptoms.  Patient did state he has no symptoms.  I told him he might want to try getting maybe a physical trainer if he was looking for something along the lines of health endurence.  Patient was ok with this.  We did schedule CPE for next August.

## 2015-01-11 ENCOUNTER — Telehealth: Payer: Self-pay | Admitting: Internal Medicine

## 2015-01-11 NOTE — Telephone Encounter (Signed)
Pt called in and is requesting call from the nurse.  He has some questions about his meds that he is taking.

## 2015-01-12 NOTE — Telephone Encounter (Signed)
Left message on machine for pt to return my call  

## 2015-01-13 NOTE — Telephone Encounter (Signed)
Left message on machine for pt to return my call, closing phone note until further contact from pt 

## 2015-01-13 NOTE — Telephone Encounter (Signed)
Pt has called back.  The best number is his work [phone]

## 2015-02-07 NOTE — Telephone Encounter (Signed)
Pt has called back wanting to go over his medication. Can you please give him a call at [phone]

## 2015-02-08 ENCOUNTER — Other Ambulatory Visit: Payer: Self-pay | Admitting: Internal Medicine

## 2015-02-08 NOTE — Telephone Encounter (Signed)
Please advise, patient needs refill on [place]

## 2015-02-08 NOTE — Telephone Encounter (Signed)
Done hardcopy to Corinne  

## 2015-02-08 NOTE — Telephone Encounter (Signed)
Medication printed signed and faxed to pharmacy  

## 2015-02-11 NOTE — Telephone Encounter (Signed)
Pt called stated [facility] does not have to rx for this med. Please check because he is leaving for 17 days next week and he does not want to go without it.

## 2015-02-12 ENCOUNTER — Other Ambulatory Visit: Payer: Self-pay | Admitting: Internal Medicine

## 2015-02-14 NOTE — Telephone Encounter (Signed)
Patient called back to follow up. Advised no notes that another call has been made at this point. [facility] is still advising that they have not received the script. Please call [facility] to [facility]. Thank you.

## 2015-02-14 NOTE — Telephone Encounter (Signed)
This has been called in

## 2015-02-15 ENCOUNTER — Telehealth: Payer: Self-pay | Admitting: Internal Medicine

## 2015-02-15 MED ORDER — ZOLPIDEM TARTRATE 10 MG PO TABS: 10.0000 mg | ORAL_TABLET | Freq: Every day | ORAL | Status: DC

## 2015-02-15 NOTE — Telephone Encounter (Signed)
pts wife called and stated [facility] never received the prescription for Ambien on 2/7. Can you please resend this to [facility] on [address]

## 2015-02-15 NOTE — Telephone Encounter (Signed)
Pt contacted and confirmed rx was picked up.

## 2015-02-15 NOTE — Telephone Encounter (Signed)
REPRINTED FOR PCP TO SIGN.   Rx has been faxed to [facility].

## 2015-04-01 ENCOUNTER — Telehealth: Payer: Self-pay | Admitting: Internal Medicine

## 2015-04-01 NOTE — Telephone Encounter (Signed)
Patient states that the ball of his heel on his right foot will swell and hurt.  Patient would like to know if Dr. Jenny Reichmann could treat this or if he should go to a podiatrist.

## 2015-04-01 NOTE — Telephone Encounter (Signed)
I would see a foot doctor, unless prefers to try to see Dr Tamala Julian [facility] in this office, thanks just let me know

## 2015-04-01 NOTE — Telephone Encounter (Signed)
Please advise, would this be something you would treat or does he need to go somewhere else?

## 2015-08-16 ENCOUNTER — Ambulatory Visit (INDEPENDENT_AMBULATORY_CARE_PROVIDER_SITE_OTHER): Payer: Medicare HMO | Admitting: Internal Medicine

## 2015-08-16 ENCOUNTER — Encounter: Payer: Self-pay | Admitting: Internal Medicine

## 2015-08-16 VITALS — BP 126/74 | HR 69 | Temp 97.9°F | Resp 20 | Wt 179.0 lb

## 2015-08-16 DIAGNOSIS — Z0001 Encounter for general adult medical examination with abnormal findings: Secondary | ICD-10-CM | POA: Diagnosis not present

## 2015-08-16 DIAGNOSIS — H1010 Acute atopic conjunctivitis, unspecified eye: Secondary | ICD-10-CM

## 2015-08-16 DIAGNOSIS — Z1159 Encounter for screening for other viral diseases: Secondary | ICD-10-CM | POA: Diagnosis not present

## 2015-08-16 DIAGNOSIS — E785 Hyperlipidemia, unspecified: Secondary | ICD-10-CM | POA: Diagnosis not present

## 2015-08-16 DIAGNOSIS — H1013 Acute atopic conjunctivitis, bilateral: Secondary | ICD-10-CM | POA: Diagnosis not present

## 2015-08-16 DIAGNOSIS — G459 Transient cerebral ischemic attack, unspecified: Secondary | ICD-10-CM | POA: Diagnosis not present

## 2015-08-16 DIAGNOSIS — R6889 Other general symptoms and signs: Secondary | ICD-10-CM | POA: Diagnosis not present

## 2015-08-16 DIAGNOSIS — G4700 Insomnia, unspecified: Secondary | ICD-10-CM | POA: Diagnosis not present

## 2015-08-16 HISTORY — DX: Acute atopic conjunctivitis, unspecified eye: H10.10

## 2015-08-16 HISTORY — DX: Insomnia, unspecified: G47.00

## 2015-08-16 MED ORDER — AZELASTINE HCL 0.05 % OP SOLN: 1.0000 [drp] | Freq: Two times a day (BID) | OPHTHALMIC | 12 refills | Status: DC | PRN

## 2015-08-16 MED ORDER — ZOLPIDEM TARTRATE 10 MG PO TABS: 10.0000 mg | ORAL_TABLET | Freq: Every day | ORAL | 1 refills | Status: DC

## 2015-08-16 NOTE — Progress Notes (Signed)
Pre visit review using our clinic review tool, if applicable. No additional management support is needed unless otherwise documented below in the visit note. 

## 2015-08-16 NOTE — Assessment & Plan Note (Signed)
Stable, to cont asa

## 2015-08-16 NOTE — Assessment & Plan Note (Signed)
Chronic persistent, for ambien refill,  to f/u any worsening symptoms or concerns

## 2015-08-16 NOTE — Progress Notes (Signed)
Subjective:    Patient ID: Jason Robles, male    DOB: 1945/06/15, 70 y.o.   MRN: [id number]  HPI  Here for wellness and f/u;  Overall doing ok;  Pt denies Chest pain, worsening SOB, DOE, wheezing, orthopnea, PND, worsening LE edema, palpitations, dizziness or syncope.  Pt denies polydipsia, polyuria, or low sugar symptoms. Pt states overall good compliance with treatment and medications, good tolerability, and has been trying to follow appropriate diet.  PNo fever, night sweats, wt loss, loss of appetite, or other constitutional symptoms.  Pt states good ability with ADL's, has low fall risk, home safety reviewed and adequate, no other significant changes in hearing or vision, and only occasionally active with exercise.  Declines flu and pneumovax for now. 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 Wt Readings from Last 3 Encounters:  08/16/15 179 lb (81.2 kg)  08/13/14 181 lb (82.1 kg)  03/26/13 179 lb 9.6 oz (81.5 kg)    Having eye allergy symtpoms with matting in the eyes each AM for the past 6 wks, using otc eye drops but only helps a little bit, not sure if zaditor or not.   Also has ongoing insomnia persistent, has difficulty getting to sleep most nights, requests refill Lorrin Mais  Denies worsening depressive symptoms, suicidal ideation, or panic; Denies worsening reflux, abd pain, dysphagia, n/v, bowel change or blood. Denies urinary symptoms such as dysuria, frequency, urgency, flank pain, hematuria or n/v, fever, chills.  Pt denies new neurological symptoms such as new headache, or facial or extremity weakness or numbness Past Medical History:  Diagnosis Date  . ABDOMINAL PAIN OTHER SPECIFIED SITE 04/30/2008  . ALLERGIC RHINITIS 08/25/2007  . Anxiety 06/28/2010  . BENIGN PROSTATIC HYPERTROPHY 08/21/2006  . Chest pain 06/28/2010  . COLONIC POLYPS, HX OF 04/18/2007  . 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 Diverticulosis of colon (without mention of  hemorrhage) 04/18/2007  . EXTERNAL HEMORRHOIDS 04/18/2007  . Headache(784.0) 09/20/2009  . HEARING LOSS, 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  . HYPERLIPIDEMIA 08/21/2006  . INSOMNIA-SLEEP DISORDER-UNSPEC 09/20/2009  . NONSPECIFIC ABN FINDING RAD & OTH EXAM GU ORGAN 05/03/2008  . PERIPHERAL VASCULAR DISEASE 08/25/2007  . Raynaud's syndrome 08/21/2006   Past Surgical History:  Procedure Laterality Date  . APPENDECTOMY    . s/p knee surgery     x's 4 since 1980  . s/p left shoulder  2002   rotator cuff  . s/p lumbar disc surgery  2007    reports that he has quit smoking. His smoking use included Cigarettes. He has a 15.00 pack-year smoking history. He has never used smokeless tobacco. He reports that he drinks about 1.8 oz of alcohol per week . He reports that he does not use drugs. family history includes Alzheimer's disease in his mother; Diabetes in his maternal uncle and mother; Kidney cancer in his brother. Allergies  Allergen Reactions  . Iohexol      Desc: PT developed itching post 125cc Omni injection. Treated w/ 50 mg PO benedryl and seen by Dr Hyman Hopes prior to release., Onset Date: [id number]    Current Outpatient Prescriptions on File Prior to Visit  Medication Sig Dispense Refill  . Acetaminophen (TYLENOL ARTHRITIS PAIN PO) Take 2 tablets by mouth daily as needed (body aches).    [name] aspirin 81 MG tablet Take 81 mg by mouth daily.      [name] atorvastatin (LIPITOR) 10 MG tablet Take 1 tablet (10 mg total) by mouth daily. 90 tablet  3   No current facility-administered medications on file prior to visit.    Review of Systems Constitutional: Negative for increased diaphoresis, or other activity, appetite or siginficant weight change other than noted HENT: Negative for worsening hearing loss, ear pain, facial swelling, mouth sores and neck stiffness.   Eyes: Negative for other worsening pain, redness or visual disturbance.  Respiratory: Negative for choking or stridor Cardiovascular: Negative for other  chest pain and palpitations.  Gastrointestinal: Negative for worsening diarrhea, blood in stool, or abdominal distention Genitourinary: Negative for hematuria, flank pain or change in urine volume.  Musculoskeletal: Negative for myalgias or other joint complaints.  Skin: Negative for other color change and wound or drainage.  Neurological: Negative for syncope and numbness. other than noted Hematological: Negative for adenopathy. or other swelling Psychiatric/Behavioral: Negative for hallucinations, SI, self-injury, decreased concentration or other worsening agitation.      Objective:   Physical Exam BP 126/74   Pulse 69   Temp 97.9 F (36.6 C) (Oral)   Resp 20   Wt 179 lb (81.2 kg)   SpO2 95%   BMI 24.28 kg/m  VS noted,  Constitutional: Pt is oriented to person, place, and time. Appears well-developed and well-nourished, in no significant distress Head: Normocephalic and atraumatic  Eyes: Conjunctivae with mild erythema and clearish d/c, and EOM are normal. Pupils are equal, round, and reactive to light Right Ear: External ear normal.  Left Ear: External ear normal Nose: Nose normal.  Mouth/Throat: Oropharynx is clear and moist  Neck: Normal range of motion. Neck supple. No JVD present. No tracheal deviation present or significant neck LA or mass Cardiovascular: Normal rate, regular rhythm, normal heart sounds and intact distal pulses.   Pulmonary/Chest: Effort normal and breath sounds without rales or wheezing  Abdominal: Soft. Bowel sounds are normal. NT. No HSM  Musculoskeletal: Normal range of motion. Exhibits no edema Lymphadenopathy: Has no cervical adenopathy.  Neurological: Pt is alert and oriented to person, place, and time. Pt has normal reflexes. No cranial nerve deficit. Motor grossly intact Skin: Skin is warm and dry. No rash noted or new ulcers Psychiatric:  Has normal mood and affect. Behavior is normal.     Assessment & Plan:

## 2015-08-16 NOTE — Patient Instructions (Signed)
Please take all new medication as prescribed - the optivar for eye allergies  Please continue all other medications as before, and refills have been done if requested - the ambien  Please have the pharmacy call with any other refills you may need.  Please continue your efforts at being more active, low cholesterol diet, and weight control.  You are otherwise up to date with prevention measures today.  Please keep your appointments with your specialists as you may have planned  Please return in 1 year for your yearly visit, or sooner if needed, with Lab testing done 3-5 days before

## 2015-08-16 NOTE — Assessment & Plan Note (Signed)
stable overall by history and exam, recent data reviewed with pt, and pt to continue medical treatment as before,  to f/u any worsening symptoms or concerns, for f/u lab today 

## 2015-08-16 NOTE — Assessment & Plan Note (Signed)

## 2015-08-16 NOTE — Assessment & Plan Note (Addendum)
Mild to mod, for optivar asd,  to f/u any worsening symptoms or concerns  In addition to the time spent performing CPE, I spent an additional 25 minutes face to face,in which greater than 50% of this time was spent in counseling and coordination of care for patient's acute illness as documented.

## 2015-08-25 ENCOUNTER — Other Ambulatory Visit: Payer: Self-pay | Admitting: Internal Medicine

## 2015-08-25 NOTE — Telephone Encounter (Signed)
Jason Robles too soon today as was just done aug 15, unless the rx has been lost

## 2015-11-02 ENCOUNTER — Other Ambulatory Visit: Payer: Self-pay | Admitting: Internal Medicine

## 2015-11-04 ENCOUNTER — Encounter: Payer: Self-pay | Admitting: Internal Medicine

## 2015-11-04 ENCOUNTER — Ambulatory Visit (INDEPENDENT_AMBULATORY_CARE_PROVIDER_SITE_OTHER): Payer: Medicare HMO | Admitting: Internal Medicine

## 2015-11-04 VITALS — BP 128/78 | HR 69 | Temp 98.7°F | Resp 20 | Wt 182.0 lb

## 2015-11-04 DIAGNOSIS — R202 Paresthesia of skin: Secondary | ICD-10-CM | POA: Diagnosis not present

## 2015-11-04 DIAGNOSIS — E785 Hyperlipidemia, unspecified: Secondary | ICD-10-CM | POA: Diagnosis not present

## 2015-11-04 DIAGNOSIS — F419 Anxiety disorder, unspecified: Secondary | ICD-10-CM

## 2015-11-04 NOTE — Progress Notes (Signed)
Pre visit review using our clinic review tool, if applicable. No additional management support is needed unless otherwise documented below in the visit note. 

## 2015-11-04 NOTE — Patient Instructions (Addendum)
Please call if your symptoms become worse, to be considered for neurology referral  Please continue all other medications as before, and refills have been done if requested.  Please have the pharmacy call with any other refills you may need.  Please keep your appointments with your specialists as you may have planned

## 2015-11-04 NOTE — Progress Notes (Signed)
Subjective:    Patient ID: Jason Robles, male    DOB: Nov 01, 1945, 70 y.o.   MRN: [id number]  HPI   Here to f/u with an unusual complaint of spiderweb kind of feeling only to right lower third face and right neck/upper back pointing along the trapezoid, only last for a 15 seconds without pain, weakness of neck, arms or legs, intermittent mild for 2 wks, and only occurs with standing up from a sitting position, but not with sitting down, other standing or walking,  Pt denies chest pain, increasing sob or doe, wheezing, orthopnea, PND, increased LE swelling, palpitations, dizziness or syncope.  Pt denies other new neurological symptoms such as new headache, or facial or extremity weakness.  Pt denies polydipsia, polyuria,  Pt states overall good compliance with meds, mostly trying to follow appropriate lower chol diet, with wt overall stable  Denies worsening depressive symptoms, suicidal ideation, or panic Wt Readings from Last 3 Encounters:  11/04/15 182 lb (82.6 kg)  08/16/15 179 lb (81.2 kg)  08/13/14 181 lb (82.1 kg)   Past Medical History:  Diagnosis Date  . ABDOMINAL PAIN OTHER SPECIFIED SITE 04/30/2008  . ALLERGIC RHINITIS 08/25/2007  . Anxiety 06/28/2010  . BENIGN PROSTATIC HYPERTROPHY 08/21/2006  . Chest pain 06/28/2010  . COLONIC POLYPS, HX OF 04/18/2007  . 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 Diverticulosis of colon (without mention of hemorrhage) 04/18/2007  . EXTERNAL HEMORRHOIDS 04/18/2007  . Headache(784.0) 09/20/2009  . HEARING LOSS, 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  . HYPERLIPIDEMIA 08/21/2006  . INSOMNIA-SLEEP DISORDER-UNSPEC 09/20/2009  . NONSPECIFIC ABN FINDING RAD & OTH EXAM GU ORGAN 05/03/2008  . PERIPHERAL VASCULAR DISEASE 08/25/2007  . Raynaud's syndrome 08/21/2006   Past Surgical History:  Procedure Laterality Date  . APPENDECTOMY    . s/p knee surgery     x's 4 since 1980  . s/p left shoulder  2002   rotator cuff  . s/p lumbar disc surgery  2007    reports that he has quit smoking. His smoking use included Cigarettes. He has a 15.00 pack-year smoking history. He has never used smokeless tobacco. He reports that he drinks about 1.8 oz of alcohol per week . He reports that he does not use drugs. family history includes Alzheimer's disease in his mother; Diabetes in his maternal uncle and mother; Kidney cancer in his brother. Allergies  Allergen Reactions  . Iohexol      Desc: PT developed itching post 125cc Omni injection. Treated w/ 50 mg PO benedryl and seen by Dr Hyman Hopes prior to release., Onset Date: [id number]    Current Outpatient Prescriptions on File Prior to Visit  Medication Sig Dispense Refill  . Acetaminophen (TYLENOL ARTHRITIS PAIN PO) Take 2 tablets by mouth daily as needed (body aches).    [name] aspirin 81 MG tablet Take 81 mg by mouth daily.      [name] atorvastatin (LIPITOR) 10 MG tablet TAKE 1 TABLET(10 MG) BY MOUTH DAILY 90 tablet 0  . azelastine (OPTIVAR) 0.05 % ophthalmic solution Place 1 drop into both eyes 2 (two) times daily as needed. 6 mL 12  . zolpidem (AMBIEN) 10 MG tablet Take 1 tablet (10 mg total) by mouth at bedtime. 90 tablet 1   No current facility-administered medications on file prior to visit.    Review of Systems  Constitutional: Negative for unusual diaphoresis or night sweats HENT: Negative for ear swelling or discharge Eyes: Negative for worsening visual haziness  Respiratory: Negative for choking and stridor.  Gastrointestinal: Negative for distension or worsening eructation Genitourinary: Negative for retention or change in urine volume.  Musculoskeletal: Negative for other MSK pain or swelling Skin: Negative for color change and worsening wound Neurological: Negative for tremors and numbness other than noted  Psychiatric/Behavioral: Negative for decreased concentration or agitation other than above   All other system neg per pt    Objective:   Physical Exam BP 128/78   Pulse 69   Temp 98.7 F  (37.1 C) (Oral)   Resp 20   Wt 182 lb (82.6 kg)   SpO2 96%   BMI 24.68 kg/m  VS noted,  Constitutional: Pt appears in no apparent distress HENT: Head: NCAT.  Right Ear: External ear normal.  Left Ear: External ear normal.  Eyes: . Pupils are equal, round, and reactive to light. Conjunctivae and EOM are normal Neck: Normal range of motion. Neck supple.  Cardiovascular: Normal rate and regular rhythm.   Pulmonary/Chest: Effort normal and breath sounds without rales or wheezing.  Abd:  Soft, NT, ND, + BS Neurological: Pt is alert. Not confused , motor 5/5, intact, CN 2-12 intact, sens/dtr intact Skin: Skin is warm. No rash, no LE edema Psychiatric: Pt behavior is normal. No agitation. no significant anxiety today    Assessment & Plan:

## 2015-11-06 NOTE — Assessment & Plan Note (Addendum)
Mild increased last yr, d/w pt o/w stable overall by history and exam, recent data reviewed with pt, and pt for lower chol diet and cont statin,  to f/u any worsening symptoms or concerns, f/u lab next visit Lab Results  Component Value Date   CHOL 216 (H) 08/13/2014   HDL 47.50 08/13/2014   LDLDIRECT 119.0 08/13/2014   TRIG 319.0 (H) 08/13/2014   CHOLHDL 5 08/13/2014

## 2015-11-06 NOTE — Assessment & Plan Note (Addendum)
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

## 2015-11-06 NOTE — Assessment & Plan Note (Signed)
stable overall by history and exam, recent data reviewed with pt, and pt to continue medical treatment as before,  to f/u any worsening symptoms or concerns Lab Results  Component Value Date   WBC 5.9 08/13/2014   HGB 14.8 08/13/2014   HCT 44.0 08/13/2014   PLT 182.0 08/13/2014   GLUCOSE 94 08/13/2014   CHOL 216 (H) 08/13/2014   TRIG 319.0 (H) 08/13/2014   HDL 47.50 08/13/2014   LDLDIRECT 119.0 08/13/2014   ALT 13 08/13/2014   AST 16 08/13/2014   NA 137 08/13/2014   K 4.2 08/13/2014   CL 100 08/13/2014   CREATININE 0.92 08/13/2014   BUN 20 08/13/2014   CO2 32 08/13/2014   TSH 1.39 08/13/2014   PSA 4.06 (H) 03/26/2012   INR 0.89 05/02/2012   HGBA1C 5.6 05/02/2012

## 2015-11-10 ENCOUNTER — Other Ambulatory Visit: Payer: Self-pay | Admitting: Orthopedic Surgery

## 2015-11-11 ENCOUNTER — Ambulatory Visit (INDEPENDENT_AMBULATORY_CARE_PROVIDER_SITE_OTHER): Payer: Medicare HMO | Admitting: Internal Medicine

## 2015-11-11 ENCOUNTER — Encounter: Payer: Self-pay | Admitting: Internal Medicine

## 2015-11-11 VITALS — BP 136/80 | HR 72 | Resp 20 | Wt 183.0 lb

## 2015-11-11 DIAGNOSIS — M67431 Ganglion, right wrist: Secondary | ICD-10-CM

## 2015-11-11 DIAGNOSIS — R202 Paresthesia of skin: Secondary | ICD-10-CM | POA: Diagnosis not present

## 2015-11-11 DIAGNOSIS — F419 Anxiety disorder, unspecified: Secondary | ICD-10-CM

## 2015-11-11 HISTORY — DX: Ganglion, right wrist: M67.431

## 2015-11-11 NOTE — Assessment & Plan Note (Signed)
Resolved,  to f/u any worsening symptoms or concerns  

## 2015-11-11 NOTE — Progress Notes (Signed)
Subjective:    Patient ID: Jason Robles, male    DOB: July 21, 1945, 70 y.o.   MRN: [id number]  HPI  Here with 3 days onset right wrist mild to mod pain swelling tender area anterolat aspect, constant, worse pain to bend the wrist, much worse after a day of increased right hand use, worst yesterday, but some reduced size/tender/sweling today with rest, no prior hx of same.  No fever, trauma or RUE pain/weakenss or numbness.  Pt denies chest pain, increased sob or doe, wheezing, orthopnea, PND, increased LE swelling, palpitations, dizziness or syncope.  Pt denies polydipsia, polyuria,  Asks for flu shot  No further right face or neck paresthesias.   Denies worsening depressive symptoms, suicidal ideation, or panic Past Medical History:  Diagnosis Date  . ABDOMINAL PAIN OTHER SPECIFIED SITE 04/30/2008  . ALLERGIC RHINITIS 08/25/2007  . Anxiety 06/28/2010  . BENIGN PROSTATIC HYPERTROPHY 08/21/2006  . Chest pain 06/28/2010  . COLONIC POLYPS, HX OF 04/18/2007  . 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 Diverticulosis of colon (without mention of hemorrhage) 04/18/2007  . EXTERNAL HEMORRHOIDS 04/18/2007  . Headache(784.0) 09/20/2009  . HEARING LOSS, 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  . HYPERLIPIDEMIA 08/21/2006  . INSOMNIA-SLEEP DISORDER-UNSPEC 09/20/2009  . NONSPECIFIC ABN FINDING RAD & OTH EXAM GU ORGAN 05/03/2008  . PERIPHERAL VASCULAR DISEASE 08/25/2007  . Raynaud's syndrome 08/21/2006   Past Surgical History:  Procedure Laterality Date  . APPENDECTOMY    . s/p knee surgery     x's 4 since 1980  . s/p left shoulder  2002   rotator cuff  . s/p lumbar disc surgery  2007    reports that he has quit smoking. His smoking use included Cigarettes. He has a 15.00 pack-year smoking history. He has never used smokeless tobacco. He reports that he drinks about 1.8 oz of alcohol per week . He reports that he does not use drugs. family history includes Alzheimer's disease in his mother; Diabetes in his  maternal uncle and mother; Kidney cancer in his brother. Allergies  Allergen Reactions  . Iohexol      Desc: PT developed itching post 125cc Omni injection. Treated w/ 50 mg PO benedryl and seen by Dr Hyman Hopes prior to release., Onset Date: [id number]    Current Outpatient Prescriptions on File Prior to Visit  Medication Sig Dispense Refill  . Acetaminophen (TYLENOL ARTHRITIS PAIN PO) Take 2 tablets by mouth daily as needed (body aches).    [name] aspirin 81 MG tablet Take 81 mg by mouth daily.      [name] atorvastatin (LIPITOR) 10 MG tablet TAKE 1 TABLET(10 MG) BY MOUTH DAILY 90 tablet 0  . azelastine (OPTIVAR) 0.05 % ophthalmic solution Place 1 drop into both eyes 2 (two) times daily as needed. 6 mL 12  . zolpidem (AMBIEN) 10 MG tablet Take 1 tablet (10 mg total) by mouth at bedtime. 90 tablet 1   No current facility-administered medications on file prior to visit.    Review of Systems  Constitutional: Negative for unusual diaphoresis or night sweats HENT: Negative for ear swelling or discharge Eyes: Negative for worsening visual haziness  Respiratory: Negative for choking and stridor.   Gastrointestinal: Negative for distension or worsening eructation Genitourinary: Negative for retention or change in urine volume.  Musculoskeletal: Negative for other MSK pain or swelling Skin: Negative for color change and worsening wound Neurological: Negative for tremors and numbness other than noted  Psychiatric/Behavioral: Negative for decreased concentration or agitation other than above  All other system neg per pt    Objective:   Physical Exam BP 136/80   Pulse 72   Resp 20   Wt 183 lb (83 kg)   SpO2 98%   BMI 24.82 kg/m  VS noted,  Constitutional: Pt appears in no apparent distress HENT: Head: NCAT.  Right Ear: External ear normal.  Left Ear: External ear normal.  Eyes: . Pupils are equal, round, and reactive to light. Conjunctivae and EOM are normal Neck: Normal range of motion. Neck  supple.  Cardiovascular: Normal rate and regular rhythm.   Pulmonary/Chest: Effort normal and breath sounds without rales or wheezing.  Neurological: Pt is alert. Not confused , motor grossly intact Skin: Skin is warm. No rash, no LE edema Right wrist with anterolat subq 1 cm area firm cystic like mass/tenderswelling Psychiatric: Pt behavior is normal. No agitation. mild nervous No other new exam findings.    Assessment & Plan:

## 2015-11-11 NOTE — Progress Notes (Signed)
Pre visit review using our clinic review tool, if applicable. No additional management support is needed unless otherwise documented below in the visit note. 

## 2015-11-11 NOTE — Assessment & Plan Note (Signed)
Mild to mod, improved today with rest, d/w pt dx and natural history, declines hand surgury referral,  to f/u any worsening symptoms or concerns

## 2015-11-11 NOTE — Patient Instructions (Signed)
You had the flu shot today  Please continue all other medications as before, and refills have been done if requested.  Please have the pharmacy call with any other refills you may need.  Please keep your appointments with your specialists as you may have planned  Please call if you change your mind about the referral to Hand Surgury

## 2015-11-11 NOTE — Assessment & Plan Note (Signed)
Mild situational worse,  No further specific tx needed, to f/u any worsening symptoms or concerns

## 2016-01-27 ENCOUNTER — Encounter (HOSPITAL_COMMUNITY): Payer: Self-pay

## 2016-01-27 NOTE — Pre-Procedure Instructions (Addendum)
Jaelin Callo Duhon  01/27/2016      [id number] - [place] [address], [address] AT [facility] [address] [facility] [address] [place] [address] Phone: [phone] Fax: [phone]  [id number] - [place], [place] - [address] [place] [address] Phone: [phone] Fax: [phone]    Your procedure is scheduled on Monday, February 06, 2016   Report to [facility] [email or web address]             (posted surgery time 7:30 - 8:45 am)   Call this number if you have problems the MORNING of surgery:  [phone].  Short Stay is NOT open on weekends.   Remember:  Do not eat food or drink liquids after midnight Sunday.   Take these medicines the morning of surgery with A SIP OF WATER : Tylenol   7 days prior to surgery STOP taking any Aspirin, Aleve, Naproxen, Ibuprofen, Motrin, Advil, Goody's, BC's, all herbal medications, fish oil, and all vitamins               Do not wear jewelry - no rings or watches  Do not wear lotions, powders, cologne, or deodorant.  Men may shave face and neck.   Do not bring valuables to the hospital.  [facility] is not responsible for any belongings or valuables.  Contacts, dentures or bridgework may not be worn into surgery.  Leave your suitcase in the car.  After surgery it may be brought to your room. For patients admitted to the hospital, discharge time will be determined by your treatment team.                                                               Preparing For Surgery  Before surgery, you can play an important role. Because skin is not sterile, your skin needs to be as free of germs as possible. You can reduce the number of germs on your skin by washing with CHG (chlorahexidine gluconate) Soap before surgery.  CHG is an antiseptic cleaner which kills germs and bonds with the skin to continue killing germs even after washing.  Please do not use if you  have an allergy to CHG or antibacterial soaps. If your skin becomes reddened/irritated stop using the CHG.  Do not shave (including legs and underarms) for at least 48 hours prior to first CHG shower. It is OK to shave your face.  Please follow these instructions carefully.   1. Shower the NIGHT BEFORE SURGERY and the MORNING OF SURGERY with CHG.   2. If you chose to wash your hair, wash your hair first as usual with your normal shampoo.  3. After you shampoo, rinse your hair and body thoroughly to remove the shampoo.  4. Use CHG as you would any other liquid soap. You can apply CHG directly to the skin and wash gently with a scrungie or a clean washcloth.   5. Apply the CHG Soap to your body ONLY FROM THE NECK DOWN.  Do not use on open wounds or open sores. Avoid contact with your eyes, ears, mouth and genitals (private parts). Wash genitals (private parts) with your normal soap.  6. Wash thoroughly, paying  special attention to the area where your surgery will be performed.  7. Thoroughly rinse your body with warm water from the neck down.  8. DO NOT shower/wash with your normal soap after using and rinsing off the CHG Soap.  9. Pat yourself dry with a CLEAN TOWEL.   10. Wear CLEAN PAJAMAS   11. Place CLEAN SHEETS on your bed the night of your first shower and DO NOT SLEEP WITH PETS.    Day of Surgery: Do not apply any deodorants/lotions. Please wear clean clothes to the hospital/surgery center.      Please read over the following fact sheets that you were given. Pain Booklet, MRSA Information and Surgical Site Infection Prevention

## 2016-01-30 ENCOUNTER — Inpatient Hospital Stay (HOSPITAL_COMMUNITY)
Admission: RE | Admit: 2016-01-30 | Discharge: 2016-01-30 | Disposition: A | Discharge status: Home / Self Care | Payer: Medicare HMO | Source: Ambulatory Visit

## 2016-01-31 ENCOUNTER — Encounter (HOSPITAL_COMMUNITY)
Admission: RE | Admit: 2016-01-31 | Discharge: 2016-01-31 | Disposition: A | Discharge status: Home / Self Care | Payer: Medicare HMO | Source: Ambulatory Visit | Attending: Orthopedic Surgery | Admitting: Orthopedic Surgery

## 2016-01-31 ENCOUNTER — Encounter (HOSPITAL_COMMUNITY): Payer: Self-pay

## 2016-01-31 ENCOUNTER — Other Ambulatory Visit: Payer: Self-pay

## 2016-01-31 DIAGNOSIS — Z01818 Encounter for other preprocedural examination: Secondary | ICD-10-CM | POA: Insufficient documentation

## 2016-01-31 DIAGNOSIS — M1712 Unilateral primary osteoarthritis, left knee: Secondary | ICD-10-CM | POA: Insufficient documentation

## 2016-01-31 HISTORY — DX: Other specified postprocedural states: Z98.890

## 2016-01-31 HISTORY — DX: Other complications of anesthesia, initial encounter: T88.59XA

## 2016-01-31 HISTORY — DX: Adverse effect of unspecified anesthetic, initial encounter: T41.45XA

## 2016-01-31 HISTORY — DX: Nausea with vomiting, unspecified: R11.2

## 2016-01-31 LAB — CBC WITH DIFFERENTIAL/PLATELET
Basophils Absolute: 0 10*3/uL (ref 0.0–0.1)
Basophils Relative: 0 %
EOS PCT: 1 %
Eosinophils Absolute: 0.1 10*3/uL (ref 0.0–0.7)
HCT: 41.6 % (ref 39.0–52.0)
Hemoglobin: 14 g/dL (ref 13.0–17.0)
LYMPHS ABS: 2.1 10*3/uL (ref 0.7–4.0)
LYMPHS PCT: 29 %
MCH: 29.3 pg (ref 26.0–34.0)
MCHC: 33.7 g/dL (ref 30.0–36.0)
MCV: 87 fL (ref 78.0–100.0)
MONO ABS: 0.4 10*3/uL (ref 0.1–1.0)
Monocytes Relative: 5 %
Neutro Abs: 4.6 10*3/uL (ref 1.7–7.7)
Neutrophils Relative %: 65 %
PLATELETS: 141 10*3/uL — AB (ref 150–400)
RBC: 4.78 MIL/uL (ref 4.22–5.81)
RDW: 13 % (ref 11.5–15.5)
WBC: 7.1 10*3/uL (ref 4.0–10.5)

## 2016-01-31 LAB — COMPREHENSIVE METABOLIC PANEL
ALT: 20 U/L (ref 17–63)
AST: 22 U/L (ref 15–41)
Albumin: 3.8 g/dL (ref 3.5–5.0)
Alkaline Phosphatase: 61 U/L (ref 38–126)
Anion gap: 7 (ref 5–15)
BILIRUBIN TOTAL: 0.7 mg/dL (ref 0.3–1.2)
BUN: 16 mg/dL (ref 6–20)
CALCIUM: 9.7 mg/dL (ref 8.9–10.3)
CHLORIDE: 103 mmol/L (ref 101–111)
CO2: 27 mmol/L (ref 22–32)
CREATININE: 0.93 mg/dL (ref 0.61–1.24)
Glucose, Bld: 94 mg/dL (ref 65–99)
Potassium: 4.4 mmol/L (ref 3.5–5.1)
Sodium: 137 mmol/L (ref 135–145)
TOTAL PROTEIN: 6.9 g/dL (ref 6.5–8.1)

## 2016-01-31 LAB — SURGICAL PCR SCREEN
MRSA, PCR: NEGATIVE
Staphylococcus aureus: NEGATIVE

## 2016-01-31 NOTE — Pre-Procedure Instructions (Addendum)
Jason Robles  01/31/2016      [facility] [id number] - [place], [address] AT [facility] [address] [place] [address] Phone: [phone] Fax: [phone]  [id number] - [place], [place] - [address] [place] [address] Phone: [phone] Fax: [phone]    Your procedure is scheduled on Monday, February 06, 2016   Report to [place] at [place].M.             (posted surgery time 7:30 - 8:45 am)   Call this number if you have problems the MORNING of surgery:  [phone].  Short Stay is NOT open on weekends.   Remember:  Do not eat food or drink liquids after midnight Sunday.   Take these medicines the morning of surgery with A SIP OF WATER : Tylenol    STOP TODAY taking any Aspirin, Aleve, Naproxen, Ibuprofen, Motrin, Advil, Goody's, BC's, all herbal medications, fish oil, and all vitamins/supplements,meloxicam               Do not wear jewelry - no rings or watches  Do not wear lotions, powders, cologne, or deodorant.  Men may shave face and neck.   Do not bring valuables to the hospital.  [facility] is not responsible for any belongings or valuables.  Contacts, dentures or bridgework may not be worn into surgery.  Leave your suitcase in the car.  After surgery it may be brought to your room. For patients admitted to the hospital, discharge time will be determined by your treatment team.                                                               Preparing For Surgery  Before surgery, you can play an important role. Because skin is not sterile, your skin needs to be as free of germs as possible. You can reduce the number of germs on your skin by washing with CHG (chlorahexidine gluconate) Soap before surgery.  CHG is an antiseptic cleaner which kills germs and bonds with the skin to continue killing germs even after washing.  Please do not use if  you have an allergy to CHG or antibacterial soaps. If your skin becomes reddened/irritated stop using the CHG.  Do not shave (including legs and underarms) for at least 48 hours prior to first CHG shower. It is OK to shave your face.  Please follow these instructions carefully.   1. Shower the NIGHT BEFORE SURGERY and the MORNING OF SURGERY with CHG.   2. If you chose to wash your hair, wash your hair first as usual with your normal shampoo.  3. After you shampoo, rinse your hair and body thoroughly to remove the shampoo.  4. Use CHG as you would any other liquid soap. You can apply CHG directly to the skin and wash gently with a scrungie or a clean washcloth.   5. Apply the CHG Soap to your body ONLY FROM THE NECK DOWN.  Do not use on open wounds or open sores. Avoid contact with your eyes, ears, mouth and genitals (private parts). Wash genitals (private parts) with your normal soap.  6. Wash thoroughly, paying special attention to  the area where your surgery will be performed.  7. Thoroughly rinse your body with warm water from the neck down.  8. DO NOT shower/wash with your normal soap after using and rinsing off the CHG Soap.  9. Pat yourself dry with a CLEAN TOWEL.   10. Wear CLEAN PAJAMAS   11. Place CLEAN SHEETS on your bed the night of your first shower and DO NOT SLEEP WITH PETS.    Day of Surgery: Do not apply any deodorants/lotions. Please wear clean clothes to the hospital/surgery center.      Please read over the following fact sheets that you were given. Pain Booklet, MRSA Information and Surgical Site Infection Prevention

## 2016-02-03 MED ORDER — CEFAZOLIN SODIUM-DEXTROSE 2-4 GM/100ML-% IV SOLN
2.0000 g | INTRAVENOUS | Status: AC
  Administered 2016-02-06: 2 g via INTRAVENOUS
  Filled 2016-02-03: qty 100

## 2016-02-03 MED ORDER — DEXAMETHASONE SODIUM PHOSPHATE 10 MG/ML IJ SOLN
8.0000 mg | Freq: Once | INTRAMUSCULAR | Status: AC
  Administered 2016-02-06: 10 mg via INTRAVENOUS
  Filled 2016-02-03: qty 1

## 2016-02-03 MED ORDER — TRANEXAMIC ACID 1000 MG/10ML IV SOLN
1000.0000 mg | INTRAVENOUS | Status: AC
  Administered 2016-02-06: 1000 mg via INTRAVENOUS
  Filled 2016-02-03: qty 10

## 2016-02-03 MED ORDER — GABAPENTIN 300 MG PO CAPS
300.0000 mg | ORAL_CAPSULE | Freq: Once | ORAL | Status: AC
  Administered 2016-02-06: 300 mg via ORAL
  Filled 2016-02-03: qty 1

## 2016-02-03 MED ORDER — SODIUM CHLORIDE 0.9 % IV SOLN: INTRAVENOUS | Status: DC

## 2016-02-03 MED ORDER — ACETAMINOPHEN 500 MG PO TABS
1000.0000 mg | ORAL_TABLET | Freq: Once | ORAL | Status: AC
  Administered 2016-02-06: 1000 mg via ORAL
  Filled 2016-02-03: qty 2

## 2016-02-03 MED ORDER — BUPIVACAINE LIPOSOME 1.3 % IJ SUSP
20.0000 mL | INTRAMUSCULAR | Status: AC
  Administered 2016-02-06: 20 mL
  Filled 2016-02-03: qty 20

## 2016-02-05 NOTE — H&P (Signed)
Jason Robles MRN:  [id number] DOB/SEX:  May 08, 1945/male  CHIEF COMPLAINT:  Painful left Knee  HISTORY: Patient is a 71 y.o. male presented with a history of pain in the left knee. Onset of symptoms was gradual starting a few years ago with gradually worsening course since that time. Patient has been treated conservatively with over-the-counter NSAIDs and activity modification. Patient currently rates pain in the knee at 10 out of 10 with activity. There is pain at night.  PAST MEDICAL HISTORY: Patient Active Problem List   Diagnosis Date Noted  . Ganglion cyst of wrist, right 11/11/2015  . Paresthesias 11/04/2015  . Allergic conjunctivitis 08/16/2015  . Insomnia 08/16/2015  . Abscess of left hand 08/18/2012  . Medial epicondylitis of left elbow 06/25/2012  . Right knee pain 06/25/2012  . Syncope 05/02/2012  . Decreased muscle strength 03/26/2012  . TIA (transient ischemic attack) 06/28/2010  . Chest pain 06/28/2010  . Anxiety 06/28/2010  . Encounter for well adult exam with abnormal findings 06/28/2010  . Headache(784.0) 09/20/2009  . NONSPECIFIC ABN FINDING RAD & OTH EXAM GU ORGAN 05/03/2008  . PERIPHERAL VASCULAR DISEASE 08/25/2007  . ALLERGIC RHINITIS 08/25/2007  . EXTERNAL HEMORRHOIDS 04/18/2007  . Diverticulosis of colon (without mention of hemorrhage) 04/18/2007  . COLONIC POLYPS, HX OF 04/18/2007  . Hyperlipidemia 08/21/2006  . HEARING LOSS, 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  . Raynaud's syndrome 08/21/2006  . BENIGN PROSTATIC HYPERTROPHY 08/21/2006  . DEGENERATIVE JOINT DISEASE, KNEES, 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   Past Medical History:  Diagnosis Date  . ABDOMINAL PAIN OTHER SPECIFIED SITE 04/30/2008  . ALLERGIC RHINITIS 08/25/2007  . Anxiety 06/28/2010  . BENIGN PROSTATIC HYPERTROPHY 08/21/2006  . Chest pain 06/28/2010  . COLONIC POLYPS, HX OF 04/18/2007  . Complication of anesthesia   . 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 Diverticulosis of colon (without  mention of hemorrhage) 04/18/2007  . EXTERNAL HEMORRHOIDS 04/18/2007  . Headache(784.0) 09/20/2009   hx migraines  . HEARING LOSS, 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  . HYPERLIPIDEMIA 08/21/2006  . INSOMNIA-SLEEP DISORDER-UNSPEC 09/20/2009  . NONSPECIFIC ABN FINDING RAD & OTH EXAM GU ORGAN 05/03/2008  . PERIPHERAL VASCULAR DISEASE 08/25/2007  . PONV (postoperative nausea and vomiting)   . Raynaud's syndrome 08/21/2006   Past Surgical History:  Procedure Laterality Date  . APPENDECTOMY    . s/p knee surgery Bilateral    x's 4 since 1980  . s/p left shoulder  2002   rotator cuff  . s/p lumbar disc surgery  2007     MEDICATIONS:   No prescriptions prior to admission.    ALLERGIES:   Allergies  Allergen Reactions  . Iohexol Itching    Itching post 125cc Omni injection. Treated w/ 50 mg PO benedryl.  Onset Date: [id number]     REVIEW OF SYSTEMS:  A comprehensive review of systems was negative except for: Musculoskeletal: positive for arthralgias and bone pain   FAMILY HISTORY:   Family History  Problem Relation Age of Onset  . Diabetes Mother   . Alzheimer's disease Mother   . Kidney cancer Brother   . Diabetes Maternal Uncle     SOCIAL HISTORY:   Social History  Substance Use Topics  . Smoking status: Former Smoker    Packs/day: 1.00    Years: 15.00    Types: Cigarettes    Quit date: 01/31/1980  . Smokeless tobacco: Never Used  . Alcohol use 1.8 oz/week    3 Cans of beer per week     EXAMINATION:  Vital signs  in last 24 hours:    There were no vitals taken for this visit.  General Appearance:    Alert, cooperative, no distress, appears stated age  Head:    Normocephalic, without obvious abnormality, atraumatic  Eyes:    PERRL, conjunctiva/corneas clear, EOM's intact, fundi    benign, both eyes       Ears:    Normal TM's and external ear canals, both ears  Nose:   Nares normal, septum midline, mucosa normal, no drainage    or sinus tenderness  Throat:   Lips, mucosa, and  tongue normal; teeth and gums normal  Neck:   Supple, symmetrical, trachea midline, no adenopathy;       thyroid:  No enlargement/tenderness/nodules; no carotid   bruit or JVD  Back:     Symmetric, no curvature, ROM normal, no CVA tenderness  Lungs:     Clear to auscultation bilaterally, respirations unlabored  Chest wall:    No tenderness or deformity  Heart:    Regular rate and rhythm, S1 and S2 normal, no murmur, rub   or gallop  Abdomen:     Soft, non-tender, bowel sounds active all four quadrants,    no masses, no organomegaly  Genitalia:    Normal male without lesion, discharge or tenderness  Rectal:    Normal tone, normal prostate, no masses or tenderness;   guaiac negative stool  Extremities:   Extremities normal, atraumatic, no cyanosis or edema  Pulses:   2+ and symmetric all extremities  Skin:   Skin color, texture, turgor normal, no rashes or lesions  Lymph nodes:   Cervical, supraclavicular, and axillary nodes normal  Neurologic:   CNII-XII intact. Normal strength, sensation and reflexes      throughout    Musculoskeletal:  ROM 0-120, Ligaments intact,  Imaging Review Plain radiographs demonstrate severe degenerative joint disease of the left knee. The overall alignment is neutral. The bone quality appears to be excellent for age and reported activity level.  Assessment/Plan: Primary osteoarthritis, left knee   The patient history, physical examination and imaging studies are consistent with advanced degenerative joint disease of the left knee. The patient has failed conservative treatment.  The clearance notes were reviewed.  After discussion with the patient it was felt that Total Knee Replacement was indicated. The procedure,  risks, and benefits of total knee arthroplasty were presented and reviewed. The risks including but not limited to aseptic loosening, infection, blood clots, vascular injury, stiffness, patella tracking problems complications among others were  discussed. The patient acknowledged the explanation, agreed to proceed with the plan.  Donia Ast 02/05/2016, 10:54 PM

## 2016-02-05 NOTE — Anesthesia Preprocedure Evaluation (Addendum)
Anesthesia Evaluation  Patient identified by MRN, date of birth, ID band Patient awake    Reviewed: Allergy & Precautions, NPO status , Patient's Chart, lab work & pertinent test results  History of Anesthesia Complications (+) PONV and history of anesthetic complications  Airway Mallampati: II  TM Distance: >3 FB Neck ROM: Full    Dental  (+) Dental Advisory Given, Chipped   Pulmonary neg pulmonary ROS, former smoker,    Pulmonary exam normal breath sounds clear to auscultation       Cardiovascular + Peripheral Vascular Disease  negative cardio ROS Normal cardiovascular exam Rhythm:Regular Rate:Normal     Neuro/Psych  Headaches, PSYCHIATRIC DISORDERS Anxiety TIAnegative neurological ROS  negative psych ROS   GI/Hepatic negative GI ROS, Neg liver ROS,   Endo/Other  negative endocrine ROS  Renal/GU negative Renal ROS  negative genitourinary   Musculoskeletal negative musculoskeletal ROS (+) Arthritis , Osteoarthritis,    Abdominal   Peds negative pediatric ROS (+)  Hematology negative hematology ROS (+)   Anesthesia Other Findings   Reproductive/Obstetrics negative OB ROS                            Anesthesia Physical Anesthesia Plan  ASA: II  Anesthesia Plan: Spinal and Regional   Post-op Pain Management:  Regional for Post-op pain   Induction:   Airway Management Planned:   Additional Equipment:   Intra-op Plan:   Post-operative Plan:   Informed Consent: I have reviewed the patients History and Physical, chart, labs and discussed the procedure including the risks, benefits and alternatives for the proposed anesthesia with the patient or authorized representative who has indicated his/her understanding and acceptance.   Dental advisory given  Plan Discussed with: CRNA  Anesthesia Plan Comments:         Anesthesia Quick Evaluation

## 2016-02-06 ENCOUNTER — Inpatient Hospital Stay (HOSPITAL_COMMUNITY): Payer: Medicare HMO | Admitting: Anesthesiology

## 2016-02-06 ENCOUNTER — Observation Stay (HOSPITAL_COMMUNITY)
Admission: RE | Admit: 2016-02-06 | Discharge: 2016-02-07 | Disposition: A | Discharge status: Home / Self Care | Payer: Medicare HMO | Source: Ambulatory Visit | Attending: Orthopedic Surgery | Admitting: Orthopedic Surgery

## 2016-02-06 ENCOUNTER — Encounter (HOSPITAL_COMMUNITY)
Admission: RE | Disposition: A | Discharge status: Home / Self Care | Payer: Self-pay | Source: Ambulatory Visit | Attending: Orthopedic Surgery

## 2016-02-06 ENCOUNTER — Encounter (HOSPITAL_COMMUNITY): Payer: Self-pay | Admitting: Surgery

## 2016-02-06 DIAGNOSIS — E785 Hyperlipidemia, unspecified: Secondary | ICD-10-CM | POA: Insufficient documentation

## 2016-02-06 DIAGNOSIS — Z8673 Personal history of transient ischemic attack (TIA), and cerebral infarction without residual deficits: Secondary | ICD-10-CM | POA: Insufficient documentation

## 2016-02-06 DIAGNOSIS — G8918 Other acute postprocedural pain: Secondary | ICD-10-CM | POA: Diagnosis not present

## 2016-02-06 DIAGNOSIS — Z96659 Presence of unspecified artificial knee joint: Secondary | ICD-10-CM

## 2016-02-06 DIAGNOSIS — Z87891 Personal history of nicotine dependence: Secondary | ICD-10-CM | POA: Diagnosis not present

## 2016-02-06 DIAGNOSIS — M1712 Unilateral primary osteoarthritis, left knee: Principal | ICD-10-CM | POA: Insufficient documentation

## 2016-02-06 DIAGNOSIS — I7300 Raynaud's syndrome without gangrene: Secondary | ICD-10-CM | POA: Insufficient documentation

## 2016-02-06 DIAGNOSIS — I739 Peripheral vascular disease, unspecified: Secondary | ICD-10-CM | POA: Insufficient documentation

## 2016-02-06 DIAGNOSIS — M25561 Pain in right knee: Secondary | ICD-10-CM | POA: Diagnosis not present

## 2016-02-06 DIAGNOSIS — Z888 Allergy status to other drugs, medicaments and biological substances status: Secondary | ICD-10-CM | POA: Insufficient documentation

## 2016-02-06 DIAGNOSIS — R69 Illness, unspecified: Secondary | ICD-10-CM | POA: Diagnosis not present

## 2016-02-06 DIAGNOSIS — F419 Anxiety disorder, unspecified: Secondary | ICD-10-CM | POA: Insufficient documentation

## 2016-02-06 DIAGNOSIS — Z7982 Long term (current) use of aspirin: Secondary | ICD-10-CM | POA: Diagnosis not present

## 2016-02-06 DIAGNOSIS — R262 Difficulty in walking, not elsewhere classified: Secondary | ICD-10-CM

## 2016-02-06 DIAGNOSIS — G4700 Insomnia, unspecified: Secondary | ICD-10-CM | POA: Diagnosis not present

## 2016-02-06 HISTORY — DX: Presence of unspecified artificial knee joint: Z96.659

## 2016-02-06 HISTORY — PX: TOTAL KNEE ARTHROPLASTY: SHX125

## 2016-02-06 SURGERY — ARTHROPLASTY, KNEE, TOTAL
Anesthesia: Monitor Anesthesia Care | Laterality: Left

## 2016-02-06 MED ORDER — FENTANYL CITRATE (PF) 100 MCG/2ML IJ SOLN
INTRAMUSCULAR | Status: AC
  Filled 2016-02-06: qty 2

## 2016-02-06 MED ORDER — PROPOFOL 500 MG/50ML IV EMUL
INTRAVENOUS | Status: AC
  Filled 2016-02-06: qty 200

## 2016-02-06 MED ORDER — ZOLPIDEM TARTRATE 5 MG PO TABS
5.0000 mg | ORAL_TABLET | Freq: Every evening | ORAL | Status: DC | PRN
  Administered 2016-02-06: 5 mg via ORAL
  Filled 2016-02-06: qty 1

## 2016-02-06 MED ORDER — PROMETHAZINE HCL 25 MG/ML IJ SOLN: 6.2500 mg | INTRAMUSCULAR | Status: DC | PRN

## 2016-02-06 MED ORDER — METHOCARBAMOL 1000 MG/10ML IJ SOLN
500.0000 mg | Freq: Four times a day (QID) | INTRAVENOUS | Status: DC | PRN
  Filled 2016-02-06: qty 5

## 2016-02-06 MED ORDER — ATORVASTATIN CALCIUM 10 MG PO TABS
10.0000 mg | ORAL_TABLET | Freq: Every day | ORAL | Status: DC
  Administered 2016-02-06 – 2016-02-07 (×3): 10 mg via ORAL
  Filled 2016-02-06 (×3): qty 1

## 2016-02-06 MED ORDER — HYDROCODONE-ACETAMINOPHEN 7.5-325 MG PO TABS
1.0000 | ORAL_TABLET | Freq: Four times a day (QID) | ORAL | Status: DC
  Administered 2016-02-06 – 2016-02-07 (×5): 1 via ORAL
  Filled 2016-02-06 (×5): qty 1

## 2016-02-06 MED ORDER — CELECOXIB 200 MG PO CAPS
200.0000 mg | ORAL_CAPSULE | Freq: Two times a day (BID) | ORAL | Status: DC
  Administered 2016-02-06 – 2016-02-07 (×2): 200 mg via ORAL
  Filled 2016-02-06 (×2): qty 1

## 2016-02-06 MED ORDER — DIPHENHYDRAMINE HCL 12.5 MG/5ML PO ELIX: 12.5000 mg | ORAL_SOLUTION | ORAL | Status: DC | PRN

## 2016-02-06 MED ORDER — METHOCARBAMOL 500 MG PO TABS: 500.0000 mg | ORAL_TABLET | Freq: Four times a day (QID) | ORAL | Status: DC | PRN

## 2016-02-06 MED ORDER — PROPOFOL 10 MG/ML IV BOLUS
INTRAVENOUS | Status: DC | PRN
  Administered 2016-02-06: 20 mg via INTRAVENOUS

## 2016-02-06 MED ORDER — PROPOFOL 10 MG/ML IV BOLUS
INTRAVENOUS | Status: AC
  Filled 2016-02-06: qty 40

## 2016-02-06 MED ORDER — SODIUM CHLORIDE 0.9 % IR SOLN
Status: DC | PRN
  Administered 2016-02-06: 3000 mL

## 2016-02-06 MED ORDER — HYDROMORPHONE HCL 1 MG/ML IJ SOLN
INTRAMUSCULAR | Status: AC
  Filled 2016-02-06: qty 0.5

## 2016-02-06 MED ORDER — GABAPENTIN 300 MG PO CAPS
300.0000 mg | ORAL_CAPSULE | Freq: Three times a day (TID) | ORAL | Status: DC
  Administered 2016-02-06 – 2016-02-07 (×3): 300 mg via ORAL
  Filled 2016-02-06 (×3): qty 1

## 2016-02-06 MED ORDER — CHLORHEXIDINE GLUCONATE 4 % EX LIQD: 60.0000 mL | Freq: Once | CUTANEOUS | Status: DC

## 2016-02-06 MED ORDER — ACETAMINOPHEN 650 MG RE SUPP: 650.0000 mg | Freq: Four times a day (QID) | RECTAL | Status: DC | PRN

## 2016-02-06 MED ORDER — DEXAMETHASONE SODIUM PHOSPHATE 10 MG/ML IJ SOLN
10.0000 mg | Freq: Once | INTRAMUSCULAR | Status: AC
  Administered 2016-02-07: 10 mg via INTRAVENOUS
  Filled 2016-02-06: qty 1

## 2016-02-06 MED ORDER — PROPOFOL 1000 MG/100ML IV EMUL
INTRAVENOUS | Status: AC
  Filled 2016-02-06: qty 400

## 2016-02-06 MED ORDER — DOCUSATE SODIUM 100 MG PO CAPS
100.0000 mg | ORAL_CAPSULE | Freq: Two times a day (BID) | ORAL | Status: DC
  Administered 2016-02-06 – 2016-02-07 (×3): 100 mg via ORAL
  Filled 2016-02-06 (×3): qty 1

## 2016-02-06 MED ORDER — MIDAZOLAM HCL 5 MG/5ML IJ SOLN
INTRAMUSCULAR | Status: DC | PRN
  Administered 2016-02-06 (×2): 1 mg via INTRAVENOUS

## 2016-02-06 MED ORDER — HYDROMORPHONE HCL 1 MG/ML IJ SOLN
0.2500 mg | INTRAMUSCULAR | Status: DC | PRN
  Administered 2016-02-06: 0.5 mg via INTRAVENOUS

## 2016-02-06 MED ORDER — BUPIVACAINE IN DEXTROSE 0.75-8.25 % IT SOLN
INTRATHECAL | Status: DC | PRN
  Administered 2016-02-06: 2 mL via INTRATHECAL

## 2016-02-06 MED ORDER — PHENOL 1.4 % MT LIQD
1.0000 | OROMUCOSAL | Status: DC | PRN
Start: 2016-02-06 — End: 2016-02-07

## 2016-02-06 MED ORDER — CEFAZOLIN IN D5W 1 GM/50ML IV SOLN
1.0000 g | Freq: Four times a day (QID) | INTRAVENOUS | Status: AC
  Administered 2016-02-06 (×2): 1 g via INTRAVENOUS
  Filled 2016-02-06 (×3): qty 50

## 2016-02-06 MED ORDER — ALUM & MAG HYDROXIDE-SIMETH 200-200-20 MG/5ML PO SUSP: 30.0000 mL | ORAL | Status: DC | PRN

## 2016-02-06 MED ORDER — ONDANSETRON HCL 4 MG/2ML IJ SOLN: 4.0000 mg | Freq: Four times a day (QID) | INTRAMUSCULAR | Status: DC | PRN

## 2016-02-06 MED ORDER — BISACODYL 5 MG PO TBEC: 5.0000 mg | DELAYED_RELEASE_TABLET | Freq: Every day | ORAL | Status: DC | PRN

## 2016-02-06 MED ORDER — OXYCODONE HCL 5 MG PO TABS
ORAL_TABLET | ORAL | Status: AC
  Filled 2016-02-06: qty 2

## 2016-02-06 MED ORDER — ONDANSETRON HCL 4 MG PO TABS: 4.0000 mg | ORAL_TABLET | Freq: Four times a day (QID) | ORAL | Status: DC | PRN

## 2016-02-06 MED ORDER — METOCLOPRAMIDE HCL 5 MG PO TABS: 5.0000 mg | ORAL_TABLET | Freq: Three times a day (TID) | ORAL | Status: DC | PRN

## 2016-02-06 MED ORDER — MIDAZOLAM HCL 2 MG/2ML IJ SOLN
INTRAMUSCULAR | Status: AC
  Filled 2016-02-06: qty 2

## 2016-02-06 MED ORDER — TRANEXAMIC ACID 1000 MG/10ML IV SOLN
1000.0000 mg | Freq: Once | INTRAVENOUS | Status: AC
  Administered 2016-02-06: 1000 mg via INTRAVENOUS
  Filled 2016-02-06: qty 10

## 2016-02-06 MED ORDER — BUPIVACAINE-EPINEPHRINE (PF) 0.5% -1:200000 IJ SOLN: INTRAMUSCULAR | Status: DC | PRN

## 2016-02-06 MED ORDER — HYDROMORPHONE HCL 2 MG/ML IJ SOLN: 1.0000 mg | INTRAMUSCULAR | Status: DC | PRN

## 2016-02-06 MED ORDER — FENTANYL CITRATE (PF) 100 MCG/2ML IJ SOLN
INTRAMUSCULAR | Status: DC | PRN
  Administered 2016-02-06 (×2): 50 ug via INTRAVENOUS

## 2016-02-06 MED ORDER — ROPIVACAINE HCL 7.5 MG/ML IJ SOLN
INTRAMUSCULAR | Status: DC | PRN
  Administered 2016-02-06: 20 mL via PERINEURAL

## 2016-02-06 MED ORDER — METOCLOPRAMIDE HCL 5 MG/ML IJ SOLN: 5.0000 mg | Freq: Three times a day (TID) | INTRAMUSCULAR | Status: DC | PRN

## 2016-02-06 MED ORDER — FLEET ENEMA 7-19 GM/118ML RE ENEM: 1.0000 | ENEMA | Freq: Once | RECTAL | Status: DC | PRN

## 2016-02-06 MED ORDER — ACETAMINOPHEN 325 MG PO TABS: 650.0000 mg | ORAL_TABLET | Freq: Four times a day (QID) | ORAL | Status: DC | PRN

## 2016-02-06 MED ORDER — ONDANSETRON HCL 4 MG/2ML IJ SOLN
INTRAMUSCULAR | Status: DC | PRN
Start: 2016-02-06 — End: 2016-02-06
  Administered 2016-02-06: 4 mg via INTRAVENOUS

## 2016-02-06 MED ORDER — ASPIRIN EC 325 MG PO TBEC
325.0000 mg | DELAYED_RELEASE_TABLET | Freq: Two times a day (BID) | ORAL | Status: DC
  Administered 2016-02-06 – 2016-02-07 (×3): 325 mg via ORAL
  Filled 2016-02-06 (×3): qty 1

## 2016-02-06 MED ORDER — MENTHOL 3 MG MT LOZG: 1.0000 | LOZENGE | OROMUCOSAL | Status: DC | PRN

## 2016-02-06 MED ORDER — SENNOSIDES-DOCUSATE SODIUM 8.6-50 MG PO TABS: 1.0000 | ORAL_TABLET | Freq: Every evening | ORAL | Status: DC | PRN

## 2016-02-06 MED ORDER — LACTATED RINGERS IV SOLN
INTRAVENOUS | Status: DC | PRN
  Administered 2016-02-06: 07:00:00 via INTRAVENOUS

## 2016-02-06 MED ORDER — SODIUM CHLORIDE 0.9 % IV SOLN
INTRAVENOUS | Status: DC
  Administered 2016-02-07: 75 mL/h via INTRAVENOUS

## 2016-02-06 MED ORDER — OXYCODONE HCL 5 MG PO TABS
5.0000 mg | ORAL_TABLET | ORAL | Status: DC | PRN
  Administered 2016-02-06: 10 mg via ORAL

## 2016-02-06 MED ORDER — BUPIVACAINE HCL (PF) 0.25 % IJ SOLN
INTRAMUSCULAR | Status: AC
  Filled 2016-02-06: qty 30

## 2016-02-06 MED ORDER — EPINEPHRINE PF 1 MG/ML IJ SOLN
INTRAMUSCULAR | Status: AC
  Filled 2016-02-06: qty 1

## 2016-02-06 MED ORDER — MEPERIDINE HCL 25 MG/ML IJ SOLN: 6.2500 mg | INTRAMUSCULAR | Status: DC | PRN

## 2016-02-06 MED ORDER — PROPOFOL 500 MG/50ML IV EMUL
INTRAVENOUS | Status: DC | PRN
  Administered 2016-02-06: 35 ug/kg/min via INTRAVENOUS

## 2016-02-06 SURGICAL SUPPLY — 61 items
BANDAGE ACE 6X5 VEL STRL LF (GAUZE/BANDAGES/DRESSINGS) ×2 IMPLANT
BANDAGE ELASTIC 6 VELCRO ST LF (GAUZE/BANDAGES/DRESSINGS) ×1 IMPLANT
BANDAGE ESMARK 6X9 LF (GAUZE/BANDAGES/DRESSINGS) ×1 IMPLANT
BLADE SAGITTAL 13X1.27X60 (BLADE) ×2 IMPLANT
BLADE SAW SGTL 83.5X18.5 (BLADE) ×2 IMPLANT
BLADE SURG 10 STRL SS (BLADE) ×2 IMPLANT
BNDG CMPR 9X6 STRL LF SNTH (GAUZE/BANDAGES/DRESSINGS) ×1
BNDG ESMARK 6X9 LF (GAUZE/BANDAGES/DRESSINGS) ×2
BOWL SMART MIX CTS (DISPOSABLE) ×2 IMPLANT
CAPT KNEE TOTAL 3 ×2 IMPLANT
CEMENT BONE SIMPLEX SPEEDSET (Cement) ×4 IMPLANT
COVER SURGICAL LIGHT HANDLE (MISCELLANEOUS) ×2 IMPLANT
CUFF TOURNIQUET SINGLE 34IN LL (TOURNIQUET CUFF) ×2 IMPLANT
DRAPE EXTREMITY T 121X128X90 (DRAPE) ×2 IMPLANT
DRAPE HALF SHEET 40X57 (DRAPES) ×2 IMPLANT
DRAPE INCISE IOBAN 66X45 STRL (DRAPES) ×4 IMPLANT
DRAPE U-SHAPE 47X51 STRL (DRAPES) ×2 IMPLANT
DRSG AQUACEL AG ADV 3.5X10 (GAUZE/BANDAGES/DRESSINGS) ×2 IMPLANT
DURAPREP 26ML APPLICATOR (WOUND CARE) ×4 IMPLANT
ELECT REM PT RETURN 9FT ADLT (ELECTROSURGICAL) ×2
ELECTRODE REM PT RTRN 9FT ADLT (ELECTROSURGICAL) ×1 IMPLANT
FILTER STRAW FLUID ASPIR (MISCELLANEOUS) IMPLANT
GLOVE BIOGEL M 7.0 STRL (GLOVE) IMPLANT
GLOVE BIOGEL PI IND STRL 7.5 (GLOVE) IMPLANT
GLOVE BIOGEL PI IND STRL 8.5 (GLOVE) ×1 IMPLANT
GLOVE BIOGEL PI INDICATOR 7.5 (GLOVE)
GLOVE BIOGEL PI INDICATOR 8.5 (GLOVE) ×1
GLOVE SURG ORTHO 8.0 STRL STRW (GLOVE) ×4 IMPLANT
GOWN STRL REUS W/ TWL LRG LVL3 (GOWN DISPOSABLE) ×1 IMPLANT
GOWN STRL REUS W/ TWL XL LVL3 (GOWN DISPOSABLE) ×2 IMPLANT
GOWN STRL REUS W/TWL 2XL LVL3 (GOWN DISPOSABLE) ×2 IMPLANT
GOWN STRL REUS W/TWL LRG LVL3 (GOWN DISPOSABLE) ×2
GOWN STRL REUS W/TWL XL LVL3 (GOWN DISPOSABLE) ×4
HANDPIECE INTERPULSE COAX TIP (DISPOSABLE) ×2
HOOD PEEL AWAY FACE SHEILD DIS (HOOD) ×6 IMPLANT
KIT BASIN OR (CUSTOM PROCEDURE TRAY) ×2 IMPLANT
KIT ROOM TURNOVER OR (KITS) ×2 IMPLANT
KNEE CAPITATED TOTAL 3 IMPLANT
MANIFOLD NEPTUNE II (INSTRUMENTS) ×2 IMPLANT
NDL 18GX1X1/2 (RX/OR ONLY) (NEEDLE) IMPLANT
NEEDLE 18GX1X1/2 (RX/OR ONLY) (NEEDLE) IMPLANT
NEEDLE 22X1 1/2 (OR ONLY) (NEEDLE) ×4 IMPLANT
NS IRRIG 1000ML POUR BTL (IV SOLUTION) ×2 IMPLANT
PACK TOTAL JOINT (CUSTOM PROCEDURE TRAY) ×2 IMPLANT
PAD ARMBOARD 7.5X6 YLW CONV (MISCELLANEOUS) ×4 IMPLANT
SET HNDPC FAN SPRY TIP SCT (DISPOSABLE) ×1 IMPLANT
STRIP CLOSURE SKIN 1/2X4 (GAUZE/BANDAGES/DRESSINGS) ×2 IMPLANT
SUCTION FRAZIER HANDLE 10FR (MISCELLANEOUS)
SUCTION TUBE FRAZIER 10FR DISP (MISCELLANEOUS) IMPLANT
SUT MNCRL AB 3-0 PS2 18 (SUTURE) ×2 IMPLANT
SUT VIC AB 0 CTB1 27 (SUTURE) ×4 IMPLANT
SUT VIC AB 1 CT1 27 (SUTURE) ×6
SUT VIC AB 1 CT1 27XBRD ANBCTR (SUTURE) ×2 IMPLANT
SUT VIC AB 2-0 CT1 27 (SUTURE) ×4
SUT VIC AB 2-0 CT1 TAPERPNT 27 (SUTURE) ×2 IMPLANT
SYR 20CC LL (SYRINGE) ×4 IMPLANT
SYR TB 1ML LUER SLIP (SYRINGE) IMPLANT
TOWEL OR 17X24 6PK STRL BLUE (TOWEL DISPOSABLE) ×2 IMPLANT
TOWEL OR 17X26 10 PK STRL BLUE (TOWEL DISPOSABLE) ×2 IMPLANT
TRAY CATH 16FR W/PLASTIC CATH (SET/KITS/TRAYS/PACK) IMPLANT
WRAP KNEE MAXI GEL POST OP (GAUZE/BANDAGES/DRESSINGS) ×2 IMPLANT

## 2016-02-06 NOTE — Anesthesia Procedure Notes (Signed)
Spinal  Patient location during procedure: OR Start time: 02/06/2016 7:40 AM End time: 02/06/2016 7:43 AM Staffing Anesthesiologist: Nolon Nations Performed: anesthesiologist  Preanesthetic Checklist Completed: patient identified, site marked, surgical consent, pre-op evaluation, timeout performed, IV checked, risks and benefits discussed and monitors and equipment checked Spinal Block Patient position: sitting Prep: ChloraPrep Patient monitoring: heart rate, continuous pulse ox and blood pressure Location: L3-4 Injection technique: single-shot Needle Needle type: Sprotte  Needle gauge: 24 G Needle length: 9 cm Additional Notes Expiration date of kit checked and confirmed. Patient tolerated procedure well, without complications.

## 2016-02-06 NOTE — Anesthesia Procedure Notes (Addendum)
Anesthesia Regional Block:  Adductor canal block  Pre-Anesthetic Checklist: ,, timeout performed, Correct Patient, Correct Site, Correct Laterality, Correct Procedure, Correct Position, site marked, Risks and benefits discussed,  Surgical consent,  Pre-op evaluation,  At surgeon's request and post-op pain management  Laterality: Left  Prep: chloraprep       Needles:  Injection technique: Single-shot  Needle Type: Stimiplex     Needle Length: 9cm 9 cm Needle Gauge: 21 and 21 G    Additional Needles:  Procedures: ultrasound guided (picture in chart) Adductor canal block Narrative:  Start time: 02/06/2016 7:10 AM End time: 02/06/2016 7:15 AM Injection made incrementally with aspirations every 5 mL.  Performed by: Personally  Anesthesiologist: Nolon Nations  Additional Notes: BP cuff, EKG monitors applied. Sedation begun. Artery and nerve location verified with U/S and anesthetic injected incrementally, slowly, and after negative aspirations under direct u/s guidance. Good fascial /perineural spread. Tolerated well.

## 2016-02-06 NOTE — Progress Notes (Signed)
Orthopedic Tech Progress Note Patient Details:  Jason Robles 24-Dec-1945 [id number]  CPM Left Knee CPM Left Knee: On Left Knee Flexion (Degrees): 90 Left Knee Extension (Degrees): 0 Additional Comments: foot roll   [name] 02/06/2016, 10:48 AM

## 2016-02-06 NOTE — Anesthesia Postprocedure Evaluation (Signed)
Anesthesia Post Note  Patient: Jason Robles  Procedure(s) Performed: Procedure(s) (LRB): TOTAL KNEE ARTHROPLASTY (Left)  Patient location during evaluation: PACU Anesthesia Type: Regional and Spinal Level of consciousness: awake and alert Pain management: pain level controlled Vital Signs Assessment: post-procedure vital signs reviewed and stable Respiratory status: spontaneous breathing and respiratory function stable Cardiovascular status: blood pressure returned to baseline and stable Postop Assessment: spinal receding Anesthetic complications: no       Last Vitals:  Vitals:   02/06/16 1030 02/06/16 1045  BP: 135/78 122/74  Pulse: (!) 58 (!) 54  Resp: 19 14  Temp:      Last Pain:  Vitals:   02/06/16 1045  PainSc: 7                  Guyla Bless [facility]

## 2016-02-06 NOTE — Transfer of Care (Signed)
Immediate Anesthesia Transfer of Care Note  Patient: Jason Robles  Procedure(s) Performed: Procedure(s): TOTAL KNEE ARTHROPLASTY (Left)  Patient Location: PACU  Anesthesia Type:MAC, Regional and Spinal  Level of Consciousness: awake, alert , oriented and sedated  Airway & Oxygen Therapy: Patient Spontanous Breathing and Patient connected to nasal cannula oxygen  Post-op Assessment: Report given to RN, Post -op Vital signs reviewed and stable and Patient moving all extremities  Post vital signs: Reviewed and stable  Last Vitals:  Vitals:   02/06/16 0942 02/06/16 0945  BP: 104/68   Pulse: 63 63  Resp: (P) 12 11  Temp: (P) 36.1 C     Last Pain: There were no vitals filed for this visit.    Patients Stated Pain Goal: 7 (00/76/22 6333)  Complications: No apparent anesthesia complications

## 2016-02-06 NOTE — Transfer of Care (Signed)
Immediate Anesthesia Transfer of Care Note  Patient: Jason Robles  Procedure(s) Performed: Procedure(s): TOTAL KNEE ARTHROPLASTY (Left)  Patient Location: PACU  Anesthesia Type:MAC, Regional and Spinal  Level of Consciousness: awake, alert , oriented and sedated  Airway & Oxygen Therapy: Patient Spontanous Breathing, Patient connected to nasal cannula oxygen and Patient connected to face mask oxygen  Post-op Assessment: Report given to RN, Post -op Vital signs reviewed and stable and Patient moving all extremities  Post vital signs: Reviewed and stable  Last Vitals:  Vitals:   02/06/16 0631  BP: 129/77  Pulse: 61  Resp: 20  Temp: 36.5 C    Last Pain: There were no vitals filed for this visit.    Patients Stated Pain Goal: 7 (62/13/08 6578)  Complications: No apparent anesthesia complications

## 2016-02-06 NOTE — Evaluation (Signed)
Physical Therapy Evaluation Patient Details Name: Jason Robles MRN: [id number] DOB: 05-13-1945 Today's Date: 02/06/2016   History of Present Illness  71 y.o. male admitted to [facility] on 02/06/16 for elective L TKA.  Pt with significant PMHx of PVD, R ear hearing loss, HA, chest pain, lumbar disc surgery, L shoulder surgery, and bil knee surgery.    Clinical Impression  Pt is POD #0 and is moving well, mod I to min guard assist overall.  He was able to walk a good distance down the hallway with RW.  He will need stair training prior to planned d/c tomorrow.   PT to follow acutely for deficits listed below.     Follow Up Recommendations Home health PT;Supervision for mobility/OOB    Equipment Recommendations  None recommended by PT    Recommendations for Other Services   NA    Precautions / Restrictions Precautions Precautions: Knee Precaution Booklet Issued: Yes (comment) Precaution Comments: knee exercise handout given and no pillow under surgical knee reviewed Restrictions Weight Bearing Restrictions: Yes LLE Weight Bearing: Weight bearing as tolerated      Mobility  Bed Mobility Overal bed mobility: Modified Independent             General bed mobility comments: Pt was able to get EOB without external assist using bed rail and lifting his own leg to EOB.    Transfers Overall transfer level: Modified independent Equipment used: Rolling walker (2 wheeled)             General transfer comment: Pt was able to stand EOB unassisted with use of his hands for stability during transitios.   Ambulation/Gait Ambulation/Gait assistance: Min guard Ambulation Distance (Feet): 120 Feet Assistive device: Rolling walker (2 wheeled) Gait Pattern/deviations: Step-through pattern;Antalgic Gait velocity: decreased Gait velocity interpretation: Below normal speed for age/gender General Gait Details: Pt with mildly antalgic gait pattern, verbal cues for heel to toe contact and push  off.           Balance Overall balance assessment: Needs assistance Sitting-balance support: Feet supported;Bilateral upper extremity supported       Standing balance support: Bilateral upper extremity supported Standing balance-Leahy Scale: Poor                               Pertinent Vitals/Pain Pain Assessment: 0-10 Pain Score: 3  Pain Location: left knee Pain Descriptors / Indicators: Aching Pain Intervention(s): Limited activity within patient's tolerance;Monitored during session;Repositioned;Ice applied    Home Living Family/patient expects to be discharged to:: Private residence Living Arrangements: Spouse/significant other Available Help at Discharge: Family;Available 24 hours/day (x 2 weeks) Type of Home: House Home Access: Stairs to enter Entrance Stairs-Rails: None Entrance Stairs-Number of Steps: 3 Home Layout: Two level Home Equipment: Environmental consultant - 2 wheels (CPM)      Prior Function Level of Independence: Independent         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)        Extremity/Trunk Assessment   Upper Extremity Assessment Upper Extremity Assessment: Defer to OT evaluation    Lower Extremity Assessment Lower Extremity Assessment: LLE deficits/detail LLE Deficits / Details: left leg with normal post op pain and weakness.  Ankle at least 4/5, knee 2+/5, hip flexion 3-/5.      Cervical / Trunk Assessment Cervical / Trunk Assessment: Normal  Communication   Communication: HOH (right ear)  Cognition Arousal/Alertness: Awake/alert Behavior During Therapy: WFL for tasks  assessed/performed Overall Cognitive Status: Within Functional Limits for tasks assessed                         Exercises Total Joint Exercises Ankle Circles/Pumps: AROM;Both;20 reps Quad Sets: AROM;Left;10 reps Towel Squeeze: PROM;Both;10 reps Heel Slides: AAROM;Left;10 reps   Assessment/Plan    PT Assessment Patient needs continued PT services   PT Problem List Decreased strength;Decreased range of motion;Decreased activity tolerance;Decreased balance;Decreased mobility;Decreased knowledge of use of DME;Decreased knowledge of precautions;Pain          PT Treatment Interventions DME instruction;Stair training;Gait training;Functional mobility training;Therapeutic activities;Therapeutic exercise;Balance training;Patient/family education    PT Goals (Current goals can be found in the Care Plan section)  Acute Rehab PT Goals Patient Stated Goal: to get back to work, have his other knee done in 6-8 weeks.  PT Goal Formulation: With patient Time For Goal Achievement: 02/13/16 Potential to Achieve Goals: Good    Frequency 7X/week           End of Session Equipment Utilized During Treatment: Gait belt Activity Tolerance: Patient limited by pain Patient left: in chair;with call bell/phone within reach      Functional Assessment Tool Used: assist level Functional Limitation: Mobility: Walking and moving around Mobility: Walking and Moving Around Current Status [id number]): At least 1 percent but less than 20 percent impaired, limited or restricted Mobility: Walking and Moving Around Goal Status [phone]): 0 percent impaired, limited or restricted    Time: 1740-1817 PT Time Calculation (min) (ACUTE ONLY): 37 min   Charges:   PT Evaluation $PT Eval Low Complexity: 1 Procedure PT Treatments $Gait Training: 8-22 mins   PT G Codes:   PT G-Codes **NOT FOR INPATIENT CLASS** Functional Assessment Tool Used: assist level Functional Limitation: Mobility: Walking and moving around Mobility: Walking and Moving Around Current Status [id number]): At least 1 percent but less than 20 percent impaired, limited or restricted Mobility: Walking and Moving Around Goal Status [phone]): 0 percent impaired, limited or restricted    Elika Godar B. Leata Dominy, PT, DPT [phone]   02/06/2016, 10:19 PM

## 2016-02-07 ENCOUNTER — Encounter (HOSPITAL_COMMUNITY): Payer: Self-pay | Admitting: Orthopedic Surgery

## 2016-02-07 DIAGNOSIS — Z96652 Presence of left artificial knee joint: Secondary | ICD-10-CM | POA: Diagnosis not present

## 2016-02-07 DIAGNOSIS — M1712 Unilateral primary osteoarthritis, left knee: Secondary | ICD-10-CM | POA: Diagnosis not present

## 2016-02-07 LAB — CBC
HEMATOCRIT: 35.6 % — AB (ref 39.0–52.0)
Hemoglobin: 11.7 g/dL — ABNORMAL LOW (ref 13.0–17.0)
MCH: 28.5 pg (ref 26.0–34.0)
MCHC: 32.9 g/dL (ref 30.0–36.0)
MCV: 86.8 fL (ref 78.0–100.0)
Platelets: 149 10*3/uL — ABNORMAL LOW (ref 150–400)
RBC: 4.1 MIL/uL — ABNORMAL LOW (ref 4.22–5.81)
RDW: 13.3 % (ref 11.5–15.5)
WBC: 10.4 10*3/uL (ref 4.0–10.5)

## 2016-02-07 LAB — BASIC METABOLIC PANEL
Anion gap: 9 (ref 5–15)
BUN: 13 mg/dL (ref 6–20)
CALCIUM: 8.9 mg/dL (ref 8.9–10.3)
CO2: 30 mmol/L (ref 22–32)
CREATININE: 0.9 mg/dL (ref 0.61–1.24)
Chloride: 99 mmol/L — ABNORMAL LOW (ref 101–111)
GFR calc non Af Amer: >60 mL/min (ref >60)
GLUCOSE: 122 mg/dL — AB (ref 65–99)
Potassium: 4.3 mmol/L (ref 3.5–5.1)
Sodium: 138 mmol/L (ref 135–145)

## 2016-02-07 MED ORDER — ASPIRIN 325 MG PO TBEC: 325.0000 mg | DELAYED_RELEASE_TABLET | Freq: Two times a day (BID) | ORAL | 0 refills | Status: DC

## 2016-02-07 MED ORDER — OXYCODONE HCL 10 MG PO TABS: 10.0000 mg | ORAL_TABLET | ORAL | 0 refills | Status: DC | PRN

## 2016-02-07 MED ORDER — METHOCARBAMOL 500 MG PO TABS: 500.0000 mg | ORAL_TABLET | Freq: Four times a day (QID) | ORAL | 0 refills | Status: DC | PRN

## 2016-02-07 NOTE — Op Note (Signed)
TOTAL KNEE REPLACEMENT OPERATIVE NOTE:  02/06/2016  1:42 PM  PATIENT:  Jason Robles  71 y.o. male  PRE-OPERATIVE DIAGNOSIS:  primary osteoarthritis left knee  POST-OPERATIVE DIAGNOSIS:  same   PROCEDURE:  Procedure(s): TOTAL KNEE ARTHROPLASTY  SURGEON:  Surgeon(s): Vickey Huger, MD  PHYSICIAN ASSISTANT: Carlyon Shadow, [facility]  ANESTHESIA:   spinal  DRAINS: Hemovac  SPECIMEN: None  COUNTS:  Correct  TOURNIQUET:  * Missing tourniquet times found for documented tourniquets in log:  [id number] *  DICTATION:  Indication for procedure:    The patient is a 71 y.o. male who has failed conservative treatment for primary osteoarthritis left knee.  Informed consent was obtained prior to anesthesia. The risks versus benefits of the operation were explain and in a way the patient can, and did, understand.   On the implant demand matching protocol, this patient scored 8.  Therefore, this patient was not receive a polyethylene insert with vitamin E which is a high demand implant.  Description of procedure:     The patient was taken to the operating room and placed under anesthesia.  The patient was positioned in the usual fashion taking care that all body parts were adequately padded and/or protected.  I foley catheter was not placed.  A tourniquet was applied and the leg prepped and draped in the usual sterile fashion.  The extremity was exsanguinated with the esmarch and tourniquet inflated to 350 mmHg.  Pre-operative range of motion was normal.  The knee was in 5 degree of mild varus.  A midline incision approximately 6-7 inches long was made with a #10 blade.  A new blade was used to make a parapatellar arthrotomy going 2-3 cm into the quadriceps tendon, over the patella, and alongside the medial aspect of the patellar tendon.  A synovectomy was then performed with the #10 blade and forceps. I then elevated the deep MCL off the medial tibial metaphysis subperiosteally around to the  semimembranosus attachment.    I everted the patella and used calipers to measure patellar thickness.  I used the reamer to ream down to appropriate thickness to recreate the native thickness.  I then removed excess bone with the rongeur and sagittal saw.  I used the appropriately sized template and drilled the three lug holes.  I then put the trial in place and measured the thickness with the calipers to ensure recreation of the native thickness.  The trial was then removed and the patella subluxed and the knee brought into flexion.  A homan retractor was place to retract and protect the patella and lateral structures.  A Z-retractor was place medially to protect the medial structures.  The extra-medullary alignment system was used to make cut the tibial articular surface perpendicular to the anamotic axis of the tibia and in 3 degrees of posterior slope.  The cut surface and alignment jig was removed.  I then used the intramedullary alignment guide to make a 6 valgus cut on the distal femur.  I then marked out the epicondylar axis on the distal femur.  The posterior condylar axis measured 3 degrees.  I then used the anterior referencing sizer and measured the femur to be a size 11.  The 4-In-1 cutting block was screwed into place in external rotation matching the posterior condylar angle, making our cuts perpendicular to the epicondylar axis.  Anterior, posterior and chamfer cuts were made with the sagittal saw.  The cutting block and cut pieces were removed.  A lamina spreader was  placed in 90 degrees of flexion.  The ACL, PCL, menisci, and posterior condylar osteophytes were removed.  A 11 mm spacer blocked was found to offer good flexion and extension gap balance after moderate in degree releasing.   The scoop retractor was then placed and the femoral finishing block was pinned in place.  The small sagittal saw was used as well as the lug drill to finish the femur.  The block and cut surfaces were  removed and the medullary canal hole filled with autograft bone from the cut pieces.  The tibia was delivered forward in deep flexion and external rotation.  A size G tray was selected and pinned into place centered on the medial 1/3 of the tibial tubercle.  The reamer and keel was used to prepare the tibia through the tray.    I then trialed with the size 11 femur, size G tibia, a 11 mm insert and the 38 patella.  I had excellent flexion/extension gap balance, excellent patella tracking.  Flexion was full and beyond 120 degrees; extension was zero.  These components were chosen and the staff opened them to me on the back table while the knee was lavaged copiously and the cement mixed.  The soft tissue was infiltrated with 60cc of exparel 1.3% through a 21 gauge needle.  I cemented in the components and removed all excess cement.  The polyethylene tibial component was snapped into place and the knee placed in extension while cement was hardening.  The capsule was infilltrated with 30cc of .25% Marcaine with epinephrine.  A hemovac was place in the joint exiting superolaterally.  A pain pump was place superomedially superficial to the arthrotomy.  Once the cement was hard, the tourniquet was let down.  Hemostasis was obtained.  The arthrotomy was closed with figure-8 #1 vicryl sutures.  The deep soft tissues were closed with #0 vicryls and the subcuticular layer closed with a running #2-0 vicryl.  The skin was reapproximated and closed with skin staples.  The wound was dressed with xeroform, 4 x4's, 2 ABD sponges, a single layer of webril and a TED stocking.   The patient was then awakened, extubated, and taken to the recovery room in stable condition.  BLOOD LOSS:  300cc DRAINS: 1 hemovac, 1 pain catheter COMPLICATIONS:  None.  PLAN OF CARE: Admit for overnight observation  PATIENT DISPOSITION:  PACU - hemodynamically stable.   Delay start of Pharmacological VTE agent (>24hrs) due to surgical blood  loss or risk of bleeding:  not applicable  Please fax a copy of this op note to my office at [phone] (please only include page 1 and 2 of the Case Information op note)

## 2016-02-07 NOTE — Care Management Obs Status (Signed)
[facility] NOTIFICATION   Patient Details  Name: Jason Robles MRN: [id number] Date of Birth: 1945/12/29   Medicare Observation Status Notification Given:  Yes    Ninfa Meeker, RN 02/07/2016, 11:05 AM

## 2016-02-07 NOTE — Evaluation (Signed)
Occupational Therapy Evaluation Patient Details Name: Jason Robles MRN: [id number] DOB: 1945/08/19 Today's Date: 02/07/2016    History of Present Illness 71 y.o. male admitted to [facility] on 02/06/16 for elective L TKA.  Pt with significant PMHx of PVD, R ear hearing loss, HA, chest pain, lumbar disc surgery, L shoulder surgery, and bil knee surgery.     Clinical Impression   PTA, pt was independent with ADL and functional mobility and works full time as a Tourist information centre manager. Pt currently requires min assist with LB ADL and min guard assist for shower transfer. Pt able to complete grooming at sink as well as toilet transfer with supervision. All education complete with pt and wife concerning knee precautions during ADL, safe use of DME for shower transfers, energy conservation, and fall prevention strategies. No further acute OT needs identified and pt/family report no further questions. OT will sign off acutely.     Follow Up Recommendations  No OT follow up;Supervision - Intermittent    Equipment Recommendations  3 in 1 bedside commode    Recommendations for Other Services       Precautions / Restrictions Precautions Precautions: Knee Precaution Comments: Reviewed knee precautions. Restrictions Weight Bearing Restrictions: Yes LLE Weight Bearing: Weight bearing as tolerated      Mobility Bed Mobility Overal bed mobility: Modified Independent             General bed mobility comments: Was able to return to bed without physical assistance.  Transfers Overall transfer level: Needs assistance Equipment used: Rolling walker (2 wheeled) Transfers: Sit to/from Stand Sit to Stand: Supervision         General transfer comment: Pt demo safe technique.    Balance Overall balance assessment: Needs assistance Sitting-balance support: No upper extremity supported;Feet supported Sitting balance-Leahy Scale: Good     Standing balance support: Bilateral upper extremity  supported;No upper extremity supported;During functional activity Standing balance-Leahy Scale: Fair Standing balance comment: Able to stand at sink without UE support to brush teeth. Reliant on B UE support for dynamic standing activity.                            ADL Overall ADL's : Needs assistance/impaired     Grooming: Supervision/safety;Standing   Upper Body Bathing: Set up;Sitting   Lower Body Bathing: Minimal assistance;Sit to/from stand   Upper Body Dressing : Set up;Sitting   Lower Body Dressing: Minimal assistance;Sit to/from stand   Toilet Transfer: Supervision/safety;Ambulation;RW;BSC   Toileting- Water quality scientist and Hygiene: Supervision/safety;Sit to/from stand   Tub/ Shower Transfer: Walk-in shower;Min guard;Ambulation;Rolling walker   Functional mobility during ADLs: Supervision/safety;Rolling walker       Vision Vision Assessment?: No apparent visual deficits   Perception     Praxis      Pertinent Vitals/Pain Pain Assessment: Faces Pain Score: 2  Faces Pain Scale: Hurts even more Pain Location: left knee Pain Descriptors / Indicators: Sore Pain Intervention(s): Monitored during session;Repositioned;Ice applied     Hand Dominance Right   Extremity/Trunk Assessment Upper Extremity Assessment Upper Extremity Assessment: Overall WFL for tasks assessed   Lower Extremity Assessment Lower Extremity Assessment: Defer to PT evaluation       Communication Communication Communication: HOH (R ear)   Cognition Arousal/Alertness: Awake/alert Behavior During Therapy: WFL for tasks assessed/performed Overall Cognitive Status: Within Functional Limits for tasks assessed  General Comments       Exercises       Shoulder Instructions      Home Living Family/patient expects to be discharged to:: Private residence Living Arrangements: Spouse/significant other Available Help at Discharge: Family;Available  24 hours/day (for 2 weeks) Type of Home: House Home Access: Stairs to enter [employer] of Steps: 3 Entrance Stairs-Rails: None Home Layout: Two level Alternate Level Stairs-Number of Steps: flight Alternate Level Stairs-Rails: Left Bathroom Shower/Tub: Occupational psychologist: Standard     Home Equipment: Environmental consultant - 2 wheels (CPM)          Prior Functioning/Environment Level of Independence: Independent        Comments: 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  OT Goal Formulation: With patient/family Time For Goal Achievement: 02/21/16 Potential to Achieve Goals: Good  OT Frequency:     Barriers to D/C:            Co-evaluation              End of Session Equipment Utilized During Treatment: Gait belt;Rolling walker CPM Left Knee CPM Left Knee: On Nurse Communication: Mobility status  Activity Tolerance: Patient tolerated treatment well Patient left: in bed;with call bell/phone within reach;with family/visitor present   Time: 0940-1008 OT Time Calculation (min): 28 min Charges:  OT General Charges $OT Visit: 1 Procedure OT Evaluation $OT Eval Moderate Complexity: 1 Procedure OT Treatments $Self Care/Home Management : 8-22 mins G-Codes: OT G-codes **NOT FOR INPATIENT CLASS** Functional Assessment Tool Used: clinical judgement Functional Limitation: Self care Self Care Current Status [id number]): At least 1 percent but less than 20 percent impaired, limited or restricted Self Care Goal Status  [id number]): At least 1 percent but less than 20 percent impaired, limited or restricted Self Care Discharge Status [phone]): At least 1 percent but less than 20 percent impaired, limited or restricted  Norman Herrlich, OTR/L [phone] 02/07/2016, 12:26 PM

## 2016-02-07 NOTE — Progress Notes (Signed)
Physical Therapy Treatment Patient Details Name: Jason Robles MRN: [id number] DOB: 01-06-1945 Today's Date: 02/07/2016    History of Present Illness 71 y.o. male admitted to [facility] on 02/06/16 for elective L TKA.  Pt with significant PMHx of PVD, R ear hearing loss, HA, chest pain, lumbar disc surgery, L shoulder surgery, and bil knee surgery.      PT Comments    Pt to d/c home today. All education complete. Pt is mod I with bed mobility, transfers and in room ambulation with RW. Supervision provided for hallway ambulation and ascend/descend stairs with rail. Pt to continue mobility progress with HHPT.    Follow Up Recommendations  Home health PT;Supervision for mobility/OOB     Equipment Recommendations  None recommended by PT    Recommendations for Other Services       Precautions / Restrictions Precautions Precautions: Knee Precaution Comments: Reviewed knee precautions. Restrictions LLE Weight Bearing: Weight bearing as tolerated    Mobility  Bed Mobility Overal bed mobility: Modified Independent                Transfers Overall transfer level: Modified independent Equipment used: Rolling walker (2 wheeled)             General transfer comment: Pt demo safe technique.  Ambulation/Gait Ambulation/Gait assistance: Supervision Ambulation Distance (Feet): 250 Feet Assistive device: Rolling walker (2 wheeled) Gait Pattern/deviations: Step-through pattern;Decreased stride length Gait velocity: decreased Gait velocity interpretation: Below normal speed for age/gender General Gait Details: Pt demo good effort with heel strike and toe off LLE   Stairs Stairs: Yes   Stair Management: One rail Left;Step to pattern;Sideways Number of Stairs: 5 General stair comments: Also completed training 3 steps backwards with RW, min assist to stabilize RW. Pt demo good technique with both methods.  Wheelchair Mobility    Modified Rankin (Stroke Patients Only)        Balance           Standing balance support: Bilateral upper extremity supported;During functional activity Standing balance-Leahy Scale: Fair                      Cognition Arousal/Alertness: Awake/alert Behavior During Therapy: WFL for tasks assessed/performed Overall Cognitive Status: Within Functional Limits for tasks assessed                      Exercises Total Joint Exercises Ankle Circles/Pumps: AROM;Both;20 reps Quad Sets: AROM;Left;10 reps Towel Squeeze: AROM;Both;10 reps Short Arc Quad: AROM;Left;10 reps Heel Slides: AROM;Left;10 reps Hip ABduction/ADduction: AROM;Left;10 reps Straight Leg Raises: AROM;Left;10 reps Goniometric ROM: 0-95 degrees    General Comments        Pertinent Vitals/Pain Pain Assessment: 0-10 Pain Score: 2  Pain Location: left knee Pain Descriptors / Indicators: Sore Pain Intervention(s): Repositioned;Ice applied;Monitored during session    Home Living                      Prior Function            PT Goals (current goals can now be found in the care plan section) Acute Rehab PT Goals Patient Stated Goal: to get back to work, have his other knee done in 6-8 weeks.  PT Goal Formulation: With patient Time For Goal Achievement: 02/13/16 Potential to Achieve Goals: Good Progress towards PT goals: Goals met/education completed, patient discharged from PT    Frequency    7X/week      PT Plan  Current plan remains appropriate    Co-evaluation             End of Session Equipment Utilized During Treatment: Gait belt Activity Tolerance: Patient tolerated treatment well Patient left: in chair;with call bell/phone within reach;with family/visitor present     Time: 8833-7445 PT Time Calculation (min) (ACUTE ONLY): 38 min  Charges:  $Gait Training: 23-37 mins $Therapeutic Exercise: 8-22 mins                    G Codes:  Functional Assessment Tool Used: clinical judgement Functional  Limitation: Mobility: Walking and moving around Mobility: Walking and Moving Around Current Status [phone]): At least 1 percent but less than 20 percent impaired, limited or restricted Mobility: Walking and Moving Around Goal Status [phone]): 0 percent impaired, limited or restricted Mobility: Walking and Moving Around Discharge Status [phone]): At least 1 percent but less than 20 percent impaired, limited or restricted   Lorriane Shire 02/07/2016, 10:16 AM

## 2016-02-07 NOTE — Discharge Summary (Signed)
SPORTS MEDICINE & JOINT REPLACEMENT   Lara Mulch, MD   Carlyon Shadow, PA-C [address], [place], [place]  [id number]                             [phone]  PATIENT ID: Jason Robles        MRN:  [id number]          DOB/AGE: 05/06/1945 / 71 y.o.    DISCHARGE SUMMARY  ADMISSION DATE:    02/06/2016 DISCHARGE DATE:   02/07/2016   ADMISSION DIAGNOSIS: primary osteoarthritis left knee    DISCHARGE DIAGNOSIS:  primary osteoarthritis left knee    ADDITIONAL DIAGNOSIS: Active Problems:   S/P total knee replacement  Past Medical History:  Diagnosis Date  . ABDOMINAL PAIN OTHER SPECIFIED SITE 04/30/2008  . ALLERGIC RHINITIS 08/25/2007  . Anxiety 06/28/2010  . BENIGN PROSTATIC HYPERTROPHY 08/21/2006  . Chest pain 06/28/2010  . COLONIC POLYPS, HX OF 04/18/2007  . Complication of anesthesia   . 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 Diverticulosis of colon (without mention of hemorrhage) 04/18/2007  . EXTERNAL HEMORRHOIDS 04/18/2007  . Headache(784.0) 09/20/2009   hx migraines  . HEARING LOSS, 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  . HYPERLIPIDEMIA 08/21/2006  . INSOMNIA-SLEEP DISORDER-UNSPEC 09/20/2009  . NONSPECIFIC ABN FINDING RAD & OTH EXAM GU ORGAN 05/03/2008  . PERIPHERAL VASCULAR DISEASE 08/25/2007  . PONV (postoperative nausea and vomiting)   . Raynaud's syndrome 08/21/2006    PROCEDURE: Procedure(s): TOTAL KNEE ARTHROPLASTY on 02/06/2016  CONSULTS:    HISTORY:  See H&P in chart  HOSPITAL COURSE:  Jason Robles is a 71 y.o. admitted on 02/06/2016 and found to have a diagnosis of primary osteoarthritis left knee.  After appropriate laboratory studies were obtained  they were taken to the operating room on 02/06/2016 and underwent Procedure(s): TOTAL KNEE ARTHROPLASTY.   They were given perioperative antibiotics:  Anti-infectives    Start     Dose/Rate Route Frequency Ordered Stop   02/06/16 1400  ceFAZolin (ANCEF) IVPB 1 g/50 mL premix     1 g 100 mL/hr over 30  Minutes Intravenous Every 6 hours 02/06/16 1237 02/06/16 2106   02/06/16 0700  ceFAZolin (ANCEF) IVPB 2g/100 mL premix     2 g 200 mL/hr over 30 Minutes Intravenous To ShortStay Surgical 02/03/16 1000 02/06/16 0737    .  Patient given tranexamic acid IV or topical and exparel intra-operatively.  Tolerated the procedure well.    POD# 1: Vital signs were stable.  Patient denied Chest pain, shortness of breath, or calf pain.  Patient was started on Lovenox 30 mg subcutaneously twice daily at 8am.  Consults to PT, OT, and care management were made.  The patient was weight bearing as tolerated.  CPM was placed on the operative leg 0-90 degrees for 6-8 hours a day. When out of the CPM, patient was placed in the foam block to achieve full extension. Incentive spirometry was taught.  Dressing was changed.       POD #2, Continued  PT for ambulation and exercise program.  IV saline locked.  O2 discontinued.    The remainder of the hospital course was dedicated to ambulation and strengthening.   The patient was discharged on 1 Day Post-Op in  Good condition.  Blood products given:none  DIAGNOSTIC STUDIES: Recent vital signs: Patient Vitals for the past 24 hrs:  BP Temp Temp src Pulse Resp SpO2  02/07/16 0424  120/71 97.8 F (36.6 C) Oral 65 16 99 %  02/06/16 2356 123/76 97.7 F (36.5 C) Oral 73 16 97 %  02/06/16 2231 133/80 98.2 F (36.8 C) Oral 91 16 97 %  02/06/16 1303 135/77 97.6 F (36.4 C) Oral 68 15 99 %       Recent laboratory studies:  Recent Labs  01/31/16 1438 02/07/16 0712  WBC 7.1 10.4  HGB 14.0 11.7*  HCT 41.6 35.6*  PLT 141* 149*    Recent Labs  01/31/16 1438 02/07/16 0712  NA 137 138  K 4.4 4.3  CL 103 99*  CO2 27 30  BUN 16 13  CREATININE 0.93 0.90  GLUCOSE 94 122*  CALCIUM 9.7 8.9   Lab Results  Component Value Date   INR 0.89 05/02/2012     Recent Radiographic Studies :  No results found.  DISCHARGE INSTRUCTIONS:   DISCHARGE MEDICATIONS:    Allergies as of 02/07/2016      Reactions   Iohexol Itching   Itching post 125cc Omni injection. Treated w/ 50 mg PO benedryl.  Onset Date: [id number]      Medication List    STOP taking these medications   aspirin 81 MG tablet Replaced by:  aspirin 325 MG EC tablet   meloxicam 7.5 MG tablet Commonly known as:  MOBIC     TAKE these medications   aspirin 325 MG EC tablet Take 1 tablet (325 mg total) by mouth 2 (two) times daily. Replaces:  aspirin 81 MG tablet   atorvastatin 10 MG tablet Commonly known as:  LIPITOR TAKE 1 TABLET(10 MG) BY MOUTH DAILY   methocarbamol 500 MG tablet Commonly known as:  ROBAXIN Take 1-2 tablets (500-1,000 mg total) by mouth every 6 (six) hours as needed for muscle spasms.   Oxycodone HCl 10 MG Tabs Take 1 tablet (10 mg total) by mouth every 3 (three) hours as needed for breakthrough pain.   TYLENOL ARTHRITIS PAIN 650 MG CR tablet Generic drug:  acetaminophen Take 2 tablets by mouth at bedtime.   zolpidem 10 MG tablet Commonly known as:  AMBIEN Take 1 tablet (10 mg total) by mouth at bedtime.            Durable Medical Equipment        Start     Ordered   02/06/16 1238  DME Walker rolling  Once    Question:  Patient needs a walker to treat with the following condition  Answer:  S/P total knee replacement   02/06/16 1237   02/06/16 1238  DME 3 n 1  Once     02/06/16 1237   02/06/16 1238  DME Bedside commode  Once    Question:  Patient needs a bedside commode to treat with the following condition  Answer:  S/P total knee replacement   02/06/16 1237      FOLLOW UP VISIT:   Follow-up Information    [place] Follow up.   Why:  Someone from [facility] will contact you to arrange start date and time for therapy. Contact information: [address] [place] [address] [phone]           DISPOSITION: HOME VS. SNF  CONDITION:  Keenan Bachelor 02/07/2016, 12:31 PM

## 2016-02-07 NOTE — Progress Notes (Signed)
SPORTS MEDICINE AND JOINT REPLACEMENT  Lara Mulch, MD    Carlyon Shadow, PA-C [address], [place], [place]  [address]                             [phone]   PROGRESS NOTE  Subjective:  negative for Chest Pain  negative for Shortness of Breath  negative for Nausea/Vomiting   negative for Calf Pain  negative for Bowel Movement   Tolerating Diet: yes         Patient reports pain as 4 on 0-10 scale.    Objective: Vital signs in last 24 hours:   Patient Vitals for the past 24 hrs:  BP Temp Temp src Pulse Resp SpO2  02/07/16 0424 120/71 97.8 F (36.6 C) Oral 65 16 99 %  02/06/16 2356 123/76 97.7 F (36.5 C) Oral 73 16 97 %  02/06/16 2231 133/80 98.2 F (36.8 C) Oral 91 16 97 %  02/06/16 1303 135/77 97.6 F (36.4 C) Oral 68 15 99 %  02/06/16 1200 130/69 - - 64 10 99 %  02/06/16 1145 118/81 - - 67 17 100 %  02/06/16 1130 135/79 - - 63 16 99 %  02/06/16 1115 125/72 - - 60 19 99 %  02/06/16 1100 132/75 98.1 F (36.7 C) - (!) 55 14 100 %  02/06/16 1045 122/74 - - (!) 54 14 100 %  02/06/16 1030 135/78 - - (!) 58 19 100 %  02/06/16 1015 124/75 - - (!) 57 14 99 %  02/06/16 1000 113/72 - - (!) 55 11 100 %  02/06/16 0945 - - - 63 11 98 %  02/06/16 0942 104/68 97 F (36.1 C) - 63 12 100 %    @flow {1959:LAST@   Intake/Output from previous day:   02/05 0701 - 02/06 0700 In: 1803.8 [P.O.:480; I.V.:1173.8] Out: 1300 [Urine:1250]   Intake/Output this shift:   No intake/output data recorded.   Intake/Output      02/05 0701 - 02/06 0700 02/06 0701 - 02/07 0700   P.O. 480    I.V. 1173.8    IV Piggyback 150    Total Intake 1803.8     Urine 1250    Blood 50    Total Output 1300     Net +503.8             LABORATORY DATA:  Recent Labs  01/31/16 1438  WBC 7.1  HGB 14.0  HCT 41.6  PLT 141*    Recent Labs  01/31/16 1438  NA 137  K 4.4  CL 103  CO2 27  BUN 16  CREATININE 0.93  GLUCOSE 94  CALCIUM 9.7   Lab Results  Component Value Date   INR  0.89 05/02/2012    Examination:  General appearance: alert, cooperative and no distress Extremities: extremities normal, atraumatic, no cyanosis or edema  Wound Exam: clean, dry, intact   Drainage:  None: wound tissue dry  Motor Exam: Quadriceps and Hamstrings Intact  Sensory Exam: Superficial Peroneal, Deep Peroneal and Tibial normal   Assessment:    1 Day Post-Op  Procedure(s) (LRB): TOTAL KNEE ARTHROPLASTY (Left)  ADDITIONAL DIAGNOSIS:  Active Problems:   S/P total knee replacement     Plan: Physical Therapy as ordered Weight Bearing as Tolerated (WBAT)  DVT Prophylaxis:  Aspirin  DISCHARGE PLAN: Home  DISCHARGE NEEDS: HHPT   Patient doing great, will D/C today after completion of PT  Donia Ast 02/07/2016, 7:06 AM

## 2016-02-08 DIAGNOSIS — Z471 Aftercare following joint replacement surgery: Secondary | ICD-10-CM | POA: Diagnosis not present

## 2016-02-08 DIAGNOSIS — I7300 Raynaud's syndrome without gangrene: Secondary | ICD-10-CM | POA: Diagnosis not present

## 2016-02-08 DIAGNOSIS — M150 Primary generalized (osteo)arthritis: Secondary | ICD-10-CM | POA: Diagnosis not present

## 2016-02-08 DIAGNOSIS — M1711 Unilateral primary osteoarthritis, right knee: Secondary | ICD-10-CM | POA: Diagnosis not present

## 2016-02-08 DIAGNOSIS — Z96652 Presence of left artificial knee joint: Secondary | ICD-10-CM | POA: Diagnosis not present

## 2016-02-08 DIAGNOSIS — R69 Illness, unspecified: Secondary | ICD-10-CM | POA: Diagnosis not present

## 2016-02-08 DIAGNOSIS — Z7982 Long term (current) use of aspirin: Secondary | ICD-10-CM | POA: Diagnosis not present

## 2016-02-10 DIAGNOSIS — Z96652 Presence of left artificial knee joint: Secondary | ICD-10-CM | POA: Diagnosis not present

## 2016-02-10 DIAGNOSIS — Z471 Aftercare following joint replacement surgery: Secondary | ICD-10-CM | POA: Diagnosis not present

## 2016-02-10 DIAGNOSIS — R69 Illness, unspecified: Secondary | ICD-10-CM | POA: Diagnosis not present

## 2016-02-10 DIAGNOSIS — M150 Primary generalized (osteo)arthritis: Secondary | ICD-10-CM | POA: Diagnosis not present

## 2016-02-10 DIAGNOSIS — M1711 Unilateral primary osteoarthritis, right knee: Secondary | ICD-10-CM | POA: Diagnosis not present

## 2016-02-10 DIAGNOSIS — I7300 Raynaud's syndrome without gangrene: Secondary | ICD-10-CM | POA: Diagnosis not present

## 2016-02-10 DIAGNOSIS — Z7982 Long term (current) use of aspirin: Secondary | ICD-10-CM | POA: Diagnosis not present

## 2016-02-13 DIAGNOSIS — M1711 Unilateral primary osteoarthritis, right knee: Secondary | ICD-10-CM | POA: Diagnosis not present

## 2016-02-13 DIAGNOSIS — Z7982 Long term (current) use of aspirin: Secondary | ICD-10-CM | POA: Diagnosis not present

## 2016-02-13 DIAGNOSIS — I7300 Raynaud's syndrome without gangrene: Secondary | ICD-10-CM | POA: Diagnosis not present

## 2016-02-13 DIAGNOSIS — R69 Illness, unspecified: Secondary | ICD-10-CM | POA: Diagnosis not present

## 2016-02-13 DIAGNOSIS — M150 Primary generalized (osteo)arthritis: Secondary | ICD-10-CM | POA: Diagnosis not present

## 2016-02-13 DIAGNOSIS — Z96652 Presence of left artificial knee joint: Secondary | ICD-10-CM | POA: Diagnosis not present

## 2016-02-13 DIAGNOSIS — Z471 Aftercare following joint replacement surgery: Secondary | ICD-10-CM | POA: Diagnosis not present

## 2016-02-15 DIAGNOSIS — R69 Illness, unspecified: Secondary | ICD-10-CM | POA: Diagnosis not present

## 2016-02-15 DIAGNOSIS — Z471 Aftercare following joint replacement surgery: Secondary | ICD-10-CM | POA: Diagnosis not present

## 2016-02-15 DIAGNOSIS — Z96652 Presence of left artificial knee joint: Secondary | ICD-10-CM | POA: Diagnosis not present

## 2016-02-15 DIAGNOSIS — I7300 Raynaud's syndrome without gangrene: Secondary | ICD-10-CM | POA: Diagnosis not present

## 2016-02-15 DIAGNOSIS — M1711 Unilateral primary osteoarthritis, right knee: Secondary | ICD-10-CM | POA: Diagnosis not present

## 2016-02-15 DIAGNOSIS — M150 Primary generalized (osteo)arthritis: Secondary | ICD-10-CM | POA: Diagnosis not present

## 2016-02-15 DIAGNOSIS — Z7982 Long term (current) use of aspirin: Secondary | ICD-10-CM | POA: Diagnosis not present

## 2016-02-16 DIAGNOSIS — R262 Difficulty in walking, not elsewhere classified: Secondary | ICD-10-CM | POA: Diagnosis not present

## 2016-02-16 DIAGNOSIS — G8929 Other chronic pain: Secondary | ICD-10-CM | POA: Diagnosis not present

## 2016-02-16 DIAGNOSIS — M25562 Pain in left knee: Secondary | ICD-10-CM | POA: Diagnosis not present

## 2016-02-16 DIAGNOSIS — Z96652 Presence of left artificial knee joint: Secondary | ICD-10-CM | POA: Diagnosis not present

## 2016-02-16 DIAGNOSIS — Z471 Aftercare following joint replacement surgery: Secondary | ICD-10-CM | POA: Diagnosis not present

## 2016-02-16 DIAGNOSIS — M25461 Effusion, right knee: Secondary | ICD-10-CM | POA: Diagnosis not present

## 2016-02-16 DIAGNOSIS — M2342 Loose body in knee, left knee: Secondary | ICD-10-CM | POA: Diagnosis not present

## 2016-02-16 DIAGNOSIS — M25462 Effusion, left knee: Secondary | ICD-10-CM | POA: Diagnosis not present

## 2016-02-16 DIAGNOSIS — M25662 Stiffness of left knee, not elsewhere classified: Secondary | ICD-10-CM | POA: Diagnosis not present

## 2016-02-20 DIAGNOSIS — M25662 Stiffness of left knee, not elsewhere classified: Secondary | ICD-10-CM | POA: Diagnosis not present

## 2016-02-20 DIAGNOSIS — G8929 Other chronic pain: Secondary | ICD-10-CM | POA: Diagnosis not present

## 2016-02-20 DIAGNOSIS — M25562 Pain in left knee: Secondary | ICD-10-CM | POA: Diagnosis not present

## 2016-02-20 DIAGNOSIS — R262 Difficulty in walking, not elsewhere classified: Secondary | ICD-10-CM | POA: Diagnosis not present

## 2016-02-20 DIAGNOSIS — M25462 Effusion, left knee: Secondary | ICD-10-CM | POA: Diagnosis not present

## 2016-02-20 DIAGNOSIS — Z96652 Presence of left artificial knee joint: Secondary | ICD-10-CM | POA: Diagnosis not present

## 2016-02-21 ENCOUNTER — Other Ambulatory Visit: Payer: Self-pay | Admitting: Orthopedic Surgery

## 2016-02-23 ENCOUNTER — Telehealth: Payer: Self-pay

## 2016-02-23 DIAGNOSIS — G8929 Other chronic pain: Secondary | ICD-10-CM | POA: Diagnosis not present

## 2016-02-23 DIAGNOSIS — M25562 Pain in left knee: Secondary | ICD-10-CM | POA: Diagnosis not present

## 2016-02-23 DIAGNOSIS — R262 Difficulty in walking, not elsewhere classified: Secondary | ICD-10-CM | POA: Diagnosis not present

## 2016-02-23 DIAGNOSIS — M25662 Stiffness of left knee, not elsewhere classified: Secondary | ICD-10-CM | POA: Diagnosis not present

## 2016-02-23 DIAGNOSIS — M25462 Effusion, left knee: Secondary | ICD-10-CM | POA: Diagnosis not present

## 2016-02-23 MED ORDER — ATORVASTATIN CALCIUM 10 MG PO TABS: 10.0000 mg | ORAL_TABLET | Freq: Every day | ORAL | 1 refills | Status: DC

## 2016-02-23 MED ORDER — ZOLPIDEM TARTRATE 10 MG PO TABS: 10.0000 mg | ORAL_TABLET | Freq: Every day | ORAL | 1 refills | Status: DC

## 2016-02-23 NOTE — Telephone Encounter (Signed)
rq rf for [facility]. Please advise.

## 2016-02-23 NOTE — Telephone Encounter (Signed)
Done hardcopy to Jason Robles 

## 2016-02-24 NOTE — Telephone Encounter (Signed)
RX faxed to [facility] 

## 2016-02-28 DIAGNOSIS — M25462 Effusion, left knee: Secondary | ICD-10-CM | POA: Diagnosis not present

## 2016-02-28 DIAGNOSIS — R262 Difficulty in walking, not elsewhere classified: Secondary | ICD-10-CM | POA: Diagnosis not present

## 2016-02-28 DIAGNOSIS — Z96652 Presence of left artificial knee joint: Secondary | ICD-10-CM | POA: Diagnosis not present

## 2016-02-28 DIAGNOSIS — M25662 Stiffness of left knee, not elsewhere classified: Secondary | ICD-10-CM | POA: Diagnosis not present

## 2016-02-28 DIAGNOSIS — G8929 Other chronic pain: Secondary | ICD-10-CM | POA: Diagnosis not present

## 2016-02-28 DIAGNOSIS — M25562 Pain in left knee: Secondary | ICD-10-CM | POA: Diagnosis not present

## 2016-03-01 DIAGNOSIS — R262 Difficulty in walking, not elsewhere classified: Secondary | ICD-10-CM | POA: Diagnosis not present

## 2016-03-01 DIAGNOSIS — Z96652 Presence of left artificial knee joint: Secondary | ICD-10-CM | POA: Diagnosis not present

## 2016-03-01 DIAGNOSIS — G8929 Other chronic pain: Secondary | ICD-10-CM | POA: Diagnosis not present

## 2016-03-01 DIAGNOSIS — M25662 Stiffness of left knee, not elsewhere classified: Secondary | ICD-10-CM | POA: Diagnosis not present

## 2016-03-01 DIAGNOSIS — M25562 Pain in left knee: Secondary | ICD-10-CM | POA: Diagnosis not present

## 2016-03-01 DIAGNOSIS — M25462 Effusion, left knee: Secondary | ICD-10-CM | POA: Diagnosis not present

## 2016-03-06 DIAGNOSIS — Z96652 Presence of left artificial knee joint: Secondary | ICD-10-CM | POA: Diagnosis not present

## 2016-03-06 DIAGNOSIS — M25562 Pain in left knee: Secondary | ICD-10-CM | POA: Diagnosis not present

## 2016-03-06 DIAGNOSIS — M25462 Effusion, left knee: Secondary | ICD-10-CM | POA: Diagnosis not present

## 2016-03-06 DIAGNOSIS — R262 Difficulty in walking, not elsewhere classified: Secondary | ICD-10-CM | POA: Diagnosis not present

## 2016-03-06 DIAGNOSIS — M25662 Stiffness of left knee, not elsewhere classified: Secondary | ICD-10-CM | POA: Diagnosis not present

## 2016-03-06 DIAGNOSIS — G8929 Other chronic pain: Secondary | ICD-10-CM | POA: Diagnosis not present

## 2016-03-08 DIAGNOSIS — Z96652 Presence of left artificial knee joint: Secondary | ICD-10-CM | POA: Diagnosis not present

## 2016-03-08 DIAGNOSIS — R262 Difficulty in walking, not elsewhere classified: Secondary | ICD-10-CM | POA: Diagnosis not present

## 2016-03-08 DIAGNOSIS — M25662 Stiffness of left knee, not elsewhere classified: Secondary | ICD-10-CM | POA: Diagnosis not present

## 2016-03-08 DIAGNOSIS — G8929 Other chronic pain: Secondary | ICD-10-CM | POA: Diagnosis not present

## 2016-03-08 DIAGNOSIS — M25462 Effusion, left knee: Secondary | ICD-10-CM | POA: Diagnosis not present

## 2016-03-08 DIAGNOSIS — M25562 Pain in left knee: Secondary | ICD-10-CM | POA: Diagnosis not present

## 2016-03-13 DIAGNOSIS — G8929 Other chronic pain: Secondary | ICD-10-CM | POA: Diagnosis not present

## 2016-03-13 DIAGNOSIS — M25562 Pain in left knee: Secondary | ICD-10-CM | POA: Diagnosis not present

## 2016-03-13 DIAGNOSIS — M25662 Stiffness of left knee, not elsewhere classified: Secondary | ICD-10-CM | POA: Diagnosis not present

## 2016-03-13 DIAGNOSIS — M25462 Effusion, left knee: Secondary | ICD-10-CM | POA: Diagnosis not present

## 2016-03-13 DIAGNOSIS — Z96652 Presence of left artificial knee joint: Secondary | ICD-10-CM | POA: Diagnosis not present

## 2016-03-13 DIAGNOSIS — R262 Difficulty in walking, not elsewhere classified: Secondary | ICD-10-CM | POA: Diagnosis not present

## 2016-03-13 NOTE — Progress Notes (Addendum)
PCP:Dr. Cathlean Cower  Cardiologist: pt denies  EKG: 01/31/16 in EPIC  ECHO: 05/2012  In EPIC  Stress Test: 2012 in EPIC  Cardiac Cath: pt denies ever  Chest x-ray: pt denies

## 2016-03-13 NOTE — Pre-Procedure Instructions (Addendum)
    Demetrice Combes Klier  03/13/2016      [id number] - [address], [place] [address] Phone: [phone] Fax: [phone]    Your procedure is scheduled on Mon. Mar. 26, 2018 at 0815.  Report to [facility] Admitting at [place].  Call this number if you have problems the morning of surgery:  [phone]   Remember:  Do not eat food or drink liquids after midnight.  Take these medicines the morning of surgery with A SIP OF WATER oxycodone 10 mg (if needed) and methocarbamol (robaxin).  Stop taking aspirin, ibuprofen, any herbal medications or vitamins. Goody's, BC's, Advil, Aleve.   Do not wear jewelry, make-up or nail polish.  Do not wear lotions, powders, or colognes, or deoderant.  Men may shave face and neck.  Do not bring valuables to the hospital.  [facility] is not responsible for any belongings or valuables.  Contacts, dentures or bridgework may not be worn into surgery.  Leave your suitcase in the car.  After surgery it may be brought to your room.  For patients admitted to the hospital, discharge time will be determined by your treatment team.  Patients discharged the day of surgery will not be allowed to drive home.    Special instructions:  See attached  Please read over the following fact sheets that you were given. Pain Booklet, Coughing and Deep Breathing, MRSA Information and Surgical Site Infection Prevention

## 2016-03-14 ENCOUNTER — Encounter (HOSPITAL_COMMUNITY): Payer: Self-pay

## 2016-03-14 ENCOUNTER — Encounter (HOSPITAL_COMMUNITY)
Admission: RE | Admit: 2016-03-14 | Discharge: 2016-03-14 | Disposition: A | Discharge status: Home / Self Care | Payer: Medicare HMO | Source: Ambulatory Visit | Attending: Orthopedic Surgery | Admitting: Orthopedic Surgery

## 2016-03-14 DIAGNOSIS — M1711 Unilateral primary osteoarthritis, right knee: Secondary | ICD-10-CM | POA: Insufficient documentation

## 2016-03-14 DIAGNOSIS — Z01812 Encounter for preprocedural laboratory examination: Secondary | ICD-10-CM | POA: Diagnosis not present

## 2016-03-14 LAB — COMPREHENSIVE METABOLIC PANEL
ALBUMIN: 3.9 g/dL (ref 3.5–5.0)
ALT: 13 U/L — ABNORMAL LOW (ref 17–63)
ANION GAP: 9 (ref 5–15)
AST: 16 U/L (ref 15–41)
Alkaline Phosphatase: 62 U/L (ref 38–126)
BUN: 22 mg/dL — ABNORMAL HIGH (ref 6–20)
CHLORIDE: 98 mmol/L — AB (ref 101–111)
CO2: 31 mmol/L (ref 22–32)
Calcium: 9.9 mg/dL (ref 8.9–10.3)
Creatinine, Ser: 1.03 mg/dL (ref 0.61–1.24)
GFR calc Af Amer: >60 mL/min (ref >60)
GLUCOSE: 102 mg/dL — AB (ref 65–99)
POTASSIUM: 4.5 mmol/L (ref 3.5–5.1)
Sodium: 138 mmol/L (ref 135–145)
TOTAL PROTEIN: 6.9 g/dL (ref 6.5–8.1)
Total Bilirubin: 0.5 mg/dL (ref 0.3–1.2)

## 2016-03-14 LAB — CBC WITH DIFFERENTIAL/PLATELET
BASOS ABS: 0 10*3/uL (ref 0.0–0.1)
BASOS PCT: 0 %
EOS PCT: 2 %
Eosinophils Absolute: 0.2 10*3/uL (ref 0.0–0.7)
HCT: 42.3 % (ref 39.0–52.0)
Hemoglobin: 13.7 g/dL (ref 13.0–17.0)
Lymphocytes Relative: 34 %
Lymphs Abs: 2.2 10*3/uL (ref 0.7–4.0)
MCH: 28.4 pg (ref 26.0–34.0)
MCHC: 32.4 g/dL (ref 30.0–36.0)
MCV: 87.6 fL (ref 78.0–100.0)
MONO ABS: 0.4 10*3/uL (ref 0.1–1.0)
MONOS PCT: 6 %
NEUTROS ABS: 3.7 10*3/uL (ref 1.7–7.7)
Neutrophils Relative %: 58 %
PLATELETS: 156 10*3/uL (ref 150–400)
RBC: 4.83 MIL/uL (ref 4.22–5.81)
RDW: 13.4 % (ref 11.5–15.5)
WBC: 6.4 10*3/uL (ref 4.0–10.5)

## 2016-03-14 LAB — SURGICAL PCR SCREEN
MRSA, PCR: NEGATIVE
STAPHYLOCOCCUS AUREUS: NEGATIVE

## 2016-03-14 MED ORDER — CHLORHEXIDINE GLUCONATE 4 % EX LIQD: 60.0000 mL | Freq: Once | CUTANEOUS | Status: DC

## 2016-03-15 DIAGNOSIS — M25462 Effusion, left knee: Secondary | ICD-10-CM | POA: Diagnosis not present

## 2016-03-15 DIAGNOSIS — M25562 Pain in left knee: Secondary | ICD-10-CM | POA: Diagnosis not present

## 2016-03-15 DIAGNOSIS — G8929 Other chronic pain: Secondary | ICD-10-CM | POA: Diagnosis not present

## 2016-03-15 DIAGNOSIS — Z96652 Presence of left artificial knee joint: Secondary | ICD-10-CM | POA: Diagnosis not present

## 2016-03-15 DIAGNOSIS — R262 Difficulty in walking, not elsewhere classified: Secondary | ICD-10-CM | POA: Diagnosis not present

## 2016-03-15 DIAGNOSIS — M25662 Stiffness of left knee, not elsewhere classified: Secondary | ICD-10-CM | POA: Diagnosis not present

## 2016-03-20 DIAGNOSIS — M25662 Stiffness of left knee, not elsewhere classified: Secondary | ICD-10-CM | POA: Diagnosis not present

## 2016-03-20 DIAGNOSIS — M25562 Pain in left knee: Secondary | ICD-10-CM | POA: Diagnosis not present

## 2016-03-20 DIAGNOSIS — Z96652 Presence of left artificial knee joint: Secondary | ICD-10-CM | POA: Diagnosis not present

## 2016-03-20 DIAGNOSIS — G8929 Other chronic pain: Secondary | ICD-10-CM | POA: Diagnosis not present

## 2016-03-20 DIAGNOSIS — R262 Difficulty in walking, not elsewhere classified: Secondary | ICD-10-CM | POA: Diagnosis not present

## 2016-03-20 DIAGNOSIS — M25462 Effusion, left knee: Secondary | ICD-10-CM | POA: Diagnosis not present

## 2016-03-23 MED ORDER — TRANEXAMIC ACID 1000 MG/10ML IV SOLN
1000.0000 mg | INTRAVENOUS | Status: AC
  Administered 2016-03-26: 1000 mg via INTRAVENOUS
  Filled 2016-03-23: qty 10

## 2016-03-25 MED ORDER — GABAPENTIN 300 MG PO CAPS: 300.0000 mg | ORAL_CAPSULE | Freq: Once | ORAL | Status: DC

## 2016-03-25 MED ORDER — DEXAMETHASONE SODIUM PHOSPHATE 10 MG/ML IJ SOLN
8.0000 mg | Freq: Once | INTRAMUSCULAR | Status: AC
  Administered 2016-03-26: 8 mg via INTRAVENOUS
  Filled 2016-03-25: qty 1

## 2016-03-25 MED ORDER — BUPIVACAINE LIPOSOME 1.3 % IJ SUSP
20.0000 mL | INTRAMUSCULAR | Status: AC
  Administered 2016-03-26: 20 mL
  Filled 2016-03-25: qty 20

## 2016-03-25 MED ORDER — GABAPENTIN 300 MG PO CAPS
300.0000 mg | ORAL_CAPSULE | Freq: Once | ORAL | Status: AC
  Administered 2016-03-26: 300 mg via ORAL
  Filled 2016-03-25: qty 1

## 2016-03-25 MED ORDER — CEFAZOLIN SODIUM-DEXTROSE 2-4 GM/100ML-% IV SOLN
2.0000 g | INTRAVENOUS | Status: AC
  Administered 2016-03-26: 2 g via INTRAVENOUS
  Filled 2016-03-25: qty 100

## 2016-03-25 MED ORDER — ACETAMINOPHEN 500 MG PO TABS: 1000.0000 mg | ORAL_TABLET | Freq: Once | ORAL | Status: DC

## 2016-03-25 MED ORDER — ACETAMINOPHEN 500 MG PO TABS
1000.0000 mg | ORAL_TABLET | Freq: Once | ORAL | Status: AC
  Administered 2016-03-26: 1000 mg via ORAL
  Filled 2016-03-25: qty 2

## 2016-03-25 MED ORDER — DEXAMETHASONE SODIUM PHOSPHATE 10 MG/ML IJ SOLN: 8.0000 mg | Freq: Once | INTRAMUSCULAR | Status: DC

## 2016-03-25 NOTE — H&P (Signed)
Jason Robles MRN:  [id number] DOB/SEX:  12/03/1945/male  CHIEF COMPLAINT:  Painful right Knee  HISTORY: Patient is a 71 y.o. male presented with a history of pain in the right knee. Onset of symptoms was gradual starting a few years ago with gradually worsening course since that time. Patient has been treated conservatively with over-the-counter NSAIDs and activity modification. Patient currently rates pain in the knee at 10 out of 10 with activity. There is pain at night.  PAST MEDICAL HISTORY: Patient Active Problem List   Diagnosis Date Noted  . 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 Ganglion cyst of wrist, right 11/11/2015  . Paresthesias 11/04/2015  . Allergic conjunctivitis 08/16/2015  . Insomnia 08/16/2015  . Abscess of left hand 08/18/2012  . Medial epicondylitis of left elbow 06/25/2012  . Right knee pain 06/25/2012  . Syncope 05/02/2012  . Decreased muscle strength 03/26/2012  . TIA (transient ischemic attack) 06/28/2010  . Chest pain 06/28/2010  . Anxiety 06/28/2010  . Encounter for well adult exam with abnormal findings 06/28/2010  . Headache(784.0) 09/20/2009  . NONSPECIFIC ABN FINDING RAD & OTH EXAM GU ORGAN 05/03/2008  . PERIPHERAL VASCULAR DISEASE 08/25/2007  . ALLERGIC RHINITIS 08/25/2007  . EXTERNAL HEMORRHOIDS 04/18/2007  . Diverticulosis of colon (without mention of hemorrhage) 04/18/2007  . COLONIC POLYPS, HX OF 04/18/2007  . Hyperlipidemia 08/21/2006  . HEARING LOSS, 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  . Raynaud's syndrome 08/21/2006  . BENIGN PROSTATIC HYPERTROPHY 08/21/2006  . DEGENERATIVE JOINT DISEASE, KNEES, 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   Past Medical History:  Diagnosis Date  . ABDOMINAL PAIN OTHER SPECIFIED SITE 04/30/2008  . ALLERGIC RHINITIS 08/25/2007  . Anxiety 06/28/2010  . BENIGN PROSTATIC HYPERTROPHY 08/21/2006  . Chest pain 06/28/2010  . COLONIC POLYPS, HX OF 04/18/2007  . Complication of anesthesia   . DEGENERATIVE JOINT DISEASE, KNEES, BILATERAL  08/21/2006  . Diverticulosis of colon (without mention of hemorrhage) 04/18/2007  . EXTERNAL HEMORRHOIDS 04/18/2007  . Headache(784.0) 09/20/2009   hx migraines  . HEARING LOSS, 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  . HYPERLIPIDEMIA 08/21/2006  . INSOMNIA-SLEEP DISORDER-UNSPEC 09/20/2009  . NONSPECIFIC ABN FINDING RAD & OTH EXAM GU ORGAN 05/03/2008  . PERIPHERAL VASCULAR DISEASE 08/25/2007  . PONV (postoperative nausea and vomiting)   . Raynaud's syndrome 08/21/2006   Past Surgical History:  Procedure Laterality Date  . APPENDECTOMY    . s/p knee surgery Bilateral    x's 4 since 1980  . s/p left shoulder  2002   rotator cuff  . s/p lumbar disc surgery  2007  . 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   Procedure: TOTAL KNEE ARTHROPLASTY;  Surgeon: Vickey Huger, MD;  Location: [facility];  Service: Orthopedics;  Laterality: Left;     MEDICATIONS:   No prescriptions prior to admission.    ALLERGIES:   Allergies  Allergen Reactions  . Iohexol Itching    Itching post 125cc Omni injection. Treated w/ 50 mg PO benedryl.  Onset Date: [id number]     REVIEW OF SYSTEMS:  A comprehensive review of systems was negative except for: Musculoskeletal: positive for arthralgias and bone pain   FAMILY HISTORY:   Family History  Problem Relation Age of Onset  . Diabetes Mother   . Alzheimer's disease Mother   . Kidney cancer Brother   . Diabetes Maternal Uncle     SOCIAL HISTORY:   Social History  Substance Use Topics  . Smoking status: Former Smoker    Packs/day: 1.00  Years: 15.00    Types: Cigarettes    Quit date: 01/31/1980  . Smokeless tobacco: Never Used  . Alcohol use 1.8 oz/week    3 Cans of beer per week     EXAMINATION:  Vital signs in last 24 hours:    There were no vitals taken for this visit.  General Appearance:    Alert, cooperative, no distress, appears stated age  Head:    Normocephalic, without obvious abnormality, atraumatic  Eyes:     PERRL, conjunctiva/corneas clear, EOM's intact, fundi    benign, both eyes       Ears:    Normal TM's and external ear canals, both ears  Nose:   Nares normal, septum midline, mucosa normal, no drainage    or sinus tenderness  Throat:   Lips, mucosa, and tongue normal; teeth and gums normal  Neck:   Supple, symmetrical, trachea midline, no adenopathy;       thyroid:  No enlargement/tenderness/nodules; no carotid   bruit or JVD  Back:     Symmetric, no curvature, ROM normal, no CVA tenderness  Lungs:     Clear to auscultation bilaterally, respirations unlabored  Chest wall:    No tenderness or deformity  Heart:    Regular rate and rhythm, S1 and S2 normal, no murmur, rub   or gallop  Abdomen:     Soft, non-tender, bowel sounds active all four quadrants,    no masses, no organomegaly  Genitalia:    Normal male without lesion, discharge or tenderness  Rectal:    Normal tone, normal prostate, no masses or tenderness;   guaiac negative stool  Extremities:   Extremities normal, atraumatic, no cyanosis or edema  Pulses:   2+ and symmetric all extremities  Skin:   Skin color, texture, turgor normal, no rashes or lesions  Lymph nodes:   Cervical, supraclavicular, and axillary nodes normal  Neurologic:   CNII-XII intact. Normal strength, sensation and reflexes      throughout    Musculoskeletal:  ROM 0-120, Ligaments intact,  Imaging Review Plain radiographs demonstrate severe degenerative joint disease of the right knee. The overall alignment is mild varus. The bone quality appears to be excellent for age and reported activity level.  Assessment/Plan: Primary osteoarthritis, right knee   The patient history, physical examination and imaging studies are consistent with advanced degenerative joint disease of the right knee. The patient has failed conservative treatment.  The clearance notes were reviewed.  After discussion with the patient it was felt that Total Knee Replacement was indicated.  The procedure,  risks, and benefits of total knee arthroplasty were presented and reviewed. The risks including but not limited to aseptic loosening, infection, blood clots, vascular injury, stiffness, patella tracking problems complications among others were discussed. The patient acknowledged the explanation, agreed to proceed with the plan.  Jason Robles 03/25/2016, 9:07 PM

## 2016-03-26 ENCOUNTER — Ambulatory Visit (HOSPITAL_COMMUNITY): Payer: Medicare HMO | Admitting: Certified Registered Nurse Anesthetist

## 2016-03-26 ENCOUNTER — Encounter (HOSPITAL_COMMUNITY): Payer: Self-pay | Admitting: *Deleted

## 2016-03-26 ENCOUNTER — Observation Stay (HOSPITAL_COMMUNITY)
Admission: RE | Admit: 2016-03-26 | Discharge: 2016-03-27 | Disposition: A | Discharge status: Home / Self Care | Payer: Medicare HMO | Source: Ambulatory Visit | Attending: Orthopedic Surgery | Admitting: Orthopedic Surgery

## 2016-03-26 ENCOUNTER — Encounter (HOSPITAL_COMMUNITY)
Admission: RE | Disposition: A | Discharge status: Home / Self Care | Payer: Self-pay | Source: Ambulatory Visit | Attending: Orthopedic Surgery

## 2016-03-26 DIAGNOSIS — E785 Hyperlipidemia, unspecified: Secondary | ICD-10-CM | POA: Insufficient documentation

## 2016-03-26 DIAGNOSIS — I739 Peripheral vascular disease, unspecified: Secondary | ICD-10-CM | POA: Diagnosis not present

## 2016-03-26 DIAGNOSIS — Z96652 Presence of left artificial knee joint: Secondary | ICD-10-CM | POA: Diagnosis not present

## 2016-03-26 DIAGNOSIS — M1711 Unilateral primary osteoarthritis, right knee: Secondary | ICD-10-CM | POA: Diagnosis not present

## 2016-03-26 DIAGNOSIS — M170 Bilateral primary osteoarthritis of knee: Secondary | ICD-10-CM | POA: Diagnosis not present

## 2016-03-26 DIAGNOSIS — Z87891 Personal history of nicotine dependence: Secondary | ICD-10-CM | POA: Insufficient documentation

## 2016-03-26 DIAGNOSIS — Z96659 Presence of unspecified artificial knee joint: Secondary | ICD-10-CM

## 2016-03-26 DIAGNOSIS — G8918 Other acute postprocedural pain: Secondary | ICD-10-CM | POA: Diagnosis not present

## 2016-03-26 DIAGNOSIS — Z79899 Other long term (current) drug therapy: Secondary | ICD-10-CM | POA: Diagnosis not present

## 2016-03-26 HISTORY — PX: TOTAL KNEE ARTHROPLASTY: SHX125

## 2016-03-26 SURGERY — ARTHROPLASTY, KNEE, TOTAL
Anesthesia: Monitor Anesthesia Care | Site: Knee | Laterality: Right

## 2016-03-26 MED ORDER — CEFAZOLIN IN D5W 1 GM/50ML IV SOLN
1.0000 g | Freq: Four times a day (QID) | INTRAVENOUS | Status: AC
  Administered 2016-03-26 (×2): 1 g via INTRAVENOUS
  Filled 2016-03-26 (×2): qty 50

## 2016-03-26 MED ORDER — DEXTROSE 5 % IV SOLN: 500.0000 mg | Freq: Four times a day (QID) | INTRAVENOUS | Status: DC | PRN

## 2016-03-26 MED ORDER — MENTHOL 3 MG MT LOZG: 1.0000 | LOZENGE | OROMUCOSAL | Status: DC | PRN

## 2016-03-26 MED ORDER — TRANEXAMIC ACID 1000 MG/10ML IV SOLN
2000.0000 mg | INTRAVENOUS | Status: AC
  Administered 2016-03-26: 2000 mg via TOPICAL
  Filled 2016-03-26: qty 20

## 2016-03-26 MED ORDER — EPINEPHRINE PF 1 MG/ML IJ SOLN
INTRAMUSCULAR | Status: AC
  Filled 2016-03-26: qty 1

## 2016-03-26 MED ORDER — ZOLPIDEM TARTRATE 5 MG PO TABS: 5.0000 mg | ORAL_TABLET | Freq: Every evening | ORAL | Status: DC | PRN

## 2016-03-26 MED ORDER — SODIUM CHLORIDE 0.9 % IV SOLN
1000.0000 mg | Freq: Once | INTRAVENOUS | Status: AC
  Administered 2016-03-26: 1000 mg via INTRAVENOUS
  Filled 2016-03-26: qty 10

## 2016-03-26 MED ORDER — SODIUM CHLORIDE 0.9 % IV SOLN: INTRAVENOUS | Status: DC

## 2016-03-26 MED ORDER — LACTATED RINGERS IV SOLN
INTRAVENOUS | Status: DC | PRN
  Administered 2016-03-26 (×2): via INTRAVENOUS

## 2016-03-26 MED ORDER — PROPOFOL 500 MG/50ML IV EMUL
INTRAVENOUS | Status: DC | PRN
  Administered 2016-03-26: 75 ug/kg/min via INTRAVENOUS

## 2016-03-26 MED ORDER — FENTANYL CITRATE (PF) 100 MCG/2ML IJ SOLN
INTRAMUSCULAR | Status: DC | PRN
  Administered 2016-03-26: 50 ug via INTRAVENOUS

## 2016-03-26 MED ORDER — SENNOSIDES-DOCUSATE SODIUM 8.6-50 MG PO TABS: 1.0000 | ORAL_TABLET | Freq: Every evening | ORAL | Status: DC | PRN

## 2016-03-26 MED ORDER — ACETAMINOPHEN 325 MG PO TABS: 650.0000 mg | ORAL_TABLET | Freq: Four times a day (QID) | ORAL | Status: DC | PRN

## 2016-03-26 MED ORDER — FENTANYL CITRATE (PF) 100 MCG/2ML IJ SOLN
INTRAMUSCULAR | Status: AC
  Filled 2016-03-26: qty 2

## 2016-03-26 MED ORDER — CELECOXIB 200 MG PO CAPS
200.0000 mg | ORAL_CAPSULE | Freq: Two times a day (BID) | ORAL | Status: DC
  Administered 2016-03-26 – 2016-03-27 (×3): 200 mg via ORAL
  Filled 2016-03-26 (×3): qty 1

## 2016-03-26 MED ORDER — METHOCARBAMOL 500 MG PO TABS
500.0000 mg | ORAL_TABLET | Freq: Four times a day (QID) | ORAL | Status: DC | PRN
  Administered 2016-03-26 – 2016-03-27 (×2): 500 mg via ORAL
  Filled 2016-03-26 (×2): qty 1

## 2016-03-26 MED ORDER — FLEET ENEMA 7-19 GM/118ML RE ENEM: 1.0000 | ENEMA | Freq: Once | RECTAL | Status: DC | PRN

## 2016-03-26 MED ORDER — ALUM & MAG HYDROXIDE-SIMETH 200-200-20 MG/5ML PO SUSP: 30.0000 mL | ORAL | Status: DC | PRN

## 2016-03-26 MED ORDER — OXYCODONE HCL 5 MG PO TABS
5.0000 mg | ORAL_TABLET | ORAL | Status: DC | PRN
  Administered 2016-03-26 – 2016-03-27 (×2): 10 mg via ORAL
  Filled 2016-03-26 (×2): qty 2

## 2016-03-26 MED ORDER — PROPOFOL 10 MG/ML IV BOLUS
INTRAVENOUS | Status: AC
  Filled 2016-03-26: qty 20

## 2016-03-26 MED ORDER — BISACODYL 5 MG PO TBEC: 5.0000 mg | DELAYED_RELEASE_TABLET | Freq: Every day | ORAL | Status: DC | PRN

## 2016-03-26 MED ORDER — ACETAMINOPHEN 650 MG RE SUPP: 650.0000 mg | Freq: Four times a day (QID) | RECTAL | Status: DC | PRN

## 2016-03-26 MED ORDER — ONDANSETRON HCL 4 MG PO TABS: 4.0000 mg | ORAL_TABLET | Freq: Four times a day (QID) | ORAL | Status: DC | PRN

## 2016-03-26 MED ORDER — METOCLOPRAMIDE HCL 5 MG/ML IJ SOLN: 5.0000 mg | Freq: Three times a day (TID) | INTRAMUSCULAR | Status: DC | PRN

## 2016-03-26 MED ORDER — DEXAMETHASONE SODIUM PHOSPHATE 10 MG/ML IJ SOLN
10.0000 mg | Freq: Once | INTRAMUSCULAR | Status: AC
  Administered 2016-03-27: 10 mg via INTRAVENOUS
  Filled 2016-03-26: qty 1

## 2016-03-26 MED ORDER — DIPHENHYDRAMINE HCL 12.5 MG/5ML PO ELIX: 12.5000 mg | ORAL_SOLUTION | ORAL | Status: DC | PRN

## 2016-03-26 MED ORDER — ONDANSETRON HCL 4 MG/2ML IJ SOLN: 4.0000 mg | Freq: Four times a day (QID) | INTRAMUSCULAR | Status: DC | PRN

## 2016-03-26 MED ORDER — BUPIVACAINE-EPINEPHRINE (PF) 0.25% -1:200000 IJ SOLN
INTRAMUSCULAR | Status: DC | PRN
  Administered 2016-03-26: 20 mL

## 2016-03-26 MED ORDER — ONDANSETRON HCL 4 MG/2ML IJ SOLN: 4.0000 mg | Freq: Once | INTRAMUSCULAR | Status: DC | PRN

## 2016-03-26 MED ORDER — METOCLOPRAMIDE HCL 5 MG PO TABS: 5.0000 mg | ORAL_TABLET | Freq: Three times a day (TID) | ORAL | Status: DC | PRN

## 2016-03-26 MED ORDER — HYDROCODONE-ACETAMINOPHEN 7.5-325 MG PO TABS
1.0000 | ORAL_TABLET | Freq: Four times a day (QID) | ORAL | Status: DC
  Administered 2016-03-26 – 2016-03-27 (×4): 1 via ORAL
  Filled 2016-03-26 (×4): qty 1

## 2016-03-26 MED ORDER — LIDOCAINE 2% (20 MG/ML) 5 ML SYRINGE
INTRAMUSCULAR | Status: DC | PRN
  Administered 2016-03-26: 50 mg via INTRAVENOUS

## 2016-03-26 MED ORDER — ROPIVACAINE HCL 7.5 MG/ML IJ SOLN
INTRAMUSCULAR | Status: DC | PRN
  Administered 2016-03-26: 20 mL via PERINEURAL

## 2016-03-26 MED ORDER — HYDROMORPHONE HCL 2 MG/ML IJ SOLN
1.0000 mg | INTRAMUSCULAR | Status: DC | PRN
  Administered 2016-03-26: 1 mg via INTRAVENOUS
  Filled 2016-03-26: qty 1

## 2016-03-26 MED ORDER — ATORVASTATIN CALCIUM 10 MG PO TABS
10.0000 mg | ORAL_TABLET | Freq: Every day | ORAL | Status: DC
  Administered 2016-03-26 – 2016-03-27 (×2): 10 mg via ORAL
  Filled 2016-03-26 (×2): qty 1

## 2016-03-26 MED ORDER — HYDROMORPHONE HCL 1 MG/ML IJ SOLN: 0.2500 mg | INTRAMUSCULAR | Status: DC | PRN

## 2016-03-26 MED ORDER — SODIUM CHLORIDE 0.9 % IR SOLN
Status: DC | PRN
  Administered 2016-03-26: 1000 mL

## 2016-03-26 MED ORDER — GABAPENTIN 300 MG PO CAPS
300.0000 mg | ORAL_CAPSULE | Freq: Three times a day (TID) | ORAL | Status: DC
  Administered 2016-03-26 – 2016-03-27 (×4): 300 mg via ORAL
  Filled 2016-03-26 (×4): qty 1

## 2016-03-26 MED ORDER — MIDAZOLAM HCL 2 MG/2ML IJ SOLN
INTRAMUSCULAR | Status: DC | PRN
  Administered 2016-03-26: 2 mg via INTRAVENOUS

## 2016-03-26 MED ORDER — PHENOL 1.4 % MT LIQD: 1.0000 | OROMUCOSAL | Status: DC | PRN

## 2016-03-26 MED ORDER — SODIUM CHLORIDE 0.9 % IJ SOLN
INTRAMUSCULAR | Status: DC | PRN
  Administered 2016-03-26: 20 mL

## 2016-03-26 MED ORDER — PHENYLEPHRINE HCL 10 MG/ML IJ SOLN
INTRAMUSCULAR | Status: DC | PRN
  Administered 2016-03-26: 40 ug via INTRAVENOUS
  Administered 2016-03-26: 80 ug via INTRAVENOUS

## 2016-03-26 MED ORDER — ASPIRIN EC 325 MG PO TBEC
325.0000 mg | DELAYED_RELEASE_TABLET | Freq: Two times a day (BID) | ORAL | Status: DC
  Administered 2016-03-26 – 2016-03-27 (×3): 325 mg via ORAL
  Filled 2016-03-26 (×3): qty 1

## 2016-03-26 MED ORDER — MIDAZOLAM HCL 2 MG/2ML IJ SOLN
INTRAMUSCULAR | Status: AC
  Filled 2016-03-26: qty 2

## 2016-03-26 MED ORDER — BUPIVACAINE HCL (PF) 0.25 % IJ SOLN
INTRAMUSCULAR | Status: AC
  Filled 2016-03-26: qty 30

## 2016-03-26 MED ORDER — MEPERIDINE HCL 25 MG/ML IJ SOLN: 6.2500 mg | INTRAMUSCULAR | Status: DC | PRN

## 2016-03-26 MED ORDER — DOCUSATE SODIUM 100 MG PO CAPS
100.0000 mg | ORAL_CAPSULE | Freq: Two times a day (BID) | ORAL | Status: DC
  Administered 2016-03-26 – 2016-03-27 (×3): 100 mg via ORAL
  Filled 2016-03-26 (×3): qty 1

## 2016-03-26 MED ORDER — BUPIVACAINE IN DEXTROSE 0.75-8.25 % IT SOLN
INTRATHECAL | Status: DC | PRN
  Administered 2016-03-26: 2 mL via INTRATHECAL

## 2016-03-26 MED ORDER — PROPOFOL 10 MG/ML IV BOLUS
INTRAVENOUS | Status: DC | PRN
  Administered 2016-03-26 (×2): 20 mg via INTRAVENOUS

## 2016-03-26 SURGICAL SUPPLY — 61 items
BANDAGE ACE 6X5 VEL STRL LF (GAUZE/BANDAGES/DRESSINGS) ×2 IMPLANT
BANDAGE ESMARK 6X9 LF (GAUZE/BANDAGES/DRESSINGS) ×1 IMPLANT
BLADE SAGITTAL 13X1.27X60 (BLADE) ×2 IMPLANT
BLADE SAW SGTL 83.5X18.5 (BLADE) ×1 IMPLANT
BLADE SURG 10 STRL SS (BLADE) ×3 IMPLANT
BNDG CMPR 9X6 STRL LF SNTH (GAUZE/BANDAGES/DRESSINGS) ×1
BNDG ESMARK 6X9 LF (GAUZE/BANDAGES/DRESSINGS) ×2
BOWL SMART MIX CTS (DISPOSABLE) ×2 IMPLANT
CAPT KNEE TOTAL 3 ×2 IMPLANT
CEMENT BONE SIMPLEX SPEEDSET (Cement) ×4 IMPLANT
COVER SURGICAL LIGHT HANDLE (MISCELLANEOUS) ×2 IMPLANT
CUFF TOURNIQUET SINGLE 34IN LL (TOURNIQUET CUFF) ×2 IMPLANT
DRAPE EXTREMITY T 121X128X90 (DRAPE) ×2 IMPLANT
DRAPE HALF SHEET 40X57 (DRAPES) ×2 IMPLANT
DRAPE INCISE IOBAN 66X45 STRL (DRAPES) ×4 IMPLANT
DRAPE U-SHAPE 47X51 STRL (DRAPES) ×2 IMPLANT
DRSG AQUACEL AG ADV 3.5X10 (GAUZE/BANDAGES/DRESSINGS) ×2 IMPLANT
DURAPREP 26ML APPLICATOR (WOUND CARE) ×4 IMPLANT
ELECT REM PT RETURN 9FT ADLT (ELECTROSURGICAL) ×2
ELECTRODE REM PT RTRN 9FT ADLT (ELECTROSURGICAL) ×1 IMPLANT
FILTER STRAW FLUID ASPIR (MISCELLANEOUS) IMPLANT
GLOVE BIOGEL M 7.0 STRL (GLOVE) IMPLANT
GLOVE BIOGEL PI IND STRL 7.5 (GLOVE) IMPLANT
GLOVE BIOGEL PI IND STRL 8.5 (GLOVE) ×1 IMPLANT
GLOVE BIOGEL PI INDICATOR 7.5 (GLOVE)
GLOVE BIOGEL PI INDICATOR 8.5 (GLOVE) ×1
GLOVE SURG ORTHO 8.0 STRL STRW (GLOVE) ×4 IMPLANT
GOWN STRL REUS W/ TWL LRG LVL3 (GOWN DISPOSABLE) ×1 IMPLANT
GOWN STRL REUS W/ TWL XL LVL3 (GOWN DISPOSABLE) ×2 IMPLANT
GOWN STRL REUS W/TWL 2XL LVL3 (GOWN DISPOSABLE) ×2 IMPLANT
GOWN STRL REUS W/TWL LRG LVL3 (GOWN DISPOSABLE) ×2
GOWN STRL REUS W/TWL XL LVL3 (GOWN DISPOSABLE) ×4
HANDPIECE INTERPULSE COAX TIP (DISPOSABLE) ×2
HOOD PEEL AWAY FACE SHEILD DIS (HOOD) ×6 IMPLANT
KIT BASIN OR (CUSTOM PROCEDURE TRAY) ×2 IMPLANT
KIT ROOM TURNOVER OR (KITS) ×2 IMPLANT
KNEE CAPITATED TOTAL 3 IMPLANT
MANIFOLD NEPTUNE II (INSTRUMENTS) ×2 IMPLANT
NDL 18GX1X1/2 (RX/OR ONLY) (NEEDLE) IMPLANT
NEEDLE 18GX1X1/2 (RX/OR ONLY) (NEEDLE) IMPLANT
NEEDLE 22X1 1/2 (OR ONLY) (NEEDLE) ×4 IMPLANT
NS IRRIG 1000ML POUR BTL (IV SOLUTION) ×2 IMPLANT
PACK TOTAL JOINT (CUSTOM PROCEDURE TRAY) ×2 IMPLANT
PAD ARMBOARD 7.5X6 YLW CONV (MISCELLANEOUS) ×4 IMPLANT
SET HNDPC FAN SPRY TIP SCT (DISPOSABLE) ×1 IMPLANT
STRIP CLOSURE SKIN 1/2X4 (GAUZE/BANDAGES/DRESSINGS) ×2 IMPLANT
SUCTION FRAZIER HANDLE 10FR (MISCELLANEOUS)
SUCTION TUBE FRAZIER 10FR DISP (MISCELLANEOUS) IMPLANT
SUT MNCRL AB 3-0 PS2 18 (SUTURE) ×2 IMPLANT
SUT VIC AB 0 CTB1 27 (SUTURE) ×3 IMPLANT
SUT VIC AB 1 CT1 27 (SUTURE) ×4
SUT VIC AB 1 CT1 27XBRD ANBCTR (SUTURE) ×2 IMPLANT
SUT VIC AB 2-0 CT1 27 (SUTURE) ×4
SUT VIC AB 2-0 CT1 TAPERPNT 27 (SUTURE) ×2 IMPLANT
SYR 20CC LL (SYRINGE) ×4 IMPLANT
SYR TB 1ML LUER SLIP (SYRINGE) IMPLANT
TOWEL OR 17X24 6PK STRL BLUE (TOWEL DISPOSABLE) ×1 IMPLANT
TOWEL OR 17X26 10 PK STRL BLUE (TOWEL DISPOSABLE) ×1 IMPLANT
TRAY CATH 16FR W/PLASTIC CATH (SET/KITS/TRAYS/PACK) IMPLANT
TRAY FOLEY BAG SILVER LF 14FR (SET/KITS/TRAYS/PACK) ×1 IMPLANT
WRAP KNEE MAXI GEL POST OP (GAUZE/BANDAGES/DRESSINGS) ×2 IMPLANT

## 2016-03-26 NOTE — Anesthesia Procedure Notes (Signed)
Spinal  Patient location during procedure: OR Start time: 03/26/2016 7:28 AM End time: 03/26/2016 7:30 AM Staffing Anesthesiologist: Lillia Abed Performed: anesthesiologist  Preanesthetic Checklist Completed: patient identified, surgical consent, pre-op evaluation, timeout performed, IV checked, risks and benefits discussed and monitors and equipment checked Spinal Block Patient position: sitting Prep: DuraPrep Patient monitoring: heart rate, cardiac monitor, continuous pulse ox and blood pressure Approach: right paramedian Location: L3-4 Injection technique: single-shot Needle Needle type: Pencan  Needle gauge: 24 G

## 2016-03-26 NOTE — Transfer of Care (Signed)
Immediate Anesthesia Transfer of Care Note  Patient: Jason Robles  Procedure(s) Performed: Procedure(s): RIGHT TOTAL KNEE ARTHROPLASTY (Right)  Patient Location: PACU  Anesthesia Type:Spinal and MAC combined with regional for post-op pain  Level of Consciousness: awake, alert  and oriented  Airway & Oxygen Therapy: Patient Spontanous Breathing  Post-op Assessment: Report given to RN and Post -op Vital signs reviewed and stable  Post vital signs: Reviewed and stable  Last Vitals:  Vitals:   03/26/16 0622  BP: 133/78  Pulse: 67  Resp: 20  Temp: 36.7 C    Last Pain:  Vitals:   03/26/16 0622  TempSrc: Oral      Patients Stated Pain Goal: 5 (44/01/02 7253)  Complications: No apparent anesthesia complications

## 2016-03-26 NOTE — Anesthesia Preprocedure Evaluation (Signed)
Anesthesia Evaluation  Patient identified by MRN, date of birth, ID band Patient awake    Reviewed: Allergy & Precautions, NPO status , Patient's Chart, lab work & pertinent test results  History of Anesthesia Complications (+) PONV  Airway Mallampati: I  TM Distance: >3 FB Neck ROM: Full    Dental   Pulmonary former smoker,    Pulmonary exam normal        Cardiovascular Normal cardiovascular exam     Neuro/Psych Anxiety    GI/Hepatic   Endo/Other    Renal/GU      Musculoskeletal   Abdominal   Peds  Hematology   Anesthesia Other Findings   Reproductive/Obstetrics                             Anesthesia Physical Anesthesia Plan  ASA: II  Anesthesia Plan: Spinal and MAC   Post-op Pain Management:    Induction: Intravenous  Airway Management Planned: Simple Face Mask  Additional Equipment:   Intra-op Plan:   Post-operative Plan:   Informed Consent: I have reviewed the patients History and Physical, chart, labs and discussed the procedure including the risks, benefits and alternatives for the proposed anesthesia with the patient or authorized representative who has indicated his/her understanding and acceptance.     Plan Discussed with: CRNA and Surgeon  Anesthesia Plan Comments:         Anesthesia Quick Evaluation

## 2016-03-26 NOTE — Evaluation (Signed)
Physical Therapy Evaluation Patient Details Name: Jason Robles MRN: [id number] DOB: [birth year] Today's Date: 03/26/2016   History of Present Illness  Patient is a 71 y.o. male presented with a history of pain in the right knee.  Right TKA 03/26/16.  Left TKA recently in Feb.  PMH: TIA, CP and anxiety as well as Raynaud's.    Clinical Impression  Pt admitted with above diagnosis. Pt currently with functional limitations due to the deficits listed below (see PT Problem List). Pt was able to ambulate with min guard assist with RW.  Also performed all exercises.  Should do well and be able to go home in am after practicing steps.  Will follow acutely.  Pt will benefit from skilled PT to increase their independence and safety with mobility to allow discharge to the venue listed below.      Follow Up Recommendations Outpatient PT;Supervision - Intermittent    Equipment Recommendations  None recommended by PT    Recommendations for Other Services       Precautions / Restrictions Precautions Precautions: Fall;Knee Precaution Booklet Issued: Yes (comment) Restrictions Weight Bearing Restrictions: Yes RLE Weight Bearing: Weight bearing as tolerated      Mobility  Bed Mobility Overal bed mobility: Needs Assistance Bed Mobility: Supine to Sit     Supine to sit: Min assist     General bed mobility comments: Alittle assist for initiation of movement only.   Transfers Overall transfer level: Needs assistance Equipment used: Rolling walker (2 wheeled) Transfers: Sit to/from Stand Sit to Stand: Min guard         General transfer comment: Cues for hand placement only.   Ambulation/Gait Ambulation/Gait assistance: Min guard;+2 safety/equipment Ambulation Distance (Feet): 300 Feet Assistive device: Rolling walker (2 wheeled) Gait Pattern/deviations: Step-through pattern;Decreased stance time - right;Decreased weight shift to right;Antalgic   Gait velocity interpretation: <1.8  ft/sec, indicative of risk for recurrent falls General Gait Details: Pt needed occasional cues to slow down with good sequencing overall with RW.  Wife followed pt with chair however pt did not need to sit and rest.  Pt did welloverall with ambulation.   Stairs            Wheelchair Mobility    Modified Rankin (Stroke Patients Only)       Balance Overall balance assessment: Needs assistance Sitting-balance support: No upper extremity supported;Feet supported Sitting balance-Leahy Scale: Good     Standing balance support: Bilateral upper extremity supported;During functional activity Standing balance-Leahy Scale: Poor Standing balance comment: relies on RW but not heavily reliant.             High level balance activites: Direction changes;Turns;Sudden stops High Level Balance Comments: min guard assist with turns with RW.              Pertinent Vitals/Pain Pain Assessment: 0-10 Pain Score: 5  Pain Location: right knee Pain Descriptors / Indicators: Aching;Grimacing;Guarding Pain Intervention(s): Limited activity within patient's tolerance;Monitored during session;Premedicated before session;Repositioned    Home Living Family/patient expects to be discharged to:: Private residence Living Arrangements: Spouse/significant other Available Help at Discharge: Family;Available 24 hours/day Type of Home: House Home Access: Stairs to enter Entrance Stairs-Rails: None Entrance Stairs-Number of Steps: 3 Home Layout: Two level Home Equipment: Walker - 2 wheels;Bedside commode;Other (comment) (CPM)      Prior Function Level of Independence: Independent         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)     Hand Dominance   Dominant  Hand: Right    Extremity/Trunk Assessment   Upper Extremity Assessment Upper Extremity Assessment: Defer to OT evaluation    Lower Extremity Assessment Lower Extremity Assessment: RLE deficits/detail RLE Deficits /  Details: grossly 3/5 RLE Sensation: decreased light touch (due to nerve block)    Cervical / Trunk Assessment Cervical / Trunk Assessment: Normal  Communication   Communication: HOH  Cognition Arousal/Alertness: Awake/alert Behavior During Therapy: WFL for tasks assessed/performed Overall Cognitive Status: Within Functional Limits for tasks assessed                                        General Comments      Exercises Total Joint Exercises Ankle Circles/Pumps: AROM;Both;10 reps;Supine Quad Sets: AROM;Both;10 reps;Supine Towel Squeeze: AROM;Both;10 reps;Supine Heel Slides: AROM;Both;10 reps;Supine Hip ABduction/ADduction: AROM;Both;10 reps;Supine Long Arc Quad: AROM;Both;10 reps;Seated Knee Flexion: AROM;Both;10 reps;Supine Goniometric ROM: 5-110 degrees   Assessment/Plan    PT Assessment Patient needs continued PT services  PT Problem List Decreased activity tolerance;Decreased balance;Decreased mobility;Decreased strength;Decreased range of motion;Decreased knowledge of use of DME;Decreased safety awareness;Decreased knowledge of precautions;Pain       PT Treatment Interventions DME instruction;Gait training;Stair training;Functional mobility training;Therapeutic activities;Therapeutic exercise;Balance training;Patient/family education    PT Goals (Current goals can be found in the Care Plan section)  Acute Rehab PT Goals Patient Stated Goal: to go home PT Goal Formulation: With patient Time For Goal Achievement: 04/02/16 Potential to Achieve Goals: Good    Frequency 7X/week   Barriers to discharge        Co-evaluation               End of Session Equipment Utilized During Treatment: Gait belt Activity Tolerance: Patient tolerated treatment well Patient left: in chair;with call bell/phone within reach;with chair alarm set;with family/visitor present Nurse Communication: Mobility status;Weight bearing status PT Visit Diagnosis:  Unsteadiness on feet (R26.81);Muscle weakness (generalized) (M62.81);Pain Pain - Right/Left: Right Pain - part of body: Knee    Time: 1340-1448 PT Time Calculation (min) (ACUTE ONLY): 68 min   Charges:   PT Evaluation $PT Eval Moderate Complexity: 1 Procedure PT Treatments $Gait Training: 23-37 mins $Therapeutic Exercise: 8-22 mins $Self Care/Home Management: 8-22   PT G Codes:   PT G-Codes **NOT FOR INPATIENT CLASS** Functional Assessment Tool Used: AM-PAC 6 Clicks Basic Mobility Functional Limitation: Mobility: Walking and moving around Mobility: Walking and Moving Around Current Status (Z6109): At least 20 percent but less than 40 percent impaired, limited or restricted Mobility: Walking and Moving Around Goal Status [phone]): At least 1 percent but less than 20 percent impaired, limited or restricted    [place] [phone] (pager)   Denice Paradise 03/26/2016, 3:30 PM

## 2016-03-26 NOTE — Anesthesia Procedure Notes (Signed)
Procedure Name: MAC Performed by: Lillia Abed Pre-anesthesia Checklist: Patient identified, Emergency Drugs available, Suction available, Patient being monitored and Timeout performed Patient Re-evaluated:Patient Re-evaluated prior to inductionOxygen Delivery Method: Simple face mask Placement Confirmation: positive ETCO2 Dental Injury: Teeth and Oropharynx as per pre-operative assessment

## 2016-03-26 NOTE — Progress Notes (Signed)
Orthopedic Tech Progress Note Patient Details:  Jason Robles 03-04-1945 [id number]  CPM Right Knee CPM Right Knee: On Right Knee Flexion (Degrees): 90 Right Knee Extension (Degrees): 0 Additional Comments: Trapeze bar and foot roll   [name] 03/26/2016, 10:12 AM

## 2016-03-26 NOTE — Anesthesia Procedure Notes (Signed)
Anesthesia Regional Block: Adductor canal block   Pre-Anesthetic Checklist: ,, timeout performed, Correct Patient, Correct Site, Correct Laterality, Correct Procedure, Correct Position, site marked, Risks and benefits discussed,  Surgical consent,  Pre-op evaluation,  At surgeon's request and post-op pain management  Laterality: Right  Prep: chloraprep       Needles:  Injection technique: Single-shot  Needle Type: Echogenic Stimulator Needle     Needle Length: 9cm  Needle Gauge: 21     Additional Needles:   Procedures: ultrasound guided,,,,,,,,  Narrative:  Start time: 03/26/2016 7:05 AM End time: 03/26/2016 7:15 AM Injection made incrementally with aspirations every 5 mL.  Performed by: Personally  Anesthesiologist: Lillia Abed  Additional Notes: Monitors applied. Patient sedated. Sterile prep and drape,hand hygiene and sterile gloves were used. Relevant anatomy identified.Needle position confirmed.Local anesthetic injected incrementally after negative aspiration. Local anesthetic spread visualized around nerve(s). Vascular puncture avoided. No complications. Image printed for medical record.The patient tolerated the procedure well.    Lillia Abed MD

## 2016-03-26 NOTE — Anesthesia Postprocedure Evaluation (Signed)
Anesthesia Post Note  Patient: Jason Robles  Procedure(s) Performed: Procedure(s) (LRB): RIGHT TOTAL KNEE ARTHROPLASTY (Right)  Patient location during evaluation: PACU Anesthesia Type: Spinal Level of consciousness: oriented and awake and alert Pain management: pain level controlled Vital Signs Assessment: post-procedure vital signs reviewed and stable Respiratory status: spontaneous breathing, respiratory function stable and patient connected to nasal cannula oxygen Cardiovascular status: blood pressure returned to baseline and stable Postop Assessment: no headache and no backache Anesthetic complications: no       Last Vitals:  Vitals:   03/26/16 1040 03/26/16 1143  BP:  (!) 143/82  Pulse:  61  Resp:  18  Temp: 36.1 C 36.9 C    Last Pain:  Vitals:   03/26/16 1143  TempSrc: Oral  PainSc:                  Dehlia Kilner DAVID

## 2016-03-27 ENCOUNTER — Encounter (HOSPITAL_COMMUNITY): Payer: Self-pay | Admitting: Orthopedic Surgery

## 2016-03-27 ENCOUNTER — Telehealth: Payer: Self-pay | Admitting: *Deleted

## 2016-03-27 DIAGNOSIS — M1711 Unilateral primary osteoarthritis, right knee: Secondary | ICD-10-CM | POA: Diagnosis not present

## 2016-03-27 DIAGNOSIS — Z96651 Presence of right artificial knee joint: Secondary | ICD-10-CM | POA: Diagnosis not present

## 2016-03-27 LAB — CBC
HCT: 34.8 % — ABNORMAL LOW (ref 39.0–52.0)
Hemoglobin: 11.4 g/dL — ABNORMAL LOW (ref 13.0–17.0)
MCH: 28 pg (ref 26.0–34.0)
MCHC: 32.8 g/dL (ref 30.0–36.0)
MCV: 85.5 fL (ref 78.0–100.0)
PLATELETS: 164 10*3/uL (ref 150–400)
RBC: 4.07 MIL/uL — AB (ref 4.22–5.81)
RDW: 13.1 % (ref 11.5–15.5)
WBC: 7.8 10*3/uL (ref 4.0–10.5)

## 2016-03-27 LAB — BASIC METABOLIC PANEL
ANION GAP: 7 (ref 5–15)
BUN: 13 mg/dL (ref 6–20)
CO2: 29 mmol/L (ref 22–32)
Calcium: 9 mg/dL (ref 8.9–10.3)
Chloride: 101 mmol/L (ref 101–111)
Creatinine, Ser: 0.83 mg/dL (ref 0.61–1.24)
GLUCOSE: 107 mg/dL — AB (ref 65–99)
POTASSIUM: 4 mmol/L (ref 3.5–5.1)
SODIUM: 137 mmol/L (ref 135–145)

## 2016-03-27 MED ORDER — OXYCODONE HCL 10 MG PO TABS: 10.0000 mg | ORAL_TABLET | ORAL | 0 refills | Status: DC | PRN

## 2016-03-27 MED ORDER — METHOCARBAMOL 500 MG PO TABS: 500.0000 mg | ORAL_TABLET | Freq: Four times a day (QID) | ORAL | 0 refills | Status: DC | PRN

## 2016-03-27 MED ORDER — ASPIRIN 325 MG PO TBEC: 325.0000 mg | DELAYED_RELEASE_TABLET | Freq: Two times a day (BID) | ORAL | 0 refills | Status: DC

## 2016-03-27 NOTE — Op Note (Signed)
TOTAL KNEE REPLACEMENT OPERATIVE NOTE:  03/26/2016  8:51 AM  PATIENT:  Jason Robles  71 y.o. male  PRE-OPERATIVE DIAGNOSIS:  primary osteoarthritis right knee  POST-OPERATIVE DIAGNOSIS:  primary osterarthritis right knee  PROCEDURE:  Procedure(s): RIGHT TOTAL KNEE ARTHROPLASTY  SURGEON:  Surgeon(s): Vickey Huger, MD  PHYSICIAN ASSISTANT: Carlyon Shadow, [facility]  ANESTHESIA:   spinal  DRAINS: Hemovac  SPECIMEN: None  COUNTS:  Correct  TOURNIQUET:   Total Tourniquet Time Documented: Thigh (Right) - 52 minutes Total: Thigh (Right) - 52 minutes   DICTATION:  Indication for procedure:    The patient is a 71 y.o. male who has failed conservative treatment for primary osteoarthritis right knee.  Informed consent was obtained prior to anesthesia. The risks versus benefits of the operation were explain and in a way the patient can, and did, understand.   On the implant demand matching protocol, this patient scored 10.  Therefore, this patient was not receive a polyethylene insert with vitamin E which is a high demand implant.  Description of procedure:     The patient was taken to the operating room and placed under anesthesia.  The patient was positioned in the usual fashion taking care that all body parts were adequately padded and/or protected.  I foley catheter was not placed.  A tourniquet was applied and the leg prepped and draped in the usual sterile fashion.  The extremity was exsanguinated with the esmarch and tourniquet inflated to 350 mmHg.  Pre-operative range of motion was normal.  The knee was in 5 degree of mild varus.  A midline incision approximately 6-7 inches long was made with a #10 blade.  A new blade was used to make a parapatellar arthrotomy going 2-3 cm into the quadriceps tendon, over the patella, and alongside the medial aspect of the patellar tendon.  A synovectomy was then performed with the #10 blade and forceps. I then elevated the deep MCL off the  medial tibial metaphysis subperiosteally around to the semimembranosus attachment.    I everted the patella and used calipers to measure patellar thickness.  I used the reamer to ream down to appropriate thickness to recreate the native thickness.  I then removed excess bone with the rongeur and sagittal saw.  I used the appropriately sized template and drilled the three lug holes.  I then put the trial in place and measured the thickness with the calipers to ensure recreation of the native thickness.  The trial was then removed and the patella subluxed and the knee brought into flexion.  A homan retractor was place to retract and protect the patella and lateral structures.  A Z-retractor was place medially to protect the medial structures.  The extra-medullary alignment system was used to make cut the tibial articular surface perpendicular to the anamotic axis of the tibia and in 3 degrees of posterior slope.  The cut surface and alignment jig was removed.  I then used the intramedullary alignment guide to make a 11 valgus cut on the distal femur.  I then marked out the epicondylar axis on the distal femur.  The posterior condylar axis measured 3 degrees.  I then used the anterior referencing sizer and measured the femur to be a size 11.  The 4-In-1 cutting block was screwed into place in external rotation matching the posterior condylar angle, making our cuts perpendicular to the epicondylar axis.  Anterior, posterior and chamfer cuts were made with the sagittal saw.  The cutting block and cut pieces  were removed.  A lamina spreader was placed in 90 degrees of flexion.  The ACL, PCL, menisci, and posterior condylar osteophytes were removed.  A 10 mm spacer blocked was found to offer good flexion and extension gap balance after minimal in degree releasing.   The scoop retractor was then placed and the femoral finishing block was pinned in place.  The small sagittal saw was used as well as the lug drill to  finish the femur.  The block and cut surfaces were removed and the medullary canal hole filled with autograft bone from the cut pieces.  The tibia was delivered forward in deep flexion and external rotation.  A size G tray was selected and pinned into place centered on the medial 1/3 of the tibial tubercle.  The reamer and keel was used to prepare the tibia through the tray.    I then trialed with the size 11 femur, size G tibia, a 10 mm insert and the 38 patella.  I had excellent flexion/extension gap balance, excellent patella tracking.  Flexion was full and beyond 120 degrees; extension was zero.  These components were chosen and the staff opened them to me on the back table while the knee was lavaged copiously and the cement mixed.  The soft tissue was infiltrated with 60cc of exparel 1.3% through a 21 gauge needle.  I cemented in the components and removed all excess cement.  The polyethylene tibial component was snapped into place and the knee placed in extension while cement was hardening.  The capsule was infilltrated with 30cc of .25% Marcaine with epinephrine.  A hemovac was place in the joint exiting superolaterally.  A pain pump was place superomedially superficial to the arthrotomy.  Once the cement was hard, the tourniquet was let down.  Hemostasis was obtained.  The arthrotomy was closed with figure-8 #1 vicryl sutures.  The deep soft tissues were closed with #0 vicryls and the subcuticular layer closed with a running #2-0 vicryl.  The skin was reapproximated and closed with skin staples.  The wound was dressed with xeroform, 4 x4's, 2 ABD sponges, a single layer of webril and a TED stocking.   The patient was then awakened, extubated, and taken to the recovery room in stable condition.  BLOOD LOSS:  300cc DRAINS: 1 hemovac, 1 pain catheter COMPLICATIONS:  None.  PLAN OF CARE: Admit to inpatient   PATIENT DISPOSITION:  PACU - hemodynamically stable.   Delay start of Pharmacological  VTE agent (>24hrs) due to surgical blood loss or risk of bleeding:  not applicable  Please fax a copy of this op note to my office at [phone] (please only include page 1 and 2 of the Case Information op note)

## 2016-03-27 NOTE — Evaluation (Signed)
Occupational Therapy Evaluation Patient Details Name: Jason Robles MRN: [id number] DOB: 1945-08-31 Today's Date: 03/27/2016    History of Present Illness Patient is a 71 y.o. male presented with a history of pain in the right knee.  Right TKA 03/26/16.  Left TKA recently in Feb.  PMH: TIA, CP and anxiety as well as Raynaud's.     Clinical Impression   Patient evaluated by Occupational Therapy with no further acute OT needs identified. All education has been completed and the patient has no further questions. See below for any follow-up Occupational Therapy or equipment needs. OT to sign off. Thank you for referral.      Follow Up Recommendations  No OT follow up    Equipment Recommendations  None recommended by OT    Recommendations for Other Services       Precautions / Restrictions Precautions Precautions: Fall;Knee Precaution Booklet Issued: Yes (comment) Restrictions Weight Bearing Restrictions: Yes RLE Weight Bearing: Weight bearing as tolerated      Mobility Bed Mobility Overal bed mobility: Independent                Transfers                      Balance                                           ADL either performed or assessed with clinical judgement   ADL Overall ADL's : Modified independent                                       General ADL Comments: pt able to reach toes in the bed long sitting. educated on dressing R and then L. pt with previous surg in February and aware of OT. pt has all DME fo rhome. pt educated on transfer into and out of shower with wife present.      Vision Baseline Vision/History: Wears glasses Wears Glasses: At all times       Perception     Praxis      Pertinent Vitals/Pain Pain Assessment: Faces Faces Pain Scale: Hurts a little bit Pain Location: right knee Pain Descriptors / Indicators: Aching;Grimacing;Guarding Pain Intervention(s): Monitored during  session;Premedicated before session;Repositioned     Hand Dominance Right   Extremity/Trunk Assessment Upper Extremity Assessment Upper Extremity Assessment: Overall WFL for tasks assessed   Lower Extremity Assessment RLE Sensation:  (due to nerve block)   Cervical / Trunk Assessment Cervical / Trunk Assessment: Normal   Communication Communication Communication: HOH   Cognition Arousal/Alertness: Awake/alert Behavior During Therapy: WFL for tasks assessed/performed Overall Cognitive Status: Within Functional Limits for tasks assessed                                     General Comments  educated on dressing care and use of clean towels each shower    Exercises     Shoulder Instructions      Home Living Family/patient expects to be discharged to:: Private residence Living Arrangements: Spouse/significant other Available Help at Discharge: Family;Available 24 hours/day Type of Home: House Home Access: Stairs to enter [employer] of Steps: 3 Entrance Stairs-Rails: None Home Layout:  Two level Alternate Level Stairs-Number of Steps: 15 Alternate Level Stairs-Rails: Left Bathroom Shower/Tub: Occupational psychologist: Standard Bathroom Accessibility: Yes   Home Equipment: Environmental consultant - 2 wheels;Bedside commode;Other (comment) (CPM)          Prior Functioning/Environment Level of Independence: Independent        Comments: 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   Parke Poisson B 03/27/2016, 3:46 PM

## 2016-03-27 NOTE — Progress Notes (Signed)
Pt ready for discharge, all belongings with pt. Wife present. Discharge instructions and rx with pt. And reviewed. Pt demonstrates no s/sx of distress.

## 2016-03-27 NOTE — Discharge Summary (Signed)
SPORTS MEDICINE & JOINT REPLACEMENT   Lara Mulch, MD   Carlyon Shadow, PA-C [place], [place], [place]  [address]                             [phone]  PATIENT ID: Jason Robles        MRN:  [id number]          DOB/AGE: [birth year] / 71 y.o.    DISCHARGE SUMMARY  ADMISSION DATE:    03/26/2016 DISCHARGE DATE:   03/27/2016   ADMISSION DIAGNOSIS: primary osteoarthritis right knee    DISCHARGE DIAGNOSIS:  primary osteoarthritis right knee    ADDITIONAL DIAGNOSIS: Active Problems:   S/P total knee replacement  Past Medical History:  Diagnosis Date  . ABDOMINAL PAIN OTHER SPECIFIED SITE 04/30/2008  . ALLERGIC RHINITIS 08/25/2007  . Anxiety 06/28/2010  . BENIGN PROSTATIC HYPERTROPHY 08/21/2006  . Chest pain 06/28/2010  . COLONIC POLYPS, HX OF 04/18/2007  . Complication of anesthesia   . 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 Diverticulosis of colon (without mention of hemorrhage) 04/18/2007  . EXTERNAL HEMORRHOIDS 04/18/2007  . Headache(784.0) 09/20/2009   hx migraines  . HEARING LOSS, 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  . HYPERLIPIDEMIA 08/21/2006  . INSOMNIA-SLEEP DISORDER-UNSPEC 09/20/2009  . NONSPECIFIC ABN FINDING RAD & OTH EXAM GU ORGAN 05/03/2008  . PERIPHERAL VASCULAR DISEASE 08/25/2007  . PONV (postoperative nausea and vomiting)   . Raynaud's syndrome 08/21/2006    PROCEDURE: Procedure(s): RIGHT TOTAL KNEE ARTHROPLASTY on 03/26/2016  CONSULTS:    HISTORY:  See H&P in chart  HOSPITAL COURSE:  Jason Robles is a 71 y.o. admitted on 03/26/2016 and found to have a diagnosis of primary osteoarthritis right knee.  After appropriate laboratory studies were obtained  they were taken to the operating room on 03/26/2016 and underwent Procedure(s): RIGHT TOTAL KNEE ARTHROPLASTY.   They were given perioperative antibiotics:  Anti-infectives    Start     Dose/Rate Route Frequency Ordered Stop   03/26/16 1400  ceFAZolin (ANCEF) IVPB 1 g/50 mL premix     1  g 100 mL/hr over 30 Minutes Intravenous Every 6 hours 03/26/16 1136 03/26/16 2141   03/26/16 0700  ceFAZolin (ANCEF) IVPB 2g/100 mL premix     2 g 200 mL/hr over 30 Minutes Intravenous To ShortStay Surgical 03/25/16 1559 03/26/16 0742    .  Patient given tranexamic acid IV or topical and exparel intra-operatively.  Tolerated the procedure well.    POD# 1: Vital signs were stable.  Patient denied Chest pain, shortness of breath, or calf pain.  Patient was started on Lovenox 30 mg subcutaneously twice daily at 8am.  Consults to PT, OT, and care management were made.  The patient was weight bearing as tolerated.  CPM was placed on the operative leg 0-90 degrees for 6-8 hours a day. When out of the CPM, patient was placed in the foam block to achieve full extension. Incentive spirometry was taught.  Dressing was changed.       POD #2, Continued  PT for ambulation and exercise program.  IV saline locked.  O2 discontinued.    The remainder of the hospital course was dedicated to ambulation and strengthening.   The patient was discharged on 1 Day Post-Op in  Good condition.  Blood products given:none  DIAGNOSTIC STUDIES: Recent vital signs: Patient Vitals for the past 24 hrs:  BP Temp Temp src Pulse Resp SpO2  03/27/16 0408 117/73 97.5 F (36.4 C) Oral 63 16 100 %  03/26/16 2346 124/73 97.7 F (36.5 C) Oral 72 18 99 %  03/26/16 2130 122/73 98.1 F (36.7 C) Oral 74 - 100 %  03/26/16 1155 - - - - - 100 %  03/26/16 1143 (!) 143/82 98.4 F (36.9 C) Oral 61 18 100 %  03/26/16 1040 - 97 F (36.1 C) - - - -  03/26/16 1015 - - - (!) 56 11 100 %  03/26/16 1010 124/88 - - (!) 56 11 100 %  03/26/16 1000 - - - (!) 56 10 100 %  03/26/16 0955 124/83 - - (!) 55 13 100 %  03/26/16 0945 - - - (!) 57 11 100 %  03/26/16 0940 110/66 97.3 F (36.3 C) - 61 11 100 %       Recent laboratory studies:  Recent Labs  03/27/16 0521  WBC 7.8  HGB 11.4*  HCT 34.8*  PLT 164    Recent Labs   03/27/16 0521  NA 137  K 4.0  CL 101  CO2 29  BUN 13  CREATININE 0.83  GLUCOSE 107*  CALCIUM 9.0   Lab Results  Component Value Date   INR 0.89 05/02/2012     Recent Radiographic Studies :  No results found.  DISCHARGE INSTRUCTIONS: Discharge Instructions    CPM    Complete by:  As directed    Continuous passive motion machine (CPM):      Use the CPM from 0 to 90 for 6 hours per day.      You may increase by 10 per day.  You may break it up into 2 or 3 sessions per day.      Use CPM for 2 weeks or until you are told to stop.   Call MD / Call 911    Complete by:  As directed    If you experience chest pain or shortness of breath, CALL 911 and be transported to the hospital emergency room.  If you develope a fever above 101 F, pus (white drainage) or increased drainage or redness at the wound, or calf pain, call your surgeon's office.   Constipation Prevention    Complete by:  As directed    Drink plenty of fluids.  Prune juice may be helpful.  You may use a stool softener, such as Colace (over the counter) 100 mg twice a day.  Use MiraLax (over the counter) for constipation as needed.   Diet - low sodium heart healthy    Complete by:  As directed    Discharge instructions    Complete by:  As directed    INSTRUCTIONS AFTER JOINT REPLACEMENT   Remove items at home which could result in a fall. This includes throw rugs or furniture in walking pathways ICE to the affected joint every three hours while awake for 30 minutes at a time, for at least the first 3-5 days, and then as needed for pain and swelling.  Continue to use ice for pain and swelling. You may notice swelling that will progress down to the foot and ankle.  This is normal after surgery.  Elevate your leg when you are not up walking on it.   Continue to use the breathing machine you got in the hospital (incentive spirometer) which will help keep your temperature down.  It is common for your temperature to cycle up and  down following surgery, especially at night when you are not  up moving around and exerting yourself.  The breathing machine keeps your lungs expanded and your temperature down.   DIET:  As you were doing prior to hospitalization, we recommend a well-balanced diet.  DRESSING / WOUND CARE / SHOWERING  Keep the surgical dressing until follow up.  The dressing is water proof, so you can shower without any extra covering.  IF THE DRESSING FALLS OFF or the wound gets wet inside, change the dressing with sterile gauze.  Please use good hand washing techniques before changing the dressing.  Do not use any lotions or creams on the incision until instructed by your surgeon.    ACTIVITY  Increase activity slowly as tolerated, but follow the weight bearing instructions below.   No driving for 6 weeks or until further direction given by your physician.  You cannot drive while taking narcotics.  No lifting or carrying greater than 10 lbs. until further directed by your surgeon. Avoid periods of inactivity such as sitting longer than an hour when not asleep. This helps prevent blood clots.  You may return to work once you are authorized by your doctor.     WEIGHT BEARING   Weight bearing as tolerated with assist device (walker, cane, etc) as directed, use it as long as suggested by your surgeon or therapist, typically at least 4-6 weeks.   EXERCISES  Results after joint replacement surgery are often greatly improved when you follow the exercise, range of motion and muscle strengthening exercises prescribed by your doctor. Safety measures are also important to protect the joint from further injury. Any time any of these exercises cause you to have increased pain or swelling, decrease what you are doing until you are comfortable again and then slowly increase them. If you have problems or questions, call your caregiver or physical therapist for advice.   Rehabilitation is important following a joint  replacement. After just a few days of immobilization, the muscles of the leg can become weakened and shrink (atrophy).  These exercises are designed to build up the tone and strength of the thigh and leg muscles and to improve motion. Often times heat used for twenty to thirty minutes before working out will loosen up your tissues and help with improving the range of motion but do not use heat for the first two weeks following surgery (sometimes heat can increase post-operative swelling).   These exercises can be done on a training (exercise) mat, on the floor, on a table or on a bed. Use whatever works the best and is most comfortable for you.    Use music or television while you are exercising so that the exercises are a pleasant break in your day. This will make your life better with the exercises acting as a break in your routine that you can look forward to.   Perform all exercises about fifteen times, three times per day or as directed.  You should exercise both the operative leg and the other leg as well.   Exercises include:   Quad Sets - Tighten up the muscle on the front of the thigh (Quad) and hold for 5-10 seconds.   Straight Leg Raises - With your knee straight (if you were given a brace, keep it on), lift the leg to 60 degrees, hold for 3 seconds, and slowly lower the leg.  Perform this exercise against resistance later as your leg gets stronger.  Leg Slides: Lying on your back, slowly slide your foot toward your buttocks, bending your  knee up off the floor (only go as far as is comfortable). Then slowly slide your foot back down until your leg is flat on the floor again.  Angel Wings: Lying on your back spread your legs to the side as far apart as you can without causing discomfort.  Hamstring Strength:  Lying on your back, push your heel against the floor with your leg straight by tightening up the muscles of your buttocks.  Repeat, but this time bend your knee to a comfortable angle, and  push your heel against the floor.  You may put a pillow under the heel to make it more comfortable if necessary.   A rehabilitation program following joint replacement surgery can speed recovery and prevent re-injury in the future due to weakened muscles. Contact your doctor or a physical therapist for more information on knee rehabilitation.    CONSTIPATION  Constipation is defined medically as fewer than three stools per week and severe constipation as less than one stool per week.  Even if you have a regular bowel pattern at home, your normal regimen is likely to be disrupted due to multiple reasons following surgery.  Combination of anesthesia, postoperative narcotics, change in appetite and fluid intake all can affect your bowels.   YOU MUST use at least one of the following options; they are listed in order of increasing strength to get the job done.  They are all available over the counter, and you may need to use some, POSSIBLY even all of these options:    Drink plenty of fluids (prune juice may be helpful) and high fiber foods Colace 100 mg by mouth twice a day  Senokot for constipation as directed and as needed Dulcolax (bisacodyl), take with full glass of water  Miralax (polyethylene glycol) once or twice a day as needed.  If you have tried all these things and are unable to have a bowel movement in the first 3-4 days after surgery call either your surgeon or your primary doctor.    If you experience loose stools or diarrhea, hold the medications until you stool forms back up.  If your symptoms do not get better within 1 week or if they get worse, check with your doctor.  If you experience "the worst abdominal pain ever" or develop nausea or vomiting, please contact the office immediately for further recommendations for treatment.   ITCHING:  If you experience itching with your medications, try taking only a single pain pill, or even half a pain pill at a time.  You can also use  Benadryl over the counter for itching or also to help with sleep.   TED HOSE STOCKINGS:  Use stockings on both legs until for at least 2 weeks or as directed by physician office. They may be removed at night for sleeping.  MEDICATIONS:  See your medication summary on the "After Visit Summary" that nursing will review with you.  You may have some home medications which will be placed on hold until you complete the course of blood thinner medication.  It is important for you to complete the blood thinner medication as prescribed.  PRECAUTIONS:  If you experience chest pain or shortness of breath - call 911 immediately for transfer to the hospital emergency department.   If you develop a fever greater that 101 F, purulent drainage from wound, increased redness or drainage from wound, foul odor from the wound/dressing, or calf pain - CONTACT YOUR SURGEON.  FOLLOW-UP APPOINTMENTS:  If you do not already have a post-op appointment, please call the office for an appointment to be seen by your surgeon.  Guidelines for how soon to be seen are listed in your "After Visit Summary", but are typically between 1-4 weeks after surgery.  OTHER INSTRUCTIONS:   Knee Replacement:  Do not place pillow under knee, focus on keeping the knee straight while resting. CPM instructions: 0-90 degrees, 2 hours in the morning, 2 hours in the afternoon, and 2 hours in the evening. Place foam block, curve side up under heel at all times except when in CPM or when walking.  DO NOT modify, tear, cut, or change the foam block in any way.  MAKE SURE YOU:  Understand these instructions.  Get help right away if you are not doing well or get worse.    Thank you for letting us be a part of your medical care team.  It is a privilege we respect greatly.  We hope these instructions will help you stay on track for a fast and full recovery!   Increase activity slowly as tolerated     Complete by:  As directed       DISCHARGE MEDICATIONS:   Allergies as of 03/27/2016      Reactions   Iohexol Itching   Itching post 125cc Omni injection. Treated w/ 50 mg PO benedryl.  Onset Date: [id number]      Medication List    STOP taking these medications   ibuprofen 200 MG tablet Commonly known as:  ADVIL,MOTRIN     TAKE these medications   aspirin 325 MG EC tablet Take 1 tablet (325 mg total) by mouth 2 (two) times daily.   atorvastatin 10 MG tablet Commonly known as:  LIPITOR Take 1 tablet (10 mg total) by mouth daily.   methocarbamol 500 MG tablet Commonly known as:  ROBAXIN Take 1-2 tablets (500-1,000 mg total) by mouth every 6 (six) hours as needed for muscle spasms.   Oxycodone HCl 10 MG Tabs Take 1 tablet (10 mg total) by mouth every 3 (three) hours as needed for breakthrough pain.   zolpidem 10 MG tablet Commonly known as:  AMBIEN Take 1 tablet (10 mg total) by mouth at bedtime.            Durable Medical Equipment        Start     Ordered   03/26/16 1137  DME Walker rolling  Once    Question:  Patient needs a walker to treat with the following condition  Answer:  S/P total knee replacement   03/26/16 1136   03/26/16 1137  DME 3 n 1  Once     03/26/16 1136   03/26/16 1137  DME Bedside commode  Once    Question:  Patient needs a bedside commode to treat with the following condition  Answer:  S/P total knee replacement   03/26/16 1136      FOLLOW UP VISIT:    DISPOSITION: HOME VS. SNF  CONDITION:  Good   Donia Ast 03/27/2016, 7:05 AM

## 2016-03-27 NOTE — Progress Notes (Signed)
SPORTS MEDICINE AND JOINT REPLACEMENT  Lara Mulch, MD    Carlyon Shadow, PA-C [place], [place], [place]  [address]                             [phone]   PROGRESS NOTE  Subjective:  negative for Chest Pain  negative for Shortness of Breath  negative for Nausea/Vomiting   negative for Calf Pain  negative for Bowel Movement   Tolerating Diet: yes         Patient reports pain as 3 on 0-10 scale.    Objective: Vital signs in last 24 hours:   Patient Vitals for the past 24 hrs:  BP Temp Temp src Pulse Resp SpO2  03/27/16 0408 117/73 97.5 F (36.4 C) Oral 63 16 100 %  03/26/16 2346 124/73 97.7 F (36.5 C) Oral 72 18 99 %  03/26/16 2130 122/73 98.1 F (36.7 C) Oral 74 - 100 %  03/26/16 1155 - - - - - 100 %  03/26/16 1143 (!) 143/82 98.4 F (36.9 C) Oral 61 18 100 %  03/26/16 1040 - 97 F (36.1 C) - - - -  03/26/16 1015 - - - (!) 56 11 100 %  03/26/16 1010 124/88 - - (!) 56 11 100 %  03/26/16 1000 - - - (!) 56 10 100 %  03/26/16 0955 124/83 - - (!) 55 13 100 %  03/26/16 0945 - - - (!) 57 11 100 %  03/26/16 0940 110/66 97.3 F (36.3 C) - 61 11 100 %    @flow {1959:LAST@   Intake/Output from previous day:   03/26 0701 - 03/27 0700 In: 1850 [P.O.:150; I.V.:1700] Out: 2600 [Urine:2500]   Intake/Output this shift:   No intake/output data recorded.   Intake/Output      03/26 0701 - 03/27 0700 03/27 0701 - 03/28 0700   P.O. 150    I.V. (mL/kg) 1700 (21.3)    Total Intake(mL/kg) 1850 (23.2)    Urine (mL/kg/hr) 2500 (1.3)    Blood 100 (0.1)    Total Output 2600     Net -750             LABORATORY DATA:  Recent Labs  03/27/16 0521  WBC 7.8  HGB 11.4*  HCT 34.8*  PLT 164    Recent Labs  03/27/16 0521  NA 137  K 4.0  CL 101  CO2 29  BUN 13  CREATININE 0.83  GLUCOSE 107*  CALCIUM 9.0   Lab Results  Component Value Date   INR 0.89 05/02/2012    Examination:  General appearance: alert, cooperative and no distress Extremities:  extremities normal, atraumatic, no cyanosis or edema  Wound Exam: clean, dry, intact   Drainage:  None: wound tissue dry  Motor Exam: Quadriceps and Hamstrings Intact  Sensory Exam: Superficial Peroneal, Deep Peroneal and Tibial normal   Assessment:    1 Day Post-Op  Procedure(s) (LRB): RIGHT TOTAL KNEE ARTHROPLASTY (Right)  ADDITIONAL DIAGNOSIS:  Active Problems:   S/P total knee replacement     Plan: Physical Therapy as ordered Weight Bearing as Tolerated (WBAT)  DVT Prophylaxis:  Aspirin  DISCHARGE PLAN: Home  DISCHARGE NEEDS: HHPT   Patient doing well, expected D/C home today         Donia Ast 03/27/2016, 7:01 AM

## 2016-03-27 NOTE — Telephone Encounter (Signed)
Pt was on TCM list admitted 3/26 for primary osteoarthritis right knee.  Pt had (R) knee replacement and was d/c 3/27 and will f/u w/Dr. Ronnie Derby...Johny Chess

## 2016-03-27 NOTE — Progress Notes (Signed)
Physical Therapy Discharge Patient Details Name: Jason Robles MRN: [id number] DOB: 08/24/1945 Today's Date: 03/27/2016 Time: 9509-3267 PT Time Calculation (min) (ACUTE ONLY): 25 min  Patient discharged from PT services secondary to goals met and no further PT needs identified.  Please see latest therapy progress note for current level of functioning and progress toward goals.    Progress and discharge plan discussed with patient and/or caregiver: Patient/Caregiver agrees with plan  GP Functional Assessment Tool Used: AM-PAC 6 Clicks Basic Mobility Functional Limitation: Mobility: Walking and moving around Mobility: Walking and Moving Around Current Status (T2458): 0 percent impaired, limited or restricted Mobility: Walking and Moving Around Goal Status (K9983): 0 percent impaired, limited or restricted Mobility: Walking and Moving Around Discharge Status [phone]): 0 percent impaired, limited or restricted   Denice Paradise 03/27/2016, 11:10 AM   Latrelle Bazar,PT Acute Rehabilitation [phone] (pager)

## 2016-03-27 NOTE — Progress Notes (Signed)
qPhysical Therapy Treatment and D/C Patient Details Name: Jason Robles MRN: [id number] DOB: 1945/04/05 Today's Date: 03/27/2016    History of Present Illness Patient is a 71 y.o. male presented with a history of pain in the right knee.  Right TKA 03/26/16.  Left TKA recently in Feb.  PMH: TIA, CP and anxiety as well as Raynaud's.      PT Comments    Pt admitted with above diagnosis. Pt currently without significant functional limitations. Pt has met all goals and is going home today to continue ROM therapy.  Modif I with RW.  HHPT to continue.  Has all equipment.  D/C inpt PT.  Thanks.    Follow Up Recommendations  Supervision - Intermittent;Home health PT     Equipment Recommendations  None recommended by PT    Recommendations for Other Services       Precautions / Restrictions Precautions Precautions: Fall;Knee Precaution Booklet Issued: Yes (comment) Restrictions Weight Bearing Restrictions: Yes RLE Weight Bearing: Weight bearing as tolerated    Mobility  Bed Mobility Overal bed mobility: Needs Assistance Bed Mobility: Supine to Sit     Supine to sit: Independent     General bed mobility comments: Alittle assist for initiation of movement only.   Transfers Overall transfer level: Needs assistance Equipment used: Rolling walker (2 wheeled) Transfers: Sit to/from Stand Sit to Stand: Modified independent (Device/Increase time)         General transfer comment: Cues for hand placement only.   Ambulation/Gait Ambulation/Gait assistance: Modified independent (Device/Increase time) Ambulation Distance (Feet): 250 Feet Assistive device: Rolling walker (2 wheeled) Gait Pattern/deviations: Step-through pattern;Decreased stance time - right;Decreased weight shift to right;Antalgic   Gait velocity interpretation: <1.8 ft/sec, indicative of risk for recurrent falls General Gait Details: Pt with good sequencing with RW.  No LOB.   Stairs Stairs: Yes   Stair  Management: One rail Left;Step to pattern;Forwards Number of Stairs: 15 General stair comments: Pt was able to ascend and descend stairs on his own without assist.  Did cue for sequencing x 1.  Educated wife regarding technique as well.   Wheelchair Mobility    Modified Rankin (Stroke Patients Only)       Balance Overall balance assessment: Needs assistance Sitting-balance support: No upper extremity supported;Feet supported Sitting balance-Leahy Scale: Good     Standing balance support: Bilateral upper extremity supported;During functional activity Standing balance-Leahy Scale: Fair Standing balance comment: Can stand statically to wash hands at sink without UE support.              High level balance activites: Direction changes;Turns;Sudden stops High Level Balance Comments: Modif I with RW.             Cognition Arousal/Alertness: Awake/alert Behavior During Therapy: WFL for tasks assessed/performed Overall Cognitive Status: Within Functional Limits for tasks assessed                                        Exercises Total Joint Exercises Ankle Circles/Pumps: AROM;Both;10 reps;Supine Quad Sets: AROM;Both;10 reps;Supine Towel Squeeze: AROM;Both;10 reps;Supine Heel Slides: AROM;Both;10 reps;Supine Hip ABduction/ADduction: AROM;Both;10 reps;Supine Long Arc Quad: AROM;Both;10 reps;Seated Knee Flexion: AROM;Both;10 reps;Supine Goniometric ROM: 5-105 degrees    General Comments        Pertinent Vitals/Pain Pain Assessment: 0-10 Pain Score: 7  Pain Location: right knee Pain Descriptors / Indicators: Aching;Grimacing;Guarding Pain Intervention(s): Limited activity within patient's tolerance;Monitored during  session;Premedicated before session;Repositioned;Ice applied    Home Living                      Prior Function            PT Goals (current goals can now be found in the care plan section) Acute Rehab PT Goals Patient  Stated Goal: to go home PT Goal Formulation: With patient Time For Goal Achievement: 04/02/16 Potential to Achieve Goals: Good Progress towards PT goals: Goals met/education completed, patient discharged from PT    Frequency    7X/week      PT Plan Discharge plan needs to be updated    Co-evaluation             End of Session Equipment Utilized During Treatment: Gait belt Activity Tolerance: Patient tolerated treatment well Patient left: with call bell/phone within reach;with family/visitor present;in bed Nurse Communication: Mobility status;Weight bearing status PT Visit Diagnosis: Unsteadiness on feet (R26.81);Muscle weakness (generalized) (M62.81);Pain Pain - Right/Left: Right Pain - part of body: Knee     Time: 0840-0905 PT Time Calculation (min) (ACUTE ONLY): 25 min  Charges:  $Gait Training: 8-22 mins $Therapeutic Exercise: 8-22 mins                    G Codes:  Functional Assessment Tool Used: AM-PAC 6 Clicks Basic Mobility Functional Limitation: Mobility: Walking and moving around Mobility: Walking and Moving Around Current Status (I9518): 0 percent impaired, limited or restricted Mobility: Walking and Moving Around Goal Status (A4166): 0 percent impaired, limited or restricted Mobility: Walking and Moving Around Discharge Status [phone]): 0 percent impaired, limited or restricted    [facility] Acute Rehabilitation [phone] (pager)    Denice Paradise 03/27/2016, 11:08 AM

## 2016-03-28 DIAGNOSIS — Z96652 Presence of left artificial knee joint: Secondary | ICD-10-CM | POA: Diagnosis not present

## 2016-03-28 DIAGNOSIS — M150 Primary generalized (osteo)arthritis: Secondary | ICD-10-CM | POA: Diagnosis not present

## 2016-03-28 DIAGNOSIS — R69 Illness, unspecified: Secondary | ICD-10-CM | POA: Diagnosis not present

## 2016-03-28 DIAGNOSIS — Z471 Aftercare following joint replacement surgery: Secondary | ICD-10-CM | POA: Diagnosis not present

## 2016-03-28 DIAGNOSIS — I7300 Raynaud's syndrome without gangrene: Secondary | ICD-10-CM | POA: Diagnosis not present

## 2016-03-28 DIAGNOSIS — Z96651 Presence of right artificial knee joint: Secondary | ICD-10-CM | POA: Diagnosis not present

## 2016-03-28 DIAGNOSIS — I739 Peripheral vascular disease, unspecified: Secondary | ICD-10-CM | POA: Diagnosis not present

## 2016-03-28 DIAGNOSIS — Z87891 Personal history of nicotine dependence: Secondary | ICD-10-CM | POA: Diagnosis not present

## 2016-03-30 DIAGNOSIS — M150 Primary generalized (osteo)arthritis: Secondary | ICD-10-CM | POA: Diagnosis not present

## 2016-03-30 DIAGNOSIS — Z471 Aftercare following joint replacement surgery: Secondary | ICD-10-CM | POA: Diagnosis not present

## 2016-03-30 DIAGNOSIS — I739 Peripheral vascular disease, unspecified: Secondary | ICD-10-CM | POA: Diagnosis not present

## 2016-03-30 DIAGNOSIS — Z96652 Presence of left artificial knee joint: Secondary | ICD-10-CM | POA: Diagnosis not present

## 2016-03-30 DIAGNOSIS — I7300 Raynaud's syndrome without gangrene: Secondary | ICD-10-CM | POA: Diagnosis not present

## 2016-03-30 DIAGNOSIS — R69 Illness, unspecified: Secondary | ICD-10-CM | POA: Diagnosis not present

## 2016-03-30 DIAGNOSIS — Z96651 Presence of right artificial knee joint: Secondary | ICD-10-CM | POA: Diagnosis not present

## 2016-03-30 DIAGNOSIS — Z87891 Personal history of nicotine dependence: Secondary | ICD-10-CM | POA: Diagnosis not present

## 2016-04-01 DIAGNOSIS — M1711 Unilateral primary osteoarthritis, right knee: Secondary | ICD-10-CM | POA: Diagnosis not present

## 2016-04-01 DIAGNOSIS — Z96651 Presence of right artificial knee joint: Secondary | ICD-10-CM | POA: Diagnosis not present

## 2016-04-02 DIAGNOSIS — Z96652 Presence of left artificial knee joint: Secondary | ICD-10-CM | POA: Diagnosis not present

## 2016-04-02 DIAGNOSIS — Z96651 Presence of right artificial knee joint: Secondary | ICD-10-CM | POA: Diagnosis not present

## 2016-04-02 DIAGNOSIS — I7300 Raynaud's syndrome without gangrene: Secondary | ICD-10-CM | POA: Diagnosis not present

## 2016-04-02 DIAGNOSIS — Z471 Aftercare following joint replacement surgery: Secondary | ICD-10-CM | POA: Diagnosis not present

## 2016-04-02 DIAGNOSIS — R69 Illness, unspecified: Secondary | ICD-10-CM | POA: Diagnosis not present

## 2016-04-02 DIAGNOSIS — I739 Peripheral vascular disease, unspecified: Secondary | ICD-10-CM | POA: Diagnosis not present

## 2016-04-02 DIAGNOSIS — M150 Primary generalized (osteo)arthritis: Secondary | ICD-10-CM | POA: Diagnosis not present

## 2016-04-02 DIAGNOSIS — Z87891 Personal history of nicotine dependence: Secondary | ICD-10-CM | POA: Diagnosis not present

## 2016-04-03 DIAGNOSIS — Z96652 Presence of left artificial knee joint: Secondary | ICD-10-CM | POA: Diagnosis not present

## 2016-04-03 DIAGNOSIS — Z87891 Personal history of nicotine dependence: Secondary | ICD-10-CM | POA: Diagnosis not present

## 2016-04-03 DIAGNOSIS — Z96651 Presence of right artificial knee joint: Secondary | ICD-10-CM | POA: Diagnosis not present

## 2016-04-03 DIAGNOSIS — Z471 Aftercare following joint replacement surgery: Secondary | ICD-10-CM | POA: Diagnosis not present

## 2016-04-03 DIAGNOSIS — I739 Peripheral vascular disease, unspecified: Secondary | ICD-10-CM | POA: Diagnosis not present

## 2016-04-03 DIAGNOSIS — M150 Primary generalized (osteo)arthritis: Secondary | ICD-10-CM | POA: Diagnosis not present

## 2016-04-03 DIAGNOSIS — R69 Illness, unspecified: Secondary | ICD-10-CM | POA: Diagnosis not present

## 2016-04-03 DIAGNOSIS — I7300 Raynaud's syndrome without gangrene: Secondary | ICD-10-CM | POA: Diagnosis not present

## 2016-04-04 DIAGNOSIS — I7300 Raynaud's syndrome without gangrene: Secondary | ICD-10-CM | POA: Diagnosis not present

## 2016-04-04 DIAGNOSIS — Z96651 Presence of right artificial knee joint: Secondary | ICD-10-CM | POA: Diagnosis not present

## 2016-04-04 DIAGNOSIS — R69 Illness, unspecified: Secondary | ICD-10-CM | POA: Diagnosis not present

## 2016-04-04 DIAGNOSIS — Z87891 Personal history of nicotine dependence: Secondary | ICD-10-CM | POA: Diagnosis not present

## 2016-04-04 DIAGNOSIS — I739 Peripheral vascular disease, unspecified: Secondary | ICD-10-CM | POA: Diagnosis not present

## 2016-04-04 DIAGNOSIS — Z471 Aftercare following joint replacement surgery: Secondary | ICD-10-CM | POA: Diagnosis not present

## 2016-04-04 DIAGNOSIS — Z96652 Presence of left artificial knee joint: Secondary | ICD-10-CM | POA: Diagnosis not present

## 2016-04-04 DIAGNOSIS — M150 Primary generalized (osteo)arthritis: Secondary | ICD-10-CM | POA: Diagnosis not present

## 2016-04-05 DIAGNOSIS — R262 Difficulty in walking, not elsewhere classified: Secondary | ICD-10-CM | POA: Diagnosis not present

## 2016-04-05 DIAGNOSIS — M25661 Stiffness of right knee, not elsewhere classified: Secondary | ICD-10-CM | POA: Diagnosis not present

## 2016-04-05 DIAGNOSIS — Z96651 Presence of right artificial knee joint: Secondary | ICD-10-CM | POA: Diagnosis not present

## 2016-04-05 DIAGNOSIS — Z471 Aftercare following joint replacement surgery: Secondary | ICD-10-CM | POA: Diagnosis not present

## 2016-04-05 DIAGNOSIS — M25461 Effusion, right knee: Secondary | ICD-10-CM | POA: Diagnosis not present

## 2016-04-06 NOTE — Progress Notes (Signed)
OT NOTE- LATE GCODE ENTRY    03/27/16 1500  OT G-codes **NOT FOR INPATIENT CLASS**  Functional Assessment Tool Used Clinical judgement  Functional Limitation Self care  Self Care Current Status [phone]) [facility]  Self Care Goal Status ([id number]) [facility]  Self Care Discharge Status [phone]) [facility]          Jeri Modena   OTR/L Pager: [phone] Office: [phone] .

## 2016-04-09 DIAGNOSIS — Z96651 Presence of right artificial knee joint: Secondary | ICD-10-CM | POA: Diagnosis not present

## 2016-04-09 DIAGNOSIS — M25661 Stiffness of right knee, not elsewhere classified: Secondary | ICD-10-CM | POA: Diagnosis not present

## 2016-04-09 DIAGNOSIS — M25461 Effusion, right knee: Secondary | ICD-10-CM | POA: Diagnosis not present

## 2016-04-09 DIAGNOSIS — R262 Difficulty in walking, not elsewhere classified: Secondary | ICD-10-CM | POA: Diagnosis not present

## 2016-04-12 DIAGNOSIS — M25461 Effusion, right knee: Secondary | ICD-10-CM | POA: Diagnosis not present

## 2016-04-12 DIAGNOSIS — Z96651 Presence of right artificial knee joint: Secondary | ICD-10-CM | POA: Diagnosis not present

## 2016-04-12 DIAGNOSIS — R262 Difficulty in walking, not elsewhere classified: Secondary | ICD-10-CM | POA: Diagnosis not present

## 2016-04-12 DIAGNOSIS — M25661 Stiffness of right knee, not elsewhere classified: Secondary | ICD-10-CM | POA: Diagnosis not present

## 2016-04-16 DIAGNOSIS — M25661 Stiffness of right knee, not elsewhere classified: Secondary | ICD-10-CM | POA: Diagnosis not present

## 2016-04-16 DIAGNOSIS — M25461 Effusion, right knee: Secondary | ICD-10-CM | POA: Diagnosis not present

## 2016-04-16 DIAGNOSIS — Z96651 Presence of right artificial knee joint: Secondary | ICD-10-CM | POA: Diagnosis not present

## 2016-04-16 DIAGNOSIS — R262 Difficulty in walking, not elsewhere classified: Secondary | ICD-10-CM | POA: Diagnosis not present

## 2016-04-18 DIAGNOSIS — M25461 Effusion, right knee: Secondary | ICD-10-CM | POA: Diagnosis not present

## 2016-04-18 DIAGNOSIS — M25661 Stiffness of right knee, not elsewhere classified: Secondary | ICD-10-CM | POA: Diagnosis not present

## 2016-04-18 DIAGNOSIS — Z96651 Presence of right artificial knee joint: Secondary | ICD-10-CM | POA: Diagnosis not present

## 2016-04-18 DIAGNOSIS — R262 Difficulty in walking, not elsewhere classified: Secondary | ICD-10-CM | POA: Diagnosis not present

## 2016-04-23 DIAGNOSIS — Z96651 Presence of right artificial knee joint: Secondary | ICD-10-CM | POA: Diagnosis not present

## 2016-04-23 DIAGNOSIS — R262 Difficulty in walking, not elsewhere classified: Secondary | ICD-10-CM | POA: Diagnosis not present

## 2016-04-23 DIAGNOSIS — M25661 Stiffness of right knee, not elsewhere classified: Secondary | ICD-10-CM | POA: Diagnosis not present

## 2016-04-23 DIAGNOSIS — M25461 Effusion, right knee: Secondary | ICD-10-CM | POA: Diagnosis not present

## 2016-04-25 DIAGNOSIS — Z96651 Presence of right artificial knee joint: Secondary | ICD-10-CM | POA: Diagnosis not present

## 2016-04-25 DIAGNOSIS — M25661 Stiffness of right knee, not elsewhere classified: Secondary | ICD-10-CM | POA: Diagnosis not present

## 2016-04-25 DIAGNOSIS — R262 Difficulty in walking, not elsewhere classified: Secondary | ICD-10-CM | POA: Diagnosis not present

## 2016-04-25 DIAGNOSIS — M25461 Effusion, right knee: Secondary | ICD-10-CM | POA: Diagnosis not present

## 2016-05-02 DIAGNOSIS — M25661 Stiffness of right knee, not elsewhere classified: Secondary | ICD-10-CM | POA: Diagnosis not present

## 2016-05-02 DIAGNOSIS — Z96651 Presence of right artificial knee joint: Secondary | ICD-10-CM | POA: Diagnosis not present

## 2016-05-02 DIAGNOSIS — R262 Difficulty in walking, not elsewhere classified: Secondary | ICD-10-CM | POA: Diagnosis not present

## 2016-05-02 DIAGNOSIS — M25461 Effusion, right knee: Secondary | ICD-10-CM | POA: Diagnosis not present

## 2016-05-04 DIAGNOSIS — M25461 Effusion, right knee: Secondary | ICD-10-CM | POA: Diagnosis not present

## 2016-05-04 DIAGNOSIS — Z96651 Presence of right artificial knee joint: Secondary | ICD-10-CM | POA: Diagnosis not present

## 2016-05-04 DIAGNOSIS — R262 Difficulty in walking, not elsewhere classified: Secondary | ICD-10-CM | POA: Diagnosis not present

## 2016-05-04 DIAGNOSIS — M25661 Stiffness of right knee, not elsewhere classified: Secondary | ICD-10-CM | POA: Diagnosis not present

## 2016-05-08 DIAGNOSIS — R29898 Other symptoms and signs involving the musculoskeletal system: Secondary | ICD-10-CM | POA: Diagnosis not present

## 2016-05-08 DIAGNOSIS — M25661 Stiffness of right knee, not elsewhere classified: Secondary | ICD-10-CM | POA: Diagnosis not present

## 2016-05-08 DIAGNOSIS — Z96651 Presence of right artificial knee joint: Secondary | ICD-10-CM | POA: Diagnosis not present

## 2016-05-08 DIAGNOSIS — M25461 Effusion, right knee: Secondary | ICD-10-CM | POA: Diagnosis not present

## 2016-05-08 DIAGNOSIS — R262 Difficulty in walking, not elsewhere classified: Secondary | ICD-10-CM | POA: Diagnosis not present

## 2016-05-15 DIAGNOSIS — M25661 Stiffness of right knee, not elsewhere classified: Secondary | ICD-10-CM | POA: Diagnosis not present

## 2016-05-15 DIAGNOSIS — Z96651 Presence of right artificial knee joint: Secondary | ICD-10-CM | POA: Diagnosis not present

## 2016-05-15 DIAGNOSIS — R29898 Other symptoms and signs involving the musculoskeletal system: Secondary | ICD-10-CM | POA: Diagnosis not present

## 2016-05-15 DIAGNOSIS — R262 Difficulty in walking, not elsewhere classified: Secondary | ICD-10-CM | POA: Diagnosis not present

## 2016-05-15 DIAGNOSIS — M25461 Effusion, right knee: Secondary | ICD-10-CM | POA: Diagnosis not present

## 2016-05-16 DIAGNOSIS — M25461 Effusion, right knee: Secondary | ICD-10-CM | POA: Diagnosis not present

## 2016-05-16 DIAGNOSIS — R262 Difficulty in walking, not elsewhere classified: Secondary | ICD-10-CM | POA: Diagnosis not present

## 2016-05-16 DIAGNOSIS — Z96651 Presence of right artificial knee joint: Secondary | ICD-10-CM | POA: Diagnosis not present

## 2016-05-16 DIAGNOSIS — M25661 Stiffness of right knee, not elsewhere classified: Secondary | ICD-10-CM | POA: Diagnosis not present

## 2016-05-16 DIAGNOSIS — R29898 Other symptoms and signs involving the musculoskeletal system: Secondary | ICD-10-CM | POA: Diagnosis not present

## 2016-05-29 ENCOUNTER — Encounter: Payer: Self-pay | Admitting: Gastroenterology

## 2016-06-05 DIAGNOSIS — R29898 Other symptoms and signs involving the musculoskeletal system: Secondary | ICD-10-CM | POA: Diagnosis not present

## 2016-06-05 DIAGNOSIS — M25661 Stiffness of right knee, not elsewhere classified: Secondary | ICD-10-CM | POA: Diagnosis not present

## 2016-06-05 DIAGNOSIS — R262 Difficulty in walking, not elsewhere classified: Secondary | ICD-10-CM | POA: Diagnosis not present

## 2016-06-05 DIAGNOSIS — Z96651 Presence of right artificial knee joint: Secondary | ICD-10-CM | POA: Diagnosis not present

## 2016-06-05 DIAGNOSIS — M25461 Effusion, right knee: Secondary | ICD-10-CM | POA: Diagnosis not present

## 2016-06-19 ENCOUNTER — Telehealth: Payer: Self-pay | Admitting: Internal Medicine

## 2016-06-19 DIAGNOSIS — J349 Unspecified disorder of nose and nasal sinuses: Secondary | ICD-10-CM

## 2016-06-19 DIAGNOSIS — R431 Parosmia: Secondary | ICD-10-CM

## 2016-06-19 NOTE — Telephone Encounter (Signed)
[place] for ENT to look into possible sinus disease

## 2016-06-19 NOTE — Telephone Encounter (Signed)
Pt called stating since February when he had his knee replacement surgery everything he smells stinks, a steak stinks, his shirt stinks, everything. He wants to know if there is any type of referral that can be put in for this, maybe an ENT   Please advise   If you call back before 6 please call Pt [phone]

## 2016-06-20 NOTE — Telephone Encounter (Signed)
Called pt, LVM informing him of referral.

## 2016-06-27 DIAGNOSIS — R430 Anosmia: Secondary | ICD-10-CM | POA: Diagnosis not present

## 2016-06-27 DIAGNOSIS — J310 Chronic rhinitis: Secondary | ICD-10-CM | POA: Diagnosis not present

## 2016-07-13 ENCOUNTER — Encounter (HOSPITAL_COMMUNITY): Payer: Self-pay | Admitting: Orthopedic Surgery

## 2016-07-13 NOTE — Anesthesia Postprocedure Evaluation (Signed)
Anesthesia Post Note  Patient: Jason Robles  Procedure(s) Performed: Procedure(s) (LRB): TOTAL KNEE ARTHROPLASTY (Left)     Anesthesia Post Evaluation  Last Vitals:  Vitals:   02/07/16 0424 02/07/16 1331  BP: 120/71 (!) 143/76  Pulse: 65 85  Resp: 16 18  Temp: 36.6 C 37 C    Last Pain:  Vitals:   02/07/16 1331  TempSrc: Oral  PainSc:                  Nolon Nations

## 2016-07-13 NOTE — Addendum Note (Signed)
Addendum  created 07/13/16 0911 by Nolon Nations, MD   Sign clinical note

## 2016-08-28 ENCOUNTER — Other Ambulatory Visit: Payer: Self-pay | Admitting: Internal Medicine

## 2016-08-31 ENCOUNTER — Other Ambulatory Visit: Payer: Self-pay | Admitting: Internal Medicine

## 2016-08-31 NOTE — Telephone Encounter (Signed)
Faxed

## 2016-08-31 NOTE — Telephone Encounter (Signed)
Done Done hardcopy to [employer]

## 2016-09-11 DIAGNOSIS — H524 Presbyopia: Secondary | ICD-10-CM | POA: Diagnosis not present

## 2016-09-12 ENCOUNTER — Other Ambulatory Visit (INDEPENDENT_AMBULATORY_CARE_PROVIDER_SITE_OTHER): Payer: Medicare HMO

## 2016-09-12 ENCOUNTER — Encounter: Payer: Self-pay | Admitting: Internal Medicine

## 2016-09-12 ENCOUNTER — Ambulatory Visit (INDEPENDENT_AMBULATORY_CARE_PROVIDER_SITE_OTHER): Payer: Medicare HMO | Admitting: Internal Medicine

## 2016-09-12 VITALS — BP 110/76 | HR 77 | Temp 98.2°F | Ht 71.5 in | Wt 175.0 lb

## 2016-09-12 DIAGNOSIS — Z0000 Encounter for general adult medical examination without abnormal findings: Secondary | ICD-10-CM

## 2016-09-12 DIAGNOSIS — E559 Vitamin D deficiency, unspecified: Secondary | ICD-10-CM | POA: Diagnosis not present

## 2016-09-12 DIAGNOSIS — Z1159 Encounter for screening for other viral diseases: Secondary | ICD-10-CM

## 2016-09-12 DIAGNOSIS — Z23 Encounter for immunization: Secondary | ICD-10-CM | POA: Diagnosis not present

## 2016-09-12 LAB — BASIC METABOLIC PANEL
BUN: 18 mg/dL (ref 6–23)
CHLORIDE: 100 meq/L (ref 96–112)
CO2: 30 meq/L (ref 19–32)
CREATININE: 1 mg/dL (ref 0.40–1.50)
Calcium: 10.4 mg/dL (ref 8.4–10.5)
GFR: 78.67 mL/min (ref >60.00)
GLUCOSE: 100 mg/dL — AB (ref 70–99)
Potassium: 5.1 mEq/L (ref 3.5–5.1)
Sodium: 138 mEq/L (ref 135–145)

## 2016-09-12 LAB — LIPID PANEL
CHOL/HDL RATIO: 3
Cholesterol: 196 mg/dL (ref 0–200)
HDL: 68.3 mg/dL (ref >39.00)
LDL Cholesterol: 93 mg/dL (ref 0–99)
NonHDL: 127.43
TRIGLYCERIDES: 171 mg/dL — AB (ref 0.0–149.0)
VLDL: 34.2 mg/dL (ref 0.0–40.0)

## 2016-09-12 LAB — URINALYSIS, ROUTINE W REFLEX MICROSCOPIC
HGB URINE DIPSTICK: NEGATIVE
Leukocytes, UA: NEGATIVE
NITRITE: NEGATIVE
PH: 6.5 (ref 5.0–8.0)
Specific Gravity, Urine: 1.02 (ref 1.000–1.030)
TOTAL PROTEIN, URINE-UPE24: NEGATIVE
URINE GLUCOSE: NEGATIVE
UROBILINOGEN UA: 1 (ref 0.0–1.0)

## 2016-09-12 LAB — CBC WITH DIFFERENTIAL/PLATELET
BASOS ABS: 0 10*3/uL (ref 0.0–0.1)
Basophils Relative: 0.6 % (ref 0.0–3.0)
EOS ABS: 0.1 10*3/uL (ref 0.0–0.7)
Eosinophils Relative: 1.1 % (ref 0.0–5.0)
HEMATOCRIT: 44.1 % (ref 39.0–52.0)
Hemoglobin: 14.8 g/dL (ref 13.0–17.0)
LYMPHS PCT: 36.2 % (ref 12.0–46.0)
Lymphs Abs: 2.1 10*3/uL (ref 0.7–4.0)
MCHC: 33.5 g/dL (ref 30.0–36.0)
MCV: 85 fl (ref 78.0–100.0)
Monocytes Absolute: 0.4 10*3/uL (ref 0.1–1.0)
Monocytes Relative: 6.7 % (ref 3.0–12.0)
Neutro Abs: 3.2 10*3/uL (ref 1.4–7.7)
Neutrophils Relative %: 55.4 % (ref 43.0–77.0)
Platelets: 208 10*3/uL (ref 150.0–400.0)
RBC: 5.18 Mil/uL (ref 4.22–5.81)
RDW: 15.6 % — ABNORMAL HIGH (ref 11.5–15.5)
WBC: 5.8 10*3/uL (ref 4.0–10.5)

## 2016-09-12 LAB — HEPATIC FUNCTION PANEL
ALBUMIN: 4.4 g/dL (ref 3.5–5.2)
ALT: 14 U/L (ref 0–53)
AST: 19 U/L (ref 0–37)
Alkaline Phosphatase: 76 U/L (ref 39–117)
BILIRUBIN TOTAL: 0.9 mg/dL (ref 0.2–1.2)
Bilirubin, Direct: 0.1 mg/dL (ref 0.0–0.3)
TOTAL PROTEIN: 7.2 g/dL (ref 6.0–8.3)

## 2016-09-12 LAB — VITAMIN D 25 HYDROXY (VIT D DEFICIENCY, FRACTURES): VITD: 30.42 ng/mL (ref 30.00–100.00)

## 2016-09-12 LAB — TSH: TSH: 1.81 u[IU]/mL (ref 0.35–4.50)

## 2016-09-12 LAB — PSA: PSA: 2.78 ng/mL (ref 0.10–4.00)

## 2016-09-12 MED ORDER — ATORVASTATIN CALCIUM 10 MG PO TABS: 10.0000 mg | ORAL_TABLET | Freq: Every day | ORAL | 3 refills | Status: DC

## 2016-09-12 MED ORDER — MELOXICAM 15 MG PO TABS: 15.0000 mg | ORAL_TABLET | Freq: Every day | ORAL | 3 refills | Status: DC

## 2016-09-12 MED ORDER — CELECOXIB 200 MG PO CAPS: 200.0000 mg | ORAL_CAPSULE | Freq: Two times a day (BID) | ORAL | 3 refills | Status: DC

## 2016-09-12 MED ORDER — ZOLPIDEM TARTRATE 10 MG PO TABS: 10.0000 mg | ORAL_TABLET | Freq: Every day | ORAL | 1 refills | Status: DC

## 2016-09-12 MED ORDER — ASPIRIN 81 MG PO TBEC: 81.0000 mg | DELAYED_RELEASE_TABLET | Freq: Every day | ORAL | 12 refills | Status: DC

## 2016-09-12 NOTE — Patient Instructions (Addendum)
You had the flu shot today  Please make Nurse Visit appt for 2 wks to have the Prevnar 13 done  Please restart the aspirin at 81 mg per day, and the lipitor  Please continue all other medications as before, and refills have been done if requested. - the ambien, and the mobic  Please have the pharmacy call with any other refills you may need.  Please continue your efforts at being more active, low cholesterol diet, and weight control.  You are otherwise up to date with prevention measures today.  Please keep your appointments with your specialists as you may have planned  Please go to the LAB in the Basement (turn left off the elevator) for the tests to be done today  You will be contacted by phone if any changes need to be made immediately.  Otherwise, you will receive a letter about your results with an explanation, but please check with MyChart first.  Please remember to sign up for MyChart if you have not done so, as this will be important to you in the future with finding out test results, communicating by private email, and scheduling acute appointments online when needed.  Please return in 1 year for your yearly visit, or sooner if needed, with Lab testing done 3-5 days before

## 2016-09-12 NOTE — Progress Notes (Signed)
Subjective:    Patient ID: Jason Robles, male    DOB: 1945/06/13, 71 y.o.   MRN: [id number]  HPI  Here for wellness and f/u;  Overall doing ok;  Pt denies Chest pain, worsening SOB, DOE, wheezing, orthopnea, PND, worsening LE edema, palpitations, dizziness or syncope.  Pt denies neurological change such as new headache, facial or extremity weakness.  Pt denies polydipsia, polyuria, or low sugar symptoms. Pt states overall good compliance with treatment and medications, good tolerability, and has been trying to follow appropriate diet.  Pt denies worsening depressive symptoms, suicidal ideation or panic. No fever, night sweats, wt loss, loss of appetite, or other constitutional symptoms.  Pt states good ability with ADL's, has low fall risk, home safety reviewed and adequate, no other significant changes in hearing or vision, and occasionally active with exercise. Does have bilat knee pain and aching for several months, worse to stand all the time at work. Right face tingling and numbness with electrical pain about once per wk.   Also has occas left ant mid forearm fleeting pains Past Medical History:  Diagnosis Date  . ABDOMINAL PAIN OTHER SPECIFIED SITE 04/30/2008  . ALLERGIC RHINITIS 08/25/2007  . Anxiety 06/28/2010  . BENIGN PROSTATIC HYPERTROPHY 08/21/2006  . Chest pain 06/28/2010  . COLONIC POLYPS, HX OF 04/18/2007  . Complication of anesthesia   . 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 Diverticulosis of colon (without mention of hemorrhage) 04/18/2007  . EXTERNAL HEMORRHOIDS 04/18/2007  . Headache(784.0) 09/20/2009   hx migraines  . HEARING LOSS, 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  . HYPERLIPIDEMIA 08/21/2006  . INSOMNIA-SLEEP DISORDER-UNSPEC 09/20/2009  . NONSPECIFIC ABN FINDING RAD & OTH EXAM GU ORGAN 05/03/2008  . PERIPHERAL VASCULAR DISEASE 08/25/2007  . PONV (postoperative nausea and vomiting)   . Raynaud's syndrome 08/21/2006   Past Surgical History:  Procedure Laterality Date    . APPENDECTOMY    . s/p knee surgery Bilateral    x's 4 since 1980  . s/p left shoulder  2002   rotator cuff  . s/p lumbar disc surgery  2007  . 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 TOTAL KNEE ARTHROPLASTY Right 03/26/2016   Procedure: RIGHT TOTAL KNEE ARTHROPLASTY;  Surgeon: Vickey Huger, MD;  Location: [facility];  Service: Orthopedics;  Laterality: Right;  . 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   Procedure: TOTAL KNEE ARTHROPLASTY;  Surgeon: Vickey Huger, MD;  Location: [facility];  Service: Orthopedics;  Laterality: Left;    reports that he quit smoking about 36 years ago. His smoking use included Cigarettes. He has a 15.00 pack-year smoking history. He has never used smokeless tobacco. He reports that he drinks about 1.8 oz of alcohol per week . He reports that he does not use drugs. family history includes Alzheimer's disease in his mother; Diabetes in his maternal uncle and mother; Kidney cancer in his brother. Allergies  Allergen Reactions  . Iohexol Itching    Itching post 125cc Omni injection. Treated w/ 50 mg PO benedryl.  Onset Date: [id number]    No current outpatient prescriptions on file prior to visit.   No current facility-administered medications on file prior to visit.    Review of Systems Constitutional: Negative for other unusual diaphoresis, sweats, appetite or weight changes HENT: Negative for other worsening hearing loss, ear pain, facial swelling, mouth sores or neck stiffness.   Eyes: Negative for other worsening pain, redness or other visual disturbance.  Respiratory: Negative for other stridor or swelling Cardiovascular: Negative for  other palpitations or other chest pain  Gastrointestinal: Negative for worsening diarrhea or loose stools, blood in stool, distention or other pain Genitourinary: Negative for hematuria, flank pain or other change in urine volume.  Musculoskeletal: Negative for myalgias or other joint swelling.  Skin: Negative for other color  change, or other wound or worsening drainage.  Neurological: Negative for other syncope or numbness. Hematological: Negative for other adenopathy or swelling Psychiatric/Behavioral: Negative for hallucinations, other worsening agitation, SI, self-injury, or new decreased concentration All other system neg per pt    Objective:   Physical Exam BP 110/76   Pulse 77   Temp 98.2 F (36.8 C) (Oral)   Ht 5' 11.5" (1.816 m)   Wt 175 lb (79.4 kg)   SpO2 98%   BMI 24.07 kg/m  VS noted,  Constitutional: Pt is oriented to person, place, and time. Appears well-developed and well-nourished, in no significant distress and comfortable Head: Normocephalic and atraumatic  Eyes: Conjunctivae and EOM are normal. Pupils are equal, round, and reactive to light Right Ear: External ear normal without discharge Left Ear: External ear normal without discharge Nose: Nose without discharge or deformity Mouth/Throat: Oropharynx is without other ulcerations and moist  Neck: Normal range of motion. Neck supple. No JVD present. No tracheal deviation present or significant neck LA or mass Cardiovascular: Normal rate, regular rhythm, normal heart sounds and intact distal pulses.   Pulmonary/Chest: WOB normal and breath sounds without rales or wheezing  Abdominal: Soft. Bowel sounds are normal. NT. No HSM  Musculoskeletal: Normal range of motion. Exhibits no edema, has bilat deg changes Lymphadenopathy: Has no other cervical adenopathy.  Neurological: Pt is alert and oriented to person, place, and time. Pt has normal reflexes. No cranial nerve deficit. Motor grossly intact, Gait intact Skin: Skin is warm and dry. No rash noted or new ulcerations Psychiatric:  Has normal mood and affect. Behavior is normal without agitation No other exam findings     Assessment & Plan:

## 2016-09-13 LAB — HEPATITIS C ANTIBODY
HEP C AB: NONREACTIVE
SIGNAL TO CUT-OFF: 0.02 (ref <1.00)

## 2016-09-15 NOTE — Assessment & Plan Note (Signed)

## 2016-10-01 DIAGNOSIS — Z0101 Encounter for examination of eyes and vision with abnormal findings: Secondary | ICD-10-CM | POA: Diagnosis not present

## 2016-11-23 ENCOUNTER — Telehealth: Payer: Self-pay | Admitting: Internal Medicine

## 2016-11-26 NOTE — Telephone Encounter (Signed)
Just FYI: Pt called stating that he received a text from his pharmacy telling him that his medication was waiting for approval from the doctor. He said that he did not request this refill so was not sure why a request was sent over.

## 2016-11-26 NOTE — Telephone Encounter (Signed)
This must have been an error, as I did not approve the rx anyway; it was for (I think) [place] and he already a recent rx from late sept 2018 in place

## 2016-11-26 NOTE — Telephone Encounter (Signed)
Jason Robles too soon since last rx was for total 6 mo from sept 19 2018

## 2016-11-26 NOTE — Telephone Encounter (Signed)
Noted.  Dr. John FYI 

## 2016-12-20 ENCOUNTER — Other Ambulatory Visit: Payer: Self-pay | Admitting: Internal Medicine

## 2016-12-20 NOTE — Telephone Encounter (Signed)
Copied from [place]. Topic: Quick Communication - Rx Refill/Question >> Dec 20, 2016  9:30 AM Scherrie Gerlach wrote: Has the patient contacted their pharmacy?yes Pt states he was told there is a refill on his  zolpidem (AMBIEN) 10 MG tablet  But the pharmacy says there is not. Would like to know if you can resend?  [id number] [address], [place] [phone] (Phone) [phone] (Fax)

## 2016-12-20 NOTE — Telephone Encounter (Signed)
Called [facility] in regard to Ambien. States they never received refill in September. Given refill 90 pills no further refills.

## 2017-02-27 ENCOUNTER — Ambulatory Visit (INDEPENDENT_AMBULATORY_CARE_PROVIDER_SITE_OTHER): Payer: Medicare HMO | Admitting: Family

## 2017-02-27 ENCOUNTER — Ambulatory Visit: Payer: Self-pay

## 2017-02-27 ENCOUNTER — Other Ambulatory Visit (INDEPENDENT_AMBULATORY_CARE_PROVIDER_SITE_OTHER): Payer: Medicare HMO

## 2017-02-27 ENCOUNTER — Encounter: Payer: Self-pay | Admitting: Family

## 2017-02-27 ENCOUNTER — Other Ambulatory Visit: Payer: Self-pay | Admitting: Family

## 2017-02-27 VITALS — BP 118/70 | HR 71 | Temp 97.9°F | Ht 71.0 in | Wt 185.1 lb

## 2017-02-27 DIAGNOSIS — R410 Disorientation, unspecified: Secondary | ICD-10-CM

## 2017-02-27 DIAGNOSIS — R42 Dizziness and giddiness: Secondary | ICD-10-CM

## 2017-02-27 LAB — COMPREHENSIVE METABOLIC PANEL
ALT: 13 U/L (ref 0–53)
AST: 17 U/L (ref 0–37)
Albumin: 4 g/dL (ref 3.5–5.2)
Alkaline Phosphatase: 68 U/L (ref 39–117)
BUN: 17 mg/dL (ref 6–23)
CALCIUM: 9.7 mg/dL (ref 8.4–10.5)
CHLORIDE: 102 meq/L (ref 96–112)
CO2: 31 meq/L (ref 19–32)
Creatinine, Ser: 1.05 mg/dL (ref 0.40–1.50)
GFR: 74.27 mL/min (ref >60.00)
Glucose, Bld: 73 mg/dL (ref 70–99)
Potassium: 4.1 mEq/L (ref 3.5–5.1)
Sodium: 139 mEq/L (ref 135–145)
Total Bilirubin: 0.4 mg/dL (ref 0.2–1.2)
Total Protein: 6.9 g/dL (ref 6.0–8.3)

## 2017-02-27 LAB — CBC WITH DIFFERENTIAL/PLATELET
BASOS PCT: 0.6 % (ref 0.0–3.0)
Basophils Absolute: 0 10*3/uL (ref 0.0–0.1)
EOS PCT: 2.2 % (ref 0.0–5.0)
Eosinophils Absolute: 0.1 10*3/uL (ref 0.0–0.7)
HEMATOCRIT: 42.5 % (ref 39.0–52.0)
Hemoglobin: 14.4 g/dL (ref 13.0–17.0)
LYMPHS PCT: 33.1 % (ref 12.0–46.0)
Lymphs Abs: 1.9 10*3/uL (ref 0.7–4.0)
MCHC: 34 g/dL (ref 30.0–36.0)
MCV: 87.2 fl (ref 78.0–100.0)
MONOS PCT: 6.2 % (ref 3.0–12.0)
Monocytes Absolute: 0.3 10*3/uL (ref 0.1–1.0)
NEUTROS ABS: 3.3 10*3/uL (ref 1.4–7.7)
Neutrophils Relative %: 57.9 % (ref 43.0–77.0)
PLATELETS: 180 10*3/uL (ref 150.0–400.0)
RBC: 4.88 Mil/uL (ref 4.22–5.81)
RDW: 14.7 % (ref 11.5–15.5)
WBC: 5.6 10*3/uL (ref 4.0–10.5)

## 2017-02-27 LAB — VITAMIN B12: VITAMIN B 12: 234 pg/mL (ref 211–911)

## 2017-02-27 LAB — TSH: TSH: 1.35 u[IU]/mL (ref 0.35–4.50)
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
## 2017-02-28 ENCOUNTER — Telehealth: Payer: Self-pay | Admitting: Internal Medicine

## 2017-02-28 ENCOUNTER — Ambulatory Visit (INDEPENDENT_AMBULATORY_CARE_PROVIDER_SITE_OTHER)
Admission: RE | Admit: 2017-02-28 | Discharge: 2017-02-28 | Disposition: A | Discharge status: Home / Self Care | Payer: Medicare HMO | Source: Ambulatory Visit | Attending: Family | Admitting: Family

## 2017-02-28 DIAGNOSIS — R4020 Unspecified coma: Secondary | ICD-10-CM | POA: Diagnosis not present

## 2017-02-28 DIAGNOSIS — R410 Disorientation, unspecified: Secondary | ICD-10-CM

## 2017-02-28 NOTE — Telephone Encounter (Signed)
This patient was seen yesterday by Mickel Baas. Please reach out to the patient as I am unaware of what was discussed or why a lipid panel was not done. Thanks!

## 2017-02-28 NOTE — Telephone Encounter (Signed)
Spoke with patient today and informed him that Lipid panel wasn't ordered and last Lipid panel was done back in Sept when he saw Dr.John. Asked patient if he would still like to have his cholesterol checked and he stated he was fine. Advised patient if he changed his mind, he could call us back and I could have Jason Robles put in lab order and we would call with results.

## 2017-02-28 NOTE — Telephone Encounter (Signed)
Copied from [place]. Topic: Quick Communication - Lab Results >> Feb 28, 2017  9:41 AM Cecelia Byars, NT wrote: Patient   called and would like his results from having his cholesterol tested please call him with the information at  [phone] He says he was given his other lab results  already

## 2017-03-04 ENCOUNTER — Ambulatory Visit (HOSPITAL_COMMUNITY)
Admission: RE | Admit: 2017-03-04 | Discharge: 2017-03-04 | Disposition: A | Discharge status: Home / Self Care | Payer: Medicare HMO | Source: Ambulatory Visit | Attending: Cardiology | Admitting: Cardiology

## 2017-03-04 DIAGNOSIS — I6523 Occlusion and stenosis of bilateral carotid arteries: Secondary | ICD-10-CM | POA: Diagnosis not present

## 2017-03-04 DIAGNOSIS — Z87891 Personal history of nicotine dependence: Secondary | ICD-10-CM | POA: Insufficient documentation

## 2017-03-04 DIAGNOSIS — Z8673 Personal history of transient ischemic attack (TIA), and cerebral infarction without residual deficits: Secondary | ICD-10-CM | POA: Diagnosis not present

## 2017-03-04 DIAGNOSIS — E785 Hyperlipidemia, unspecified: Secondary | ICD-10-CM | POA: Diagnosis not present

## 2017-03-04 DIAGNOSIS — R42 Dizziness and giddiness: Secondary | ICD-10-CM | POA: Diagnosis not present

## 2017-03-06 ENCOUNTER — Other Ambulatory Visit: Payer: Self-pay | Admitting: Family

## 2017-03-06 DIAGNOSIS — I6523 Occlusion and stenosis of bilateral carotid arteries: Secondary | ICD-10-CM

## 2017-03-12 ENCOUNTER — Other Ambulatory Visit: Payer: Self-pay | Admitting: Internal Medicine

## 2017-03-13 NOTE — Telephone Encounter (Signed)
Done erx 

## 2017-04-01 ENCOUNTER — Ambulatory Visit (INDEPENDENT_AMBULATORY_CARE_PROVIDER_SITE_OTHER): Payer: Medicare HMO | Admitting: Internal Medicine

## 2017-04-01 ENCOUNTER — Encounter: Payer: Self-pay | Admitting: Internal Medicine

## 2017-04-01 VITALS — BP 118/78 | HR 68 | Temp 97.6°F | Ht 71.0 in | Wt 176.0 lb

## 2017-04-01 DIAGNOSIS — M545 Low back pain, unspecified: Secondary | ICD-10-CM

## 2017-04-01 MED ORDER — CYCLOBENZAPRINE HCL 5 MG PO TABS: 5.0000 mg | ORAL_TABLET | Freq: Three times a day (TID) | ORAL | 1 refills | Status: DC | PRN

## 2017-04-01 MED ORDER — TRAMADOL HCL 50 MG PO TABS: 50.0000 mg | ORAL_TABLET | Freq: Four times a day (QID) | ORAL | 0 refills | Status: DC | PRN

## 2017-04-01 NOTE — Progress Notes (Signed)
Subjective:    Patient ID: Jason Robles, male    DOB: [birth year], 72 y.o.   MRN: [id number]  HPI  Here after just back from [place], a trip including several episodes of lifting luggage at awkward angles out of small cars.   Now with acute onset Left lower back, sharp, constant, worse to stand up or twist or turn, Usually doe not radiate but did have left upper leg pain for a short time recently for a few hours one evening a few days ago. No clear radicular pain or weakness. No falls. Denies urinary symptoms such as dysuria, frequency, urgency, flank pain, hematuria or n/v, fever, chills.  Denies worsening reflux, abd pain, dysphagia, n/v, bowel change or blood. Pain overall about 7/10 worse after sitting longer periods and has to lean to the right in chair to take pressure off the left lower back Past Medical History:  Diagnosis Date  . ABDOMINAL PAIN OTHER SPECIFIED SITE 04/30/2008  . ALLERGIC RHINITIS 08/25/2007  . Anxiety 06/28/2010  . BENIGN PROSTATIC HYPERTROPHY 08/21/2006  . Chest pain 06/28/2010  . COLONIC POLYPS, HX OF 04/18/2007  . Complication of anesthesia   . 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 Diverticulosis of colon (without mention of hemorrhage) 04/18/2007  . EXTERNAL HEMORRHOIDS 04/18/2007  . Headache(784.0) 09/20/2009   hx migraines  . HEARING LOSS, 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  . HYPERLIPIDEMIA 08/21/2006  . INSOMNIA-SLEEP DISORDER-UNSPEC 09/20/2009  . NONSPECIFIC ABN FINDING RAD & OTH EXAM GU ORGAN 05/03/2008  . PERIPHERAL VASCULAR DISEASE 08/25/2007  . PONV (postoperative nausea and vomiting)   . Raynaud's syndrome 08/21/2006   Past Surgical History:  Procedure Laterality Date  . APPENDECTOMY    . s/p knee surgery Bilateral    x's 4 since 1980  . s/p left shoulder  2002   rotator cuff  . s/p lumbar disc surgery  2007  . 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 TOTAL KNEE ARTHROPLASTY Right 03/26/2016   Procedure: RIGHT TOTAL KNEE ARTHROPLASTY;   Surgeon: Vickey Huger, MD;  Location: [facility];  Service: Orthopedics;  Laterality: Right;  . 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 His smoking use included cigarettes. He has a 15.00 pack-year smoking history. He has never used smokeless tobacco. He reports that he drinks about 1.8 oz of alcohol per week. He reports that he does not use drugs. family history includes Alzheimer's disease in his mother; Diabetes in his maternal uncle and mother; Kidney cancer in his brother. Allergies  Allergen Reactions  . Iohexol Itching    Itching post 125cc Omni injection. Treated w/ 50 mg PO benedryl.  Onset Date: 27741287    Current Outpatient Medications on File Prior to Visit  Medication Sig Dispense Refill  . aspirin 81 MG EC tablet Take 1 tablet (81 mg total) by mouth daily. Swallow whole. 30 tablet 12  . atorvastatin (LIPITOR) 10 MG tablet Take 1 tablet (10 mg total) by mouth daily. 90 tablet 3  . meloxicam (MOBIC) 15 MG tablet Take 1 tablet (15 mg total) by mouth daily. 90 tablet 3  . zolpidem (AMBIEN) 10 MG tablet TAKE 1 TABLET BY MOUTH AT BEDTIME 90 tablet 1   No current facility-administered medications on file prior to visit.    Review of Systems . Constitutional: Negative for other unusual diaphoresis  or sweats HENT: Negative for ear discharge or swelling Eyes: Negative for other worsening visual disturbances Respiratory: Negative for stridor or other swelling  Gastrointestinal: Negative for worsening distension or other blood Genitourinary: Negative for retention or other urinary change Musculoskeletal: Negative for other MSK pain or swelling Skin: Negative for color change or other new lesions Neurological: Negative for worsening tremors and other numbness  Psychiatric/Behavioral: Negative for worsening agitation  or other fatigue All other system neg per pt    Objective:   Physical Exam BP 118/78   Pulse 68   Temp 97.6 F (36.4 C) (Oral)   Ht 5\' 11"  (1.803 m)   Wt 176 lb (79.8 kg)   SpO2 96%   BMI 24.55 kg/m  VS noted,  Constitutional: Pt appears in NAD HENT: Head: NCAT.  Right Ear: External ear normal.  Left Ear: External ear normal.  Eyes: . Pupils are equal, round, and reactive to light. Conjunctivae and EOM are normal Nose: without d/c or deformity Neck: Neck supple. Gross normal ROM Cardiovascular: Normal rate and regular rhythm.   Pulmonary/Chest: Effort normal and breath sounds without rales or wheezing.  Abd:  Soft, NT, ND, + BS, no organomegaly Spine nontender; + left lumbar paravertebral tender spasm Neurological: Pt is alert. At baseline orientation, motor grossly intact Skin: Skin is warm. No rashes, other new lesions, no LE edema Psychiatric: Pt behavior is normal without agitation  No other exam findings    Assessment & Plan:

## 2017-04-01 NOTE — Patient Instructions (Addendum)
Please take all new medication as prescribed - the pain medication and muscle relaxer as needed  Please continue all other medications as before, and refills have been done if requested.  Please have the pharmacy call with any other refills you may need.  Please keep your appointments with your specialists as you may have planned

## 2017-04-02 DIAGNOSIS — M545 Low back pain, unspecified: Secondary | ICD-10-CM | POA: Insufficient documentation

## 2017-04-02 HISTORY — DX: Low back pain, unspecified: M54.50

## 2017-04-02 NOTE — Assessment & Plan Note (Signed)
Pain c/w msk spasm, for pain control and muscle relaxer prn,  to f/u any worsening symptoms or concerns

## 2017-04-08 ENCOUNTER — Encounter: Payer: Medicare HMO | Admitting: Surgery

## 2017-04-08 ENCOUNTER — Telehealth: Payer: Self-pay

## 2017-04-08 DIAGNOSIS — M545 Low back pain: Secondary | ICD-10-CM

## 2017-04-08 NOTE — Telephone Encounter (Signed)
I think we should try to be seen per Sports Medicine - I will do the referral, or pt can be transferred to scheduling for an appt

## 2017-04-08 NOTE — Telephone Encounter (Signed)
Copied from [place] [id number]. Topic: Quick Communication - See Telephone Encounter >> Apr 08, 2017  8:35 AM Antonieta Iba C wrote: CRM for notification. See Telephone encounter for: 04/08/17.  Pt called in to make provider aware that he is still having pain. Pt says that he has already been seen by provider. Pt would like to be advised further.   CB: [phone]  >> Apr 08, 2017  8:41 AM Para Skeans A wrote: I don't see a TE about this?   LOV : 04/01/17

## 2017-04-08 NOTE — Addendum Note (Signed)
Addended by: Biagio Borg on: 04/08/2017 12:24 PM   Modules accepted: Orders

## 2017-04-09 ENCOUNTER — Encounter: Payer: Self-pay | Admitting: Vascular Surgery

## 2017-04-09 ENCOUNTER — Ambulatory Visit: Payer: Medicare HMO | Admitting: Vascular Surgery

## 2017-04-09 ENCOUNTER — Other Ambulatory Visit: Payer: Self-pay

## 2017-04-09 VITALS — BP 125/81 | HR 62 | Resp 20 | Ht 71.0 in | Wt 181.5 lb

## 2017-04-09 DIAGNOSIS — F05 Delirium due to known physiological condition: Secondary | ICD-10-CM

## 2017-04-09 DIAGNOSIS — R69 Illness, unspecified: Secondary | ICD-10-CM | POA: Diagnosis not present

## 2017-04-09 NOTE — Progress Notes (Signed)
[facility]ist of [facility]  Patient name: Jason Robles MRN: [id number] DOB: 1945-09-18 Sex: male  REASON FOR CONSULT: Evaluation of carotid duplex  HPI: Jason Robles is a 72 y.o. male, who is here today for discussion of recent carotid duplex.  He is here today with his wife.  He had an episode approximately 6 weeks ago when he was driving and had some confusion where he was unclear as to his destination although he had been there many times.  He made a delivery and then continued to have some confusion on his next stop and then had complete resolution.  He had a CT scan at that time showing age-appropriate changes and no acute issues.  Also underwent carotid duplex evaluation and is here today for discussion of this study.  He specifically denies any focal deficits during this time.  No amaurosis fugax, transient ischemic attack or stroke.  He did have an event in 2007 where he had a episode of loss of consciousness and was discovered by his wife.  He reports that he was admitted for 3 days and underwent extensive workup which was completely negative.  He does report that over the last 11 years he has had some blurry vision in his left eye that happened 4 or 5 times and is been worked up by ophthalmology showing no change.  Past Medical History:  Diagnosis Date  . ABDOMINAL PAIN OTHER SPECIFIED SITE 04/30/2008  . ALLERGIC RHINITIS 08/25/2007  . Anxiety 06/28/2010  . BENIGN PROSTATIC HYPERTROPHY 08/21/2006  . Chest pain 06/28/2010  . COLONIC POLYPS, HX OF 04/18/2007  . Complication of anesthesia   . 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 Diverticulosis of colon (without mention of hemorrhage) 04/18/2007  . EXTERNAL HEMORRHOIDS 04/18/2007  . Headache(784.0) 09/20/2009   hx migraines  . HEARING LOSS, 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  . HYPERLIPIDEMIA 08/21/2006  . INSOMNIA-SLEEP DISORDER-UNSPEC 09/20/2009  . NONSPECIFIC ABN FINDING  RAD & OTH EXAM GU ORGAN 05/03/2008  . PERIPHERAL VASCULAR DISEASE 08/25/2007  . PONV (postoperative nausea and vomiting)   . Raynaud's syndrome 08/21/2006    Family History  Problem Relation Age of Onset  . Diabetes Mother   . Alzheimer's disease Mother   . Kidney cancer Brother   . Diabetes Maternal Uncle     SOCIAL HISTORY: Social History   Socioeconomic History  . Marital status: Married    Spouse name: Not on file  . Number of children: Not on file  . Years of education: Not on file  . Highest education level: Not on file  Occupational History  . Occupation: self employed Tax inspector   . Occupation: retired Medical sales representative  . Financial resource strain: Not on file  . Food insecurity:    Worry: Not on file    Inability: Not on file  . Transportation needs:    Medical: Not on file    Non-medical: Not on file  Tobacco Use  . Smoking status: Former Smoker    Packs/day: 1.00    Years: 15.00    Pack years: 15.00    Types: Cigarettes    Last attempt to quit: 01/31/1980    Years since quitting: 37.2  . Smokeless tobacco: Never Used  Substance and Sexual Activity  . Alcohol use: Yes    Alcohol/week: 1.8 oz    Types: 3 Cans of beer per week  . Drug use: No  . Sexual activity: Yes  Lifestyle  .  Physical activity:    Days per week: Not on file    Minutes per session: Not on file  . Stress: Not on file  Relationships  . Social connections:    Talks on phone: Not on file    Gets together: Not on file    Attends religious service: Not on file    Active member of club or organization: Not on file    Attends meetings of clubs or organizations: Not on file    Relationship status: Not on file  . Intimate partner violence:    Fear of current or ex partner: Not on file    Emotionally abused: Not on file    Physically abused: Not on file    Forced sexual activity: Not on file  Other Topics Concern  . Not on file  Social History Narrative   [place]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    Allergies  Allergen Reactions  . Iohexol Itching    Itching post 125cc Omni injection. Treated w/ 50 mg PO benedryl.  Onset Date: 58099833     Current Outpatient Medications  Medication Sig Dispense Refill  . aspirin 81 MG EC tablet Take 1 tablet (81 mg total) by mouth daily. Swallow whole. 30 tablet 12  . atorvastatin (LIPITOR) 10 MG tablet Take 1 tablet (10 mg total) by mouth daily. 90 tablet 3  . cyclobenzaprine (FLEXERIL) 5 MG tablet Take 1 tablet (5 mg total) by mouth 3 (three) times daily as needed for muscle spasms. 40 tablet 1  . meloxicam (MOBIC) 15 MG tablet Take 1 tablet (15 mg total) by mouth daily. 90 tablet 3  . traMADol (ULTRAM) 50 MG tablet Take 1 tablet (50 mg total) by mouth every 6 (six) hours as needed. 30 tablet 0  . zolpidem (AMBIEN) 10 MG tablet TAKE 1 TABLET BY MOUTH AT BEDTIME 90 tablet 1   No current facility-administered medications for this visit.     REVIEW OF SYSTEMS:  [X]  denotes positive finding, [ ]  denotes negative finding Cardiac  Comments:  Chest pain or chest pressure:    Shortness of breath upon exertion:    Short of breath when lying flat: x   Irregular heart rhythm:        Vascular    Pain in calf, thigh, or hip brought on by ambulation:    Pain in feet at night that wakes you up from your sleep:     Blood clot in your veins:    Leg swelling:         Pulmonary    Oxygen at home:    Productive cough:     Wheezing:         Neurologic    Sudden weakness in arms or legs:     Sudden numbness in arms or legs:     Sudden onset of difficulty speaking or slurred speech:    Temporary loss of vision in one eye:     Problems with dizziness:         Gastrointestinal    Blood in stool:     Vomited blood:         Genitourinary    Burning when urinating:     Blood in urine:        Psychiatric    Major depression:         Hematologic    Bleeding problems:    Problems with blood clotting too easily:          Skin  Rashes or ulcers:        Constitutional    Fever or chills:      PHYSICAL EXAM: Vitals:   04/09/17 0920 04/09/17 0921  BP: 132/80 125/81  Pulse: 62   Resp: 20   SpO2: 99%   Weight: 181 lb 8 oz (82.3 kg)   Height: 5\' 11"  (1.803 m)     GENERAL: The patient is a well-nourished male, in no acute distress. The vital signs are documented above. CARDIOVASCULAR: Carotid arteries without bruits.  2+ radial 2+ femoral and 2+ posterior tibial pulses PULMONARY: There is good air exchange  ABDOMEN: Soft and non-tender  MUSCULOSKELETAL: There are no major deformities or cyanosis. NEUROLOGIC: No focal weakness or paresthesias are detected. SKIN: There are no ulcers or rashes noted. PSYCHIATRIC: The patient has a normal affect.  DATA:  Carotid duplex shows minimal changes of atherosclerosis with 1-39% predicted stenoses bilaterally with no significant elevation in his velocities  MEDICAL ISSUES: I discussed these findings with the patient and his wife present.  Would not recommend any further workup or follow-up regarding his carotid disease.  Explained that this is essentially normal study for a 72 year old gentleman.  He had no focal neurologic deficits with this recent event.  We will continue follow-up with primary care regarding any further evaluation of this recent episode of confusion.  Will see [place] again as needed   Rosetta Posner, MD [facility] V[facility] and Vein Specialists of [facility] Tel [phone] Pager [phone]

## 2017-04-15 NOTE — Progress Notes (Signed)
[facility] [place], [place] [address] Phone: [phone] Subjective:    I'm seeing this patient by the request  of:  Biagio Borg, MD   CC: Back pain  [id number]  Jason Robles is a 72 y.o. male coming in with complaint of low back pain. Was pulling a suitcase out the car when he felt pain. Getting better. Had both knees replaced last year. Left leg more numb then right. Concerned with neck popping.   Onset- March 27th Location- Left hip Character- Achy, sharp Aggravating factors- sitting up straight, bending over Reliving factors- Bio freeze Severity-7 out of 10 in severity   Patient does have x-rays from 2017 showing intraoperative x-ray left L4-L5 microdiscectomy.  Past Medical History:  Diagnosis Date  . ABDOMINAL PAIN OTHER SPECIFIED SITE 04/30/2008  . ALLERGIC RHINITIS 08/25/2007  . Anxiety 06/28/2010  . BENIGN PROSTATIC HYPERTROPHY 08/21/2006  . Chest pain 06/28/2010  . COLONIC POLYPS, HX OF 04/18/2007  . Complication of anesthesia   . 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 Diverticulosis of colon (without mention of hemorrhage) 04/18/2007  . EXTERNAL HEMORRHOIDS 04/18/2007  . Headache(784.0) 09/20/2009   hx migraines  . HEARING LOSS, 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  . HYPERLIPIDEMIA 08/21/2006  . INSOMNIA-SLEEP DISORDER-UNSPEC 09/20/2009  . NONSPECIFIC ABN FINDING RAD & OTH EXAM GU ORGAN 05/03/2008  . PERIPHERAL VASCULAR DISEASE 08/25/2007  . PONV (postoperative nausea and vomiting)   . Raynaud's syndrome 08/21/2006   Past Surgical History:  Procedure Laterality Date  . APPENDECTOMY    . s/p knee surgery Bilateral    x's 4 since 1980  . s/p left shoulder  2002   rotator cuff  . s/p lumbar disc surgery  2007  . 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 TOTAL KNEE ARTHROPLASTY Right 03/26/2016   Procedure: RIGHT TOTAL KNEE ARTHROPLASTY;  Surgeon: Vickey Huger, MD;  Location: [facility];  Service: Orthopedics;   Laterality: Right;  . 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 Marital status: Married    Spouse name: Not on file  . Number of children: Not on file  . Years of education: Not on file  . Highest education level: Not on file  Occupational History  . Occupation: self employed Tax inspector   . Occupation: retired Medical sales representative  . Financial resource strain: Not on file  . Food insecurity:    Worry: Not on file    Inability: Not on file  . Transportation needs:    Medical: Not on file    Non-medical: Not on file  Tobacco Use  . Smoking status: Former Smoker    Packs/day: 1.00    Years: 15.00    Pack years: 15.00    Types: Cigarettes    Last attempt to quit: 01/31/1980    Years since quitting: 37.2  . Smokeless tobacco: Never Used  Substance and Sexual Activity  . Alcohol use: Yes    Alcohol/week: 1.8 oz    Types: 3 Cans of beer per week  . Drug use: No  . Sexual activity: Yes  Lifestyle  . Physical activity:    Days per week: Not on file    Minutes per session: Not on file  . Stress: Not on file  Relationships  . Social connections:  Talks on phone: Not on file    Gets together: Not on file    Attends religious service: Not on file    Active member of club or organization: Not on file    Attends meetings of clubs or organizations: Not on file    Relationship status: Not on file  Other Topics Concern  . Not on file  Social History Narrative   [place]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   Allergies  Allergen Reactions  . Iohexol Itching    Itching post 125cc Omni injection. Treated w/ 50 mg PO benedryl.  Onset Date: [id number]    Family History  Problem Relation Age of Onset  . Diabetes Mother   . Alzheimer's disease Mother   . Kidney cancer Brother   . Diabetes Maternal Uncle       Past medical history, social, surgical and family history all reviewed in electronic medical record.  No pertanent information unless stated regarding to the chief complaint.   Review of Systems:Review of systems updated and as accurate as of 04/16/17  No headache, visual changes, nausea, vomiting, diarrhea, constipation, dizziness, abdominal pain, skin rash, fevers, chills, night sweats, weight loss, swollen lymph nodes, body aches, joint swelling, chest pain, shortness of breath, mood changes.  Positive muscle aches  Objective  Blood pressure 132/74, pulse (!) 59, height 5\' 11"  (1.803 m), weight 183 lb (83 kg), SpO2 98 %. Systems examined below as of 04/16/17   General: No apparent distress alert and oriented x3 mood and affect normal, dressed appropriately.  HEENT: Pupils equal, extraocular movements intact  Respiratory: Patient's speak in full sentences and does not appear short of breath  Cardiovascular: No lower extremity edema, non tender, no erythema  Skin: Warm dry intact with no signs of infection or rash on extremities or on axial skeleton.  Abdomen: Soft nontender  Neuro: Cranial nerves II through XII are intact, neurovascularly intact in all extremities with 2+ DTRs and 2+ pulses.  Lymph: No lymphadenopathy of posterior or anterior cervical chain or axillae bilaterally.  Gait normal with good balance and coordination.  MSK:  Non tender with full range of motion and good stability and symmetric strength and tone of shoulders, elbows, wrist, hip, knee and ankles bilaterally.  Back Exam:  Inspection: Loss of lordosis Motion: Flexion 45 deg, Extension 20 deg, Side Bending to 30 deg bilaterally, Rotation to 35 deg bilaterally  SLR laying: Mild positive on the left side XSLR laying: Negative  Palpable tenderness: Tender to palpation in the paraspinal musculature lumbar spine.[name] FABER: negative. Sensory change: Gross sensation intact to all lumbar and sacral dermatomes.   Reflexes: 2+ at both patellar tendons, 2+ at achilles tendons, Babinski's downgoing.  Strength at foot  Plantar-flexion: 5/5 Dorsi-flexion: 5/5 Eversion: 5/5 Inversion: 5/5  Leg strength  Quad: 5/5 Hamstring: 5/5 Hip flexor: 5/5 Hip abductors: 5/5  Gait unremarkable.  [id number]; 15 additional minutes spent for Therapeutic exercises as stated in above notes.  This included exercises focusing on stretching, strengthening, with significant focus on eccentric aspects.   Long term goals include an improvement in range of motion, strength, endurance as well as avoiding reinjury. Patient's frequency would include in 1-2 times a day, 3-5 times a week for a duration of 6-12 weeks. Low back exercises that included:  Pelvic tilt/bracing instruction to focus on control of the pelvic girdle and lower abdominal muscles  Glute strengthening exercises, focusing on proper firing of the glutes without engaging the low back muscles Proper stretching techniques for maximum relief for  the hamstrings, hip flexors, low back and some rotation where tolerated   Proper technique shown and discussed handout in great detail with ATC.  All questions were discussed and answered.     Impression and Recommendations:     This case required medical decision making of moderate complexity.      Note: This dictation was prepared with Dragon dictation along with smaller phrase technology. Any transcriptional errors that result from this process are unintentional.

## 2017-04-16 ENCOUNTER — Encounter: Payer: Self-pay | Admitting: Family Medicine

## 2017-04-16 ENCOUNTER — Ambulatory Visit: Payer: Medicare HMO | Admitting: Family Medicine

## 2017-04-16 DIAGNOSIS — M5416 Radiculopathy, lumbar region: Secondary | ICD-10-CM

## 2017-04-16 HISTORY — DX: Radiculopathy, lumbar region: M54.16

## 2017-04-16 MED ORDER — GABAPENTIN 100 MG PO CAPS: 200.0000 mg | ORAL_CAPSULE | Freq: Every day | ORAL | 3 refills | Status: DC

## 2017-04-16 NOTE — Patient Instructions (Signed)
Great to meet you  Ice 20 minutes 2 times daily. Usually after activity and before bed. Exercises 3 times a week.  Gabapentin 200mg  at night Vitamin D 2000 IU daily  Turmeric 500mg  daily  Tart cherry extract 1200mg  at night OK to lift but start 50% of what you were doing and increase 10% a week.  Keep hands within peripheral vision.  See me again in 4-5 weeks

## 2017-04-16 NOTE — Assessment & Plan Note (Signed)
Patient does have more of a lumbar radiculopathy.  Seems to be doing of the left leg.  Corresponding with patient previous microdiscectomy and adjacent segment disease.  Gabapentin.  Discussed icing regimen and home exercises.  Discussed which activities to do which wants to avoid.  Follow-up again in 4-6 weeks

## 2017-05-08 ENCOUNTER — Other Ambulatory Visit: Payer: Self-pay | Admitting: Family Medicine

## 2017-05-08 ENCOUNTER — Other Ambulatory Visit: Payer: Self-pay | Admitting: Internal Medicine

## 2017-05-08 NOTE — Telephone Encounter (Signed)
Refill done.  

## 2017-05-09 NOTE — Telephone Encounter (Signed)
12/20/2016  90# 

## 2017-05-09 NOTE — Telephone Encounter (Signed)
Lorrin Mais too soon - still should have 1 refill for #90

## 2017-05-17 ENCOUNTER — Ambulatory Visit: Payer: Medicare HMO | Admitting: Internal Medicine

## 2017-06-19 ENCOUNTER — Other Ambulatory Visit: Payer: Self-pay | Admitting: Internal Medicine

## 2017-06-19 NOTE — Telephone Encounter (Signed)
Done erx 

## 2017-09-05 ENCOUNTER — Other Ambulatory Visit: Payer: Self-pay | Admitting: Internal Medicine

## 2018-02-04 ENCOUNTER — Encounter: Payer: Self-pay | Admitting: Gastroenterology

## 2018-02-18 ENCOUNTER — Encounter: Payer: Self-pay | Admitting: Gastroenterology

## 2018-02-18 ENCOUNTER — Ambulatory Visit (AMBULATORY_SURGERY_CENTER): Payer: Self-pay | Admitting: *Deleted

## 2018-02-18 VITALS — Ht 72.0 in | Wt 183.0 lb

## 2018-02-18 DIAGNOSIS — Z1211 Encounter for screening for malignant neoplasm of colon: Secondary | ICD-10-CM

## 2018-02-18 MED ORDER — NA SULFATE-K SULFATE-MG SULF 17.5-3.13-1.6 GM/177ML PO SOLN: ORAL | 0 refills | Status: DC

## 2018-02-18 NOTE — Progress Notes (Signed)
Patient denies any allergies to eggs or soy. Patient does have post-op nausea and vomiting and wakes up during surgeries. Patient denies any problems with anesthesia/sedation. Patient denies any oxygen use at home. Patient denies taking any diet/weight loss medications or blood thinners. EMMI education assisgned to patient on colonoscopy, this was explained and instructions given to patient.

## 2018-02-25 ENCOUNTER — Telehealth: Payer: Self-pay | Admitting: Gastroenterology

## 2018-02-25 NOTE — Telephone Encounter (Signed)
Informed pt he will not need antibiotic before the colon as its a low risk procedure- Pt states he has never been told he needed the antibiotic he was just checking. Lelan Pons PV

## 2018-02-25 NOTE — Telephone Encounter (Signed)
Pt is sched for 3.2.20 colonoscopy.  Pt reported that he had knee replacement surgery two years ago.  Pt inquired if he is required to take antibiotics before colon.

## 2018-02-28 ENCOUNTER — Telehealth: Payer: Self-pay | Admitting: Gastroenterology

## 2018-02-28 NOTE — Telephone Encounter (Signed)
Called Patient back and left message stating he was able to take his normal daily medications the morning of his procedure as long as he takes them before his cutoff. Also advised him to call me directly if he had any questions.

## 2018-03-03 ENCOUNTER — Encounter: Payer: Self-pay | Admitting: Gastroenterology

## 2018-03-03 ENCOUNTER — Ambulatory Visit (AMBULATORY_SURGERY_CENTER): Payer: Medicare HMO | Admitting: Gastroenterology

## 2018-03-03 VITALS — BP 112/68 | HR 63 | Temp 96.2°F | Resp 21 | Ht 71.0 in | Wt 183.0 lb

## 2018-03-03 DIAGNOSIS — K635 Polyp of colon: Secondary | ICD-10-CM

## 2018-03-03 DIAGNOSIS — D125 Benign neoplasm of sigmoid colon: Secondary | ICD-10-CM

## 2018-03-03 DIAGNOSIS — Z1211 Encounter for screening for malignant neoplasm of colon: Secondary | ICD-10-CM

## 2018-03-03 MED ORDER — SODIUM CHLORIDE 0.9 % IV SOLN: 500.0000 mL | Freq: Once | INTRAVENOUS | Status: DC

## 2018-03-03 NOTE — Patient Instructions (Signed)
Handouts:  Polyps, Hemorrhoids, and Diverticulosis.  Need to make another appointment for the procedure again at your convenience.  YOU HAD AN ENDOSCOPIC PROCEDURE TODAY AT [facility]:   Refer to the procedure report that was given to you for any specific questions about what was found during the examination.  If the procedure report does not answer your questions, please call your gastroenterologist to clarify.  If you requested that your care partner not be given the details of your procedure findings, then the procedure report has been included in a sealed envelope for you to review at your convenience later.  YOU SHOULD EXPECT: Some feelings of bloating in the abdomen. Passage of more gas than usual.  Walking can help get rid of the air that was put into your GI tract during the procedure and reduce the bloating. If you had a lower endoscopy (such as a colonoscopy or flexible sigmoidoscopy) you may notice spotting of blood in your stool or on the toilet paper. If you underwent a bowel prep for your procedure, you may not have a normal bowel movement for a few days.  Please Note:  You might notice some irritation and congestion in your nose or some drainage.  This is from the oxygen used during your procedure.  There is no need for concern and it should clear up in a day or so.  SYMPTOMS TO REPORT IMMEDIATELY:   Following lower endoscopy (colonoscopy or flexible sigmoidoscopy):  Excessive amounts of blood in the stool  Significant tenderness or worsening of abdominal pains  Swelling of the abdomen that is new, acute  Fever of 100F or higher  For urgent or emergent issues, a gastroenterologist can be reached at any hour by calling [phone].   DIET:  We do recommend a small meal at first, but then you may proceed to your regular diet.  Drink plenty of fluids but you should avoid alcoholic beverages for 24 hours.  ACTIVITY:  You should plan to take it easy for the  rest of today and you should NOT DRIVE or use heavy machinery until tomorrow (because of the sedation medicines used during the test).    FOLLOW UP: Our staff will call the number listed on your records the next business day following your procedure to check on you and address any questions or concerns that you may have regarding the information given to you following your procedure. If we do not reach you, we will leave a message.  However, if you are feeling well and you are not experiencing any problems, there is no need to return our call.  We will assume that you have returned to your regular daily activities without incident.  If any biopsies were taken you will be contacted by phone or by letter within the next 1-3 weeks.  Please call us at [phone] if you have not heard about the biopsies in 3 weeks.    SIGNATURES/CONFIDENTIALITY: You and/or your care partner have signed paperwork which will be entered into your electronic medical record.  These signatures attest to the fact that that the information above on your After Visit Summary has been reviewed and is understood.  Full responsibility of the confidentiality of this discharge information lies with you and/or your care-partner.

## 2018-03-03 NOTE — Progress Notes (Signed)
Pt's states no medical or surgical changes since previsit or office visit. 

## 2018-03-03 NOTE — Op Note (Signed)
[facility] Patient Name: Jason Robles Procedure Date: 03/03/2018 8:31 AM MRN: [id number] Endoscopist: Remo Lipps P. Havery Moros , MD Age: 73 Referring MD:  Date of Birth: Jan 18, 1945 Gender: Male Account #: [id number] Procedure:                Colonoscopy Indications:              Screening for colorectal malignant neoplasm Medicines:                Monitored Anesthesia Care Procedure:                Pre-Anesthesia Assessment:                           - Prior to the procedure, a History and Physical                            was performed, and patient medications and                            allergies were reviewed. The patient's tolerance of                            previous anesthesia was also reviewed. The risks                            and benefits of the procedure and the sedation                            options and risks were discussed with the patient.                            All questions were answered, and informed consent                            was obtained. Prior Anticoagulants: The patient has                            taken no previous anticoagulant or antiplatelet                            agents. ASA Grade Assessment: II - A patient with                            mild systemic disease. After reviewing the risks                            and benefits, the patient was deemed in                            satisfactory condition to undergo the procedure.                           After obtaining informed consent, the colonoscope  was passed under direct vision. Throughout the                            procedure, the patient's blood pressure, pulse, and                            oxygen saturations were monitored continuously. The                            Colonoscope was introduced through the anus and                            advanced to the the cecum, identified by                            appendiceal orifice  and ileocecal valve. The                            colonoscopy was performed without difficulty. The                            patient tolerated the procedure well. The quality                            of the bowel preparation was fair. The ileocecal                            valve, appendiceal orifice, and rectum were                            photographed. Scope In: 8:36:59 AM Scope Out: 8:48:28 AM Scope Withdrawal Time: 0 hours 5 minutes 51 seconds  Total Procedure Duration: 0 hours 11 minutes 29 seconds  Findings:                 The perianal and digital rectal examinations were                            normal.                           A large amount of liquid stool was found in the                            entire colon, worst in right colon, making                            visualization difficult. Lavage of the area was                            performed using copious amounts of sterile water,                            resulting in incomplete clearance with fair  visualization, stool continued to clog the                            colonoscope. Cecum and right colon could not be                            cleared, prep was inadequate for screening purposes.                           A 3 mm polyp was found in the sigmoid colon. The                            polyp was sessile. The polyp was removed with a                            cold snare. Resection and retrieval were complete.                           Multiple medium-mouthed diverticula were found in                            the left colon.                           Internal hemorrhoids were found during retroflexion.                           The exam was otherwise without abnormality. Complications:            No immediate complications. Estimated blood loss:                            Minimal. Estimated Blood Loss:     Estimated blood loss was minimal. Impression:               -  Preparation of the colon was fair, stool in the                            entire examined colon.                           - One 3 mm polyp in the sigmoid colon, removed with                            a cold snare. Resected and retrieved.                           - Diverticulosis in the left colon.                           - Internal hemorrhoids.                           - The examination was otherwise normal. Recommendation:           - Patient has a  contact number available for                            emergencies. The signs and symptoms of potential                            delayed complications were discussed with the                            patient. Return to normal activities tomorrow.                            Written discharge instructions were provided to the                            patient.                           - Resume previous diet.                           - Continue present medications.                           - Await pathology results.                           - Repeat colonoscopy with 2 day prep at the                            patient's convenience in the upcoming months for                            screening purposes given prep noted on this exam Remo Lipps P. Preslei Blakley, MD 03/03/2018 8:54:56 AM This report has been signed electronically.

## 2018-03-03 NOTE — Progress Notes (Signed)
Patient did not want to make an appointment for the repeat procedure today.  I asked him to call the office when he had a good date in mind.

## 2018-03-03 NOTE — Progress Notes (Signed)
Called to room to assist during endoscopic procedure.  Patient ID and intended procedure confirmed with present staff. Received instructions for my participation in the procedure from the performing physician.  

## 2018-03-03 NOTE — Progress Notes (Signed)
To PACU, VSS. Report to [email or web address] 

## 2018-03-04 ENCOUNTER — Telehealth: Payer: Self-pay

## 2018-03-04 NOTE — Telephone Encounter (Signed)
  Follow up Call-  Call back number 03/03/2018  Post procedure Call Back phone  # [phone]  Permission to leave phone message Yes  Some recent data might be hidden     Patient questions:  Do you have a fever, pain , or abdominal swelling? No. Pain Score  0 *  Have you tolerated food without any problems? Yes.    Have you been able to return to your normal activities? Yes.    Do you have any questions about your discharge instructions: Diet   No. Medications  No. Follow up visit  No.  Do you have questions or concerns about your Care? No.  Actions: * If pain score is 4 or above: No action needed, pain <4.

## 2018-03-25 ENCOUNTER — Other Ambulatory Visit: Payer: Self-pay | Admitting: Internal Medicine

## 2018-03-25 NOTE — Telephone Encounter (Signed)
Done erx 

## 2018-04-30 ENCOUNTER — Encounter: Payer: Self-pay | Admitting: Internal Medicine

## 2018-04-30 ENCOUNTER — Ambulatory Visit (INDEPENDENT_AMBULATORY_CARE_PROVIDER_SITE_OTHER): Payer: Medicare HMO | Admitting: Internal Medicine

## 2018-04-30 DIAGNOSIS — Z0000 Encounter for general adult medical examination without abnormal findings: Secondary | ICD-10-CM

## 2018-04-30 DIAGNOSIS — R202 Paresthesia of skin: Secondary | ICD-10-CM

## 2018-04-30 DIAGNOSIS — R42 Dizziness and giddiness: Secondary | ICD-10-CM

## 2018-04-30 HISTORY — DX: Dizziness and giddiness: R42

## 2018-04-30 MED ORDER — MECLIZINE HCL 12.5 MG PO TABS: 12.5000 mg | ORAL_TABLET | Freq: Three times a day (TID) | ORAL | 1 refills | Status: AC | PRN

## 2018-04-30 NOTE — Assessment & Plan Note (Signed)
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

## 2018-04-30 NOTE — Assessment & Plan Note (Signed)
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  OK for meclizine prn,  to f/u any worsening symptoms or concerns

## 2018-04-30 NOTE — Progress Notes (Signed)
Patient ID: Jason Robles, male   DOB: Dec 16, 1945, 73 y.o.   MRN: [id number]  Virtual Visit via Video Note  I connected with [name] on 04/30/18 at  9:00 AM EDT by a video enabled telemedicine application and verified that I am speaking with the correct person using two identifiers. I am in the office, pt is at home, and no others are present  Pt is at home, I am at office, and no others present   I discussed the limitations of evaluation and management by telemedicine and the availability of in person appointments. The patient expressed understanding and agreed to proceed.  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  Pt denies chest pain, increased sob or doe, wheezing, orthopnea, PND, increased LE swelling, palpitations, or syncope.   Has hx of TIA and syncope prior, and 2014 Echo with normal EF, mild diastolic dysfxn only.   Also c/o different kind of dizziness, mostly with room spinning with lying down or waking up and looking around the room or other quick turning movements of the head; overall mild, last 5-10 seconds, only a few times per day, intermittent without HA and Pt denies new neurological symptoms such as new headache, or facial or extremity weakness or numbness, except for chronic recurring numb tingling in the right arm he thinks related to the shoulder, and more recently left hand and wrist tingling numbness at night in the last few wks, better in the am after getting up.  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    Also Does have several wks ongoing nasal allergy symptoms with clearish congestion, itch and sneezing, without fever, pain, ST, cough, swelling or wheezing.  Has chronic deafness left ear, but denies ear pain.  Allergies have been somwhat worse recently with spending more time in the yard.   Past Medical History:  Diagnosis Date  . ABDOMINAL PAIN OTHER SPECIFIED SITE 04/30/2008  . ALLERGIC RHINITIS 08/25/2007  . Anxiety 06/28/2010  . BENIGN PROSTATIC HYPERTROPHY 08/21/2006  . Chest pain 06/28/2010  . COLONIC POLYPS, HX OF 04/18/2007  . Complication of anesthesia    post op nausea and vomiting and wakes up too early per pt  . 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 Diverticulosis of colon (without mention of hemorrhage) 04/18/2007  . EXTERNAL HEMORRHOIDS 04/18/2007  . Headache(784.0) 09/20/2009   hx migraines  . HEARING LOSS, 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  . HYPERLIPIDEMIA 08/21/2006  . INSOMNIA-SLEEP DISORDER-UNSPEC 09/20/2009  . NONSPECIFIC ABN FINDING RAD & OTH EXAM GU ORGAN 05/03/2008  . PERIPHERAL VASCULAR DISEASE 08/25/2007  . PONV (postoperative nausea and vomiting)   . Raynaud's syndrome 08/21/2006   Past Surgical History:  Procedure Laterality Date  . APPENDECTOMY    . COLONOSCOPY  07/25/2006  . s/p knee surgery Bilateral    x's 4 since 1980  . s/p left shoulder  2002  rotator cuff  . s/p lumbar disc surgery  2007  . 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 TOTAL KNEE ARTHROPLASTY Right 03/26/2016   Procedure: RIGHT TOTAL KNEE ARTHROPLASTY;  Surgeon: Vickey Huger, MD;  Location: [facility];  Service: Orthopedics;  Laterality: Right;  . 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 His smoking use included cigarettes. He has a 15.00 pack-year  smoking history. He has never used smokeless tobacco. He reports current alcohol use of about 4.0 standard drinks of alcohol per week. He reports that he does not use drugs. family history includes Alzheimer's disease in his mother; Diabetes in his maternal uncle and mother; Kidney cancer in his brother. Allergies  Allergen Reactions  . Iohexol Itching    Itching post 125cc Omni injection. Treated w/ 50 mg PO benedryl.  Onset Date: [id number]    Current Outpatient Medications on File Prior to Visit  Medication Sig Dispense Refill  . aspirin 81 MG EC tablet Take 1 tablet (81 mg total) by mouth daily. Swallow whole. 30 tablet 12  . atorvastatin (LIPITOR) 10 MG tablet Take 1 tablet (10 mg total) by mouth daily. 90 tablet 3  . meloxicam (MOBIC) 15 MG tablet TAKE 1 TABLET BY MOUTH EVERY DAY 90 tablet 1  . OVER THE COUNTER MEDICATION Natural supplement daily    . zolpidem (AMBIEN) 10 MG tablet TAKE 1 TABLET BY MOUTH EVERYDAY AT BEDTIME 30 tablet 0   No current facility-administered medications on file prior to visit.     Observations/Objective: Alert, NAD, appropriate mood and affect, resps normal, cn 2-12 intact, moves all 4s, no visible rash or swelling Lab Results  Component Value Date   WBC 5.6 02/27/2017   HGB 14.4 02/27/2017   HCT 42.5 02/27/2017   PLT 180.0 02/27/2017   GLUCOSE 73 02/27/2017   CHOL 196 09/12/2016   TRIG 171.0 (H) 09/12/2016   HDL 68.30 09/12/2016   LDLDIRECT 119.0 08/13/2014   LDLCALC 93 09/12/2016   ALT 13 02/27/2017   AST 17 02/27/2017   NA 139 02/27/2017   K 4.1 02/27/2017   CL 102 02/27/2017   CREATININE 1.05 02/27/2017   BUN 17 02/27/2017   CO2 31 02/27/2017   TSH 1.35 02/27/2017   PSA 2.78 09/12/2016   INR 0.89 05/02/2012   HGBA1C 5.6 05/02/2012    Assessment and Plan: See notes  Follow Up Instructions: See notes   I discussed the assessment and treatment plan with the patient. The patient was provided an opportunity to ask questions and all  were answered. The patient agreed with the plan and demonstrated an understanding of the instructions.   The patient was advised to call back or seek an in-person evaluation if the symptoms worsen or if the condition fails to improve as anticipated.   Cathlean Cower, MD

## 2018-04-30 NOTE — Assessment & Plan Note (Signed)
With standing up from bent over position doing yard work; d/w pt benign nature of the issue, to avoid dehydration, ok to continue to work as he does with attention to standing up slowly as needed

## 2018-04-30 NOTE — Patient Instructions (Signed)
Please take all new medication as prescribed - the meclizine as needed for spinning kind of dizziness  Please drink adequate fluids to avoid dehydration  Please wear left and/or right wrist splints to less the probable Carpal Tunnel in the AM  Please continue all other medications as before, and refills have been done if requested.  Please have the pharmacy call with any other refills you may need.  Please continue your efforts at being more active, low cholesterol diet, and weight control.  Please keep your appointments with your specialists as you may have planned  We will have the office call to have you back in 3 months; Please return in 3 months, or sooner if needed, with Lab testing done 3-5 days before

## 2018-05-12 ENCOUNTER — Other Ambulatory Visit: Payer: Self-pay | Admitting: Internal Medicine

## 2018-05-12 DIAGNOSIS — L989 Disorder of the skin and subcutaneous tissue, unspecified: Secondary | ICD-10-CM

## 2018-05-19 DIAGNOSIS — L821 Other seborrheic keratosis: Secondary | ICD-10-CM | POA: Diagnosis not present

## 2018-05-19 DIAGNOSIS — D225 Melanocytic nevi of trunk: Secondary | ICD-10-CM | POA: Diagnosis not present

## 2018-06-02 ENCOUNTER — Encounter: Payer: Self-pay | Admitting: Internal Medicine

## 2018-06-02 ENCOUNTER — Ambulatory Visit (INDEPENDENT_AMBULATORY_CARE_PROVIDER_SITE_OTHER): Payer: Medicare HMO | Admitting: Internal Medicine

## 2018-06-02 DIAGNOSIS — Z79899 Other long term (current) drug therapy: Secondary | ICD-10-CM

## 2018-06-02 DIAGNOSIS — F419 Anxiety disorder, unspecified: Secondary | ICD-10-CM

## 2018-06-02 DIAGNOSIS — B351 Tinea unguium: Secondary | ICD-10-CM | POA: Diagnosis not present

## 2018-06-02 DIAGNOSIS — R69 Illness, unspecified: Secondary | ICD-10-CM | POA: Diagnosis not present

## 2018-06-02 DIAGNOSIS — E785 Hyperlipidemia, unspecified: Secondary | ICD-10-CM | POA: Diagnosis not present

## 2018-06-02 HISTORY — DX: Tinea unguium: B35.1

## 2018-06-02 MED ORDER — TERBINAFINE HCL 250 MG PO TABS: 250.0000 mg | ORAL_TABLET | Freq: Every day | ORAL | 0 refills | Status: DC

## 2018-06-02 NOTE — Assessment & Plan Note (Signed)
stable overall by history and exam, recent data reviewed with pt, and pt to continue medical treatment as before,  to f/u any worsening symptoms or concerns  

## 2018-06-02 NOTE — Progress Notes (Signed)
Patient ID: Jason Robles, male   DOB: [birth year], 73 y.o.   MRN: [id number]  Virtual Visit via Video Note  I connected with Jason Robles on 06/02/18 at  7:00 PM EDT by a video enabled telemedicine application and verified that I am speaking with the correct person using two identifiers.  Location: Patient: at home Provider: at home   I discussed the limitations of evaluation and management by telemedicine and the availability of in person appointments. The patient expressed understanding and agreed to proceed.  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 This is similar to prior and several years ago improved with lamisil.  Pt denies chest pain, increased sob or doe, wheezing, orthopnea, PND, increased LE swelling, palpitations, dizziness or syncope.  Pt denies new neurological symptoms such as new headache, or facial or extremity weakness or numbness   Pt denies polydipsia, polyuria. Trying to follow a lower cholesterol diet.  Denies worsening depressive symptoms, suicidal ideation, or panic Past Medical History:  Diagnosis Date  . ABDOMINAL PAIN OTHER SPECIFIED SITE 04/30/2008  . ALLERGIC RHINITIS 08/25/2007  . Anxiety 06/28/2010  . BENIGN PROSTATIC HYPERTROPHY 08/21/2006  . Chest pain 06/28/2010  . COLONIC POLYPS, HX OF 04/18/2007  . Complication of anesthesia    post op nausea and vomiting and wakes up too early per pt  . 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 Diverticulosis of colon (without mention of hemorrhage) 04/18/2007  . EXTERNAL HEMORRHOIDS 04/18/2007  . Headache(784.0) 09/20/2009   hx migraines  . HEARING LOSS, 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  . HYPERLIPIDEMIA 08/21/2006  . INSOMNIA-SLEEP DISORDER-UNSPEC 09/20/2009  . NONSPECIFIC ABN FINDING RAD & OTH EXAM GU ORGAN 05/03/2008  . PERIPHERAL VASCULAR DISEASE 08/25/2007  . PONV (postoperative nausea and vomiting)   . Raynaud's  syndrome 08/21/2006   Past Surgical History:  Procedure Laterality Date  . APPENDECTOMY    . COLONOSCOPY  07/25/2006  . s/p knee surgery Bilateral    x's 4 since 1980  . s/p left shoulder  2002   rotator cuff  . s/p lumbar disc surgery  2007  . 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 TOTAL KNEE ARTHROPLASTY Right 03/26/2016   Procedure: RIGHT TOTAL KNEE ARTHROPLASTY;  Surgeon: Vickey Huger, MD;  Location: [facility];  Service: Orthopedics;  Laterality: Right;  . 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 His smoking use included cigarettes. He has a 15.00 pack-year smoking history. He has never used smokeless tobacco. He reports current alcohol use of about 4.0 standard drinks of alcohol per week. He reports that he does not use drugs. family history includes Alzheimer's disease in his mother; Diabetes in his maternal uncle and mother; Kidney cancer in his brother. Allergies  Allergen Reactions  . Iohexol Itching    Itching post 125cc Omni injection. Treated w/ 50 mg PO benedryl.  Onset Date: [id number]    Current Outpatient Medications on File Prior to Visit  Medication Sig Dispense Refill  . aspirin 81 MG EC tablet Take 1 tablet (81 mg total) by mouth daily. Swallow whole. 30 tablet 12  . atorvastatin (LIPITOR) 10 MG tablet Take 1 tablet (10 mg total) by mouth daily. 90 tablet 3  . meclizine (ANTIVERT) 12.5 MG tablet Take  1 tablet (12.5 mg total) by mouth 3 (three) times daily as needed for dizziness. 30 tablet 1  . meloxicam (MOBIC) 15 MG tablet TAKE 1 TABLET BY MOUTH EVERY DAY 90 tablet 1  . OVER THE COUNTER MEDICATION Natural supplement daily    . zolpidem (AMBIEN) 10 MG tablet TAKE 1 TABLET BY MOUTH EVERYDAY AT BEDTIME 30 tablet 0   No current facility-administered medications on file prior to visit.     Observations/Objective: Alert, NAD, appropriate mood and affect, resps normal, cn 2-12 intact, moves all 4s, no visible rash or swelling Lab Results  Component Value Date   WBC 5.6 02/27/2017   HGB 14.4 02/27/2017   HCT 42.5 02/27/2017   PLT 180.0 02/27/2017   GLUCOSE 73 02/27/2017   CHOL 196 09/12/2016   TRIG 171.0 (H) 09/12/2016   HDL 68.30 09/12/2016   LDLDIRECT 119.0 08/13/2014   LDLCALC 93 09/12/2016   ALT 13 02/27/2017   AST 17 02/27/2017   NA 139 02/27/2017   K 4.1 02/27/2017   CL 102 02/27/2017   CREATININE 1.05 02/27/2017   BUN 17 02/27/2017   CO2 31 02/27/2017   TSH 1.35 02/27/2017   PSA 2.78 09/12/2016   INR 0.89 05/02/2012   HGBA1C 5.6 05/02/2012   Assessment and Plan: See notes  Follow Up Instructions: See notes   I discussed the assessment and treatment plan with the patient. The patient was provided an opportunity to ask questions and all were answered. The patient agreed with the plan and demonstrated an understanding of the instructions.   The patient was advised to call back or seek an in-person evaluation if the symptoms worsen or if the condition fails to improve as anticipated.  Cathlean Cower, MD

## 2018-06-02 NOTE — Patient Instructions (Signed)
Please take all new medication as prescribed - the lamisil  Please return to the lab in 6 weeks for liver testing  Please continue all other medications as before, and refills have been done if requested.  Please have the pharmacy call with any other refills you may need.  Please continue your efforts at being more active, low cholesterol diet, and weight control.  Please keep your appointments with your specialists as you may have planned

## 2018-06-02 NOTE — Assessment & Plan Note (Signed)
Ok for lamisil repeat x 12 wks with lfts at 6 wks

## 2018-06-06 ENCOUNTER — Other Ambulatory Visit: Payer: Self-pay | Admitting: Internal Medicine

## 2018-06-06 NOTE — Telephone Encounter (Signed)
Done erx 

## 2018-06-19 ENCOUNTER — Encounter: Payer: Self-pay | Admitting: Gastroenterology

## 2018-07-09 DIAGNOSIS — Z7982 Long term (current) use of aspirin: Secondary | ICD-10-CM | POA: Diagnosis not present

## 2018-07-09 DIAGNOSIS — G4700 Insomnia, unspecified: Secondary | ICD-10-CM | POA: Diagnosis not present

## 2018-07-09 DIAGNOSIS — Z833 Family history of diabetes mellitus: Secondary | ICD-10-CM | POA: Diagnosis not present

## 2018-07-09 DIAGNOSIS — G8929 Other chronic pain: Secondary | ICD-10-CM | POA: Diagnosis not present

## 2018-07-09 DIAGNOSIS — Z791 Long term (current) use of non-steroidal anti-inflammatories (NSAID): Secondary | ICD-10-CM | POA: Diagnosis not present

## 2018-07-09 DIAGNOSIS — E785 Hyperlipidemia, unspecified: Secondary | ICD-10-CM | POA: Diagnosis not present

## 2018-07-09 DIAGNOSIS — H547 Unspecified visual loss: Secondary | ICD-10-CM | POA: Diagnosis not present

## 2018-07-09 DIAGNOSIS — Z809 Family history of malignant neoplasm, unspecified: Secondary | ICD-10-CM | POA: Diagnosis not present

## 2018-07-09 DIAGNOSIS — B49 Unspecified mycosis: Secondary | ICD-10-CM | POA: Diagnosis not present

## 2018-07-09 DIAGNOSIS — M1990 Unspecified osteoarthritis, unspecified site: Secondary | ICD-10-CM | POA: Diagnosis not present

## 2018-07-14 ENCOUNTER — Other Ambulatory Visit (INDEPENDENT_AMBULATORY_CARE_PROVIDER_SITE_OTHER): Payer: Medicare HMO

## 2018-07-14 DIAGNOSIS — Z0000 Encounter for general adult medical examination without abnormal findings: Secondary | ICD-10-CM | POA: Diagnosis not present

## 2018-07-14 DIAGNOSIS — Z125 Encounter for screening for malignant neoplasm of prostate: Secondary | ICD-10-CM

## 2018-07-14 LAB — CBC WITH DIFFERENTIAL/PLATELET
Basophils Absolute: 0 10*3/uL (ref 0.0–0.1)
Basophils Relative: 0.6 % (ref 0.0–3.0)
Eosinophils Absolute: 0.1 10*3/uL (ref 0.0–0.7)
Eosinophils Relative: 2 % (ref 0.0–5.0)
HCT: 43 % (ref 39.0–52.0)
Hemoglobin: 14.5 g/dL (ref 13.0–17.0)
Lymphocytes Relative: 30.3 % (ref 12.0–46.0)
Lymphs Abs: 2 10*3/uL (ref 0.7–4.0)
MCHC: 33.7 g/dL (ref 30.0–36.0)
MCV: 88 fl (ref 78.0–100.0)
Monocytes Absolute: 0.4 10*3/uL (ref 0.1–1.0)
Monocytes Relative: 6.6 % (ref 3.0–12.0)
Neutro Abs: 4 10*3/uL (ref 1.4–7.7)
Neutrophils Relative %: 60.5 % (ref 43.0–77.0)
Platelets: 177 10*3/uL (ref 150.0–400.0)
RBC: 4.88 Mil/uL (ref 4.22–5.81)
RDW: 13.9 % (ref 11.5–15.5)
WBC: 6.7 10*3/uL (ref 4.0–10.5)

## 2018-07-14 LAB — BASIC METABOLIC PANEL
BUN: 24 mg/dL — ABNORMAL HIGH (ref 6–23)
CO2: 31 mEq/L (ref 19–32)
Calcium: 9.6 mg/dL (ref 8.4–10.5)
Chloride: 103 mEq/L (ref 96–112)
Creatinine, Ser: 1.22 mg/dL (ref 0.40–1.50)
GFR: 58.53 mL/min — ABNORMAL LOW (ref >60.00)
Glucose, Bld: 108 mg/dL — ABNORMAL HIGH (ref 70–99)
Potassium: 4.2 mEq/L (ref 3.5–5.1)
Sodium: 141 mEq/L (ref 135–145)

## 2018-07-14 LAB — PSA: PSA: 2.92 ng/mL (ref 0.10–4.00)

## 2018-07-14 LAB — URINALYSIS, ROUTINE W REFLEX MICROSCOPIC
Hgb urine dipstick: NEGATIVE
Ketones, ur: NEGATIVE
Leukocytes,Ua: NEGATIVE
Nitrite: NEGATIVE
RBC / HPF: NONE SEEN (ref 0–?)
Specific Gravity, Urine: >=1.03 — AB (ref 1.000–1.030)
Total Protein, Urine: NEGATIVE
Urine Glucose: NEGATIVE
Urobilinogen, UA: 0.2 (ref 0.0–1.0)
pH: 5 (ref 5.0–8.0)

## 2018-07-14 LAB — LIPID PANEL
Cholesterol: 181 mg/dL (ref 0–200)
HDL: 55.2 mg/dL (ref >39.00)
Total CHOL/HDL Ratio: 3
Triglycerides: 456 mg/dL — ABNORMAL HIGH (ref 0.0–149.0)

## 2018-07-14 LAB — HEPATIC FUNCTION PANEL
ALT: 12 U/L (ref 0–53)
AST: 14 U/L (ref 0–37)
Albumin: 4.4 g/dL (ref 3.5–5.2)
Alkaline Phosphatase: 67 U/L (ref 39–117)
Bilirubin, Direct: 0.1 mg/dL (ref 0.0–0.3)
Total Bilirubin: 0.4 mg/dL (ref 0.2–1.2)
Total Protein: 6.7 g/dL (ref 6.0–8.3)

## 2018-07-14 LAB — TSH: TSH: 1.63 u[IU]/mL (ref 0.35–4.50)

## 2018-07-14 LAB — LDL CHOLESTEROL, DIRECT: Direct LDL: 100 mg/dL

## 2018-07-31 ENCOUNTER — Ambulatory Visit (INDEPENDENT_AMBULATORY_CARE_PROVIDER_SITE_OTHER): Payer: Medicare HMO | Admitting: Internal Medicine

## 2018-07-31 ENCOUNTER — Other Ambulatory Visit: Payer: Self-pay

## 2018-07-31 ENCOUNTER — Encounter: Payer: Self-pay | Admitting: Internal Medicine

## 2018-07-31 VITALS — BP 116/76 | HR 72 | Temp 98.3°F | Ht 71.0 in | Wt 180.0 lb

## 2018-07-31 DIAGNOSIS — N289 Disorder of kidney and ureter, unspecified: Secondary | ICD-10-CM

## 2018-07-31 DIAGNOSIS — E538 Deficiency of other specified B group vitamins: Secondary | ICD-10-CM | POA: Diagnosis not present

## 2018-07-31 DIAGNOSIS — E611 Iron deficiency: Secondary | ICD-10-CM

## 2018-07-31 DIAGNOSIS — Z0000 Encounter for general adult medical examination without abnormal findings: Secondary | ICD-10-CM | POA: Diagnosis not present

## 2018-07-31 DIAGNOSIS — Z23 Encounter for immunization: Secondary | ICD-10-CM

## 2018-07-31 DIAGNOSIS — R739 Hyperglycemia, unspecified: Secondary | ICD-10-CM

## 2018-07-31 DIAGNOSIS — E559 Vitamin D deficiency, unspecified: Secondary | ICD-10-CM

## 2018-07-31 DIAGNOSIS — E785 Hyperlipidemia, unspecified: Secondary | ICD-10-CM

## 2018-07-31 HISTORY — DX: Disorder of kidney and ureter, unspecified: N28.9

## 2018-07-31 NOTE — Progress Notes (Signed)
Subjective:    Patient ID: Jason Robles, male    DOB: 31-Oct-1945, 73 y.o.   MRN: [id number]  HPI  Here for wellness and f/u;  Overall doing ok;  Pt denies Chest pain, worsening SOB, DOE, wheezing, orthopnea, PND, worsening LE edema, palpitations, dizziness or syncope.  Pt denies neurological change such as new headache, facial or extremity weakness.  Pt denies polydipsia, polyuria, or low sugar symptoms. Pt states overall good compliance with treatment and medications, good tolerability, and has been trying to follow appropriate diet.  Pt denies worsening depressive symptoms, suicidal ideation or panic. No fever, night sweats, wt loss, loss of appetite, or other constitutional symptoms.  Pt states good ability with ADL's, has low fall risk, home safety reviewed and adequate, no other significant changes in hearing or vision, and only occasionally active with exercise. Plans to call for the repeat colonoscopy soon.  Admits to rich food for lunch most days Past Medical History:  Diagnosis Date  . ABDOMINAL PAIN OTHER SPECIFIED SITE 04/30/2008  . ALLERGIC RHINITIS 08/25/2007  . Anxiety 06/28/2010  . BENIGN PROSTATIC HYPERTROPHY 08/21/2006  . Chest pain 06/28/2010  . COLONIC POLYPS, HX OF 04/18/2007  . Complication of anesthesia    post op nausea and vomiting and wakes up too early per pt  . 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 Diverticulosis of colon (without mention of hemorrhage) 04/18/2007  . EXTERNAL HEMORRHOIDS 04/18/2007  . Headache(784.0) 09/20/2009   hx migraines  . HEARING LOSS, 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  . HYPERLIPIDEMIA 08/21/2006  . INSOMNIA-SLEEP DISORDER-UNSPEC 09/20/2009  . NONSPECIFIC ABN FINDING RAD & OTH EXAM GU ORGAN 05/03/2008  . PERIPHERAL VASCULAR DISEASE 08/25/2007  . PONV (postoperative nausea and vomiting)   . Raynaud's syndrome 08/21/2006   Past Surgical History:  Procedure Laterality Date  . APPENDECTOMY    . COLONOSCOPY  07/25/2006  . s/p knee surgery  Bilateral    x's 4 since 1980  . s/p left shoulder  2002   rotator cuff  . s/p lumbar disc surgery  2007  . 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 TOTAL KNEE ARTHROPLASTY Right 03/26/2016   Procedure: RIGHT TOTAL KNEE ARTHROPLASTY;  Surgeon: Vickey Huger, MD;  Location: [facility];  Service: Orthopedics;  Laterality: Right;  . 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 His smoking use included cigarettes. He has a 15.00 pack-year smoking history. He has never used smokeless tobacco. He reports current alcohol use of about 4.0 standard drinks of alcohol per week. He reports that he does not use drugs. family history includes Alzheimer's disease in his mother; Diabetes in his maternal uncle and mother; Kidney cancer in his brother. Allergies  Allergen Reactions  . Iohexol Itching    Itching post 125cc Omni injection. Treated w/ 50 mg PO benedryl.  Onset Date: [id number]    Current Outpatient Medications on File Prior to Visit  Medication Sig Dispense Refill  . aspirin 81 MG EC tablet Take 1 tablet (81 mg total) by mouth daily. Swallow whole. 30 tablet 12  . atorvastatin (LIPITOR) 10 MG tablet Take 1 tablet (10 mg total) by mouth daily. 90 tablet 3  . meclizine (ANTIVERT) 12.5 MG tablet Take 1 tablet (12.5 mg total) by mouth 3 (three) times daily as needed for dizziness. 30 tablet 1  .  meloxicam (MOBIC) 15 MG tablet TAKE 1 TABLET BY MOUTH EVERY DAY 90 tablet 1  . OVER THE COUNTER MEDICATION Natural supplement daily    . terbinafine (LAMISIL) 250 MG tablet Take 1 tablet (250 mg total) by mouth daily. 90 tablet 0  . zolpidem (AMBIEN) 10 MG tablet TAKE 1 TABLET BY MOUTH EVERYDAY AT BEDTIME 90 tablet 1   No current facility-administered medications on file prior to visit.    Review of Systems Constitutional: Negative for other  unusual diaphoresis, sweats, appetite or weight changes HENT: Negative for other worsening hearing loss, ear pain, facial swelling, mouth sores or neck stiffness.   Eyes: Negative for other worsening pain, redness or other visual disturbance.  Respiratory: Negative for other stridor or swelling Cardiovascular: Negative for other palpitations or other chest pain  Gastrointestinal: Negative for worsening diarrhea or loose stools, blood in stool, distention or other pain Genitourinary: Negative for hematuria, flank pain or other change in urine volume.  Musculoskeletal: Negative for myalgias or other joint swelling.  Skin: Negative for other color change, or other wound or worsening drainage.  Neurological: Negative for other syncope or numbness. Hematological: Negative for other adenopathy or swelling Psychiatric/Behavioral: Negative for hallucinations, other worsening agitation, SI, self-injury, or new decreased concentration ALl other system neg per pt    Objective:   Physical Exam BP 116/76   Pulse 72   Temp 98.3 F (36.8 C) (Oral)   Ht 5\' 11"  (1.803 m)   Wt 180 lb (81.6 kg)   SpO2 96%   BMI 25.10 kg/m  VS noted,  Constitutional: Pt is oriented to person, place, and time. Appears well-developed and well-nourished, in no significant distress and comfortable Head: Normocephalic and atraumatic  Eyes: Conjunctivae and EOM are normal. Pupils are equal, round, and reactive to light Right Ear: External ear normal without discharge Left Ear: External ear normal without discharge Nose: Nose without discharge or deformity Mouth/Throat: Oropharynx is without other ulcerations and moist  Neck: Normal range of motion. Neck supple. No JVD present. No tracheal deviation present or significant neck LA or mass Cardiovascular: Normal rate, regular rhythm, normal heart sounds and intact distal pulses.   Pulmonary/Chest: WOB normal and breath sounds without rales or wheezing  Abdominal: Soft. Bowel  sounds are normal. NT. No HSM  Musculoskeletal: Normal range of motion. Exhibits no edema Lymphadenopathy: Has no other cervical adenopathy.  Neurological: Pt is alert and oriented to person, place, and time. Pt has normal reflexes. No cranial nerve deficit. Motor grossly intact, Gait intact Skin: Skin is warm and dry. No rash noted or new ulcerations Psychiatric:  Has normal mood and affect. Behavior is normal without agitation No other exam findings Lab Results  Component Value Date   WBC 6.7 07/14/2018   HGB 14.5 07/14/2018   HCT 43.0 07/14/2018   PLT 177.0 07/14/2018   GLUCOSE 108 (H) 07/14/2018   CHOL 181 07/14/2018   TRIG (H) 07/14/2018    456.0 Triglyceride is over 400; calculations on Lipids are invalid.   HDL 55.20 07/14/2018   LDLDIRECT 100.0 07/14/2018   LDLCALC 93 09/12/2016   ALT 12 07/14/2018   AST 14 07/14/2018   NA 141 07/14/2018   K 4.2 07/14/2018   CL 103 07/14/2018   CREATININE 1.22 07/14/2018   BUN 24 (H) 07/14/2018   CO2 31 07/14/2018   TSH 1.63 07/14/2018   PSA 2.92 07/14/2018   INR 0.89 05/02/2012   HGBA1C 5.6 05/02/2012       Assessment &  Plan:

## 2018-07-31 NOTE — Assessment & Plan Note (Signed)
With elev TG, declines vascepa, for low fat diet

## 2018-07-31 NOTE — Assessment & Plan Note (Signed)
New mild worening, for fu bmp in 3 mo to monitor

## 2018-07-31 NOTE — Assessment & Plan Note (Signed)

## 2018-07-31 NOTE — Patient Instructions (Addendum)
You had the Prevnar 13 pneumonia shot today  Please return in 3 months for the repeat kidney testing  Please continue all other medications as before, and refills have been done if requested.  Please have the pharmacy call with any other refills you may need.  Please continue your efforts at being more active, low cholesterol diet, and weight control.  You are otherwise up to date with prevention measures today.  Please keep your appointments with your specialists as you may have planned  Please return in 1 year for your yearly visit, or sooner if needed, with Lab testing done 3-5 days before

## 2018-08-28 ENCOUNTER — Other Ambulatory Visit: Payer: Self-pay | Admitting: Internal Medicine

## 2018-11-01 DIAGNOSIS — R69 Illness, unspecified: Secondary | ICD-10-CM | POA: Diagnosis not present

## 2018-12-13 ENCOUNTER — Other Ambulatory Visit: Payer: Self-pay | Admitting: Internal Medicine

## 2018-12-15 NOTE — Telephone Encounter (Signed)
Done erx 

## 2018-12-16 ENCOUNTER — Other Ambulatory Visit: Payer: Self-pay | Admitting: *Deleted

## 2018-12-16 MED ORDER — ATORVASTATIN CALCIUM 10 MG PO TABS: 10.0000 mg | ORAL_TABLET | Freq: Every day | ORAL | 3 refills | Status: DC

## 2018-12-31 ENCOUNTER — Ambulatory Visit: Payer: Medicare HMO | Attending: Internal Medicine

## 2018-12-31 DIAGNOSIS — Z20828 Contact with and (suspected) exposure to other viral communicable diseases: Secondary | ICD-10-CM | POA: Diagnosis not present

## 2018-12-31 DIAGNOSIS — Z20822 Contact with and (suspected) exposure to covid-19: Secondary | ICD-10-CM

## 2019-01-01 LAB — NOVEL CORONAVIRUS, NAA: SARS-CoV-2, NAA: NOT DETECTED

## 2019-01-05 DIAGNOSIS — D225 Melanocytic nevi of trunk: Secondary | ICD-10-CM | POA: Diagnosis not present

## 2019-01-05 DIAGNOSIS — D485 Neoplasm of uncertain behavior of skin: Secondary | ICD-10-CM | POA: Diagnosis not present

## 2019-02-22 ENCOUNTER — Ambulatory Visit: Payer: Medicare HMO | Attending: Internal Medicine

## 2019-02-22 DIAGNOSIS — Z23 Encounter for immunization: Secondary | ICD-10-CM | POA: Insufficient documentation

## 2019-02-22 NOTE — Progress Notes (Signed)
   Covid-19 Vaccination Clinic  Name:  Jason Robles    MRN: [id number] DOB: [birth year]  02/22/2019  Jason Robles was observed post Covid-19 immunization for 15 minutes without incidence. He was provided with Vaccine Information Sheet and instruction to access the V-Safe system.   Jason Robles was instructed to call 911 with any severe reactions post vaccine: [name] Difficulty breathing  . Swelling of your face and throat  . A fast heartbeat  . A bad rash all over your body  . Dizziness and weakness    Immunizations Administered    Name Date Dose VIS Date Route   Pfizer COVID-19 Vaccine 02/22/2019 11:03 AM 0.3 mL 12/12/2018 Intramuscular   Manufacturer: [place]   Lot: [id number]   [place]: [id number]

## 2019-03-12 ENCOUNTER — Telehealth: Payer: Self-pay

## 2019-03-12 MED ORDER — MELOXICAM 15 MG PO TABS: 15.0000 mg | ORAL_TABLET | Freq: Every day | ORAL | 1 refills | Status: DC

## 2019-03-12 NOTE — Telephone Encounter (Signed)
1.Medication Requested: meloxicam (MOBIC) 15 MG tablet  2. Pharmacy (Name, [place], [place]):[id number] - [place], [address]  3. On Med List:  Yes   4. Last Visit with PCP: 7.30.20  5. Next visit date with PCP: 8.4.21  Agent: Please be advised that RX refills may take up to 3 business days. We ask that you follow-up with your pharmacy.

## 2019-03-12 NOTE — Telephone Encounter (Signed)
Done erx 

## 2019-03-12 NOTE — Telephone Encounter (Signed)
Med has not been refilled since 2019. Pls advise if ok to refill.Marland KitchenJohny Robles

## 2019-03-18 ENCOUNTER — Ambulatory Visit: Payer: Medicare HMO | Attending: Internal Medicine

## 2019-03-18 DIAGNOSIS — Z23 Encounter for immunization: Secondary | ICD-10-CM

## 2019-03-18 NOTE — Progress Notes (Signed)
   Covid-19 Vaccination Clinic  Name:  Jason Robles    MRN: [id number] DOB: January 08, 1945  03/18/2019  [name] was observed post Covid-19 immunization for 15 minutes without incident. He was provided with Vaccine Information Sheet and instruction to access the V-Safe system.   [name] was instructed to call 911 with any severe reactions post vaccine: [name] Difficulty breathing  . Swelling of face and throat  . A fast heartbeat  . A bad rash all over body  . Dizziness and weakness   Immunizations Administered    Name Date Dose VIS Date Route   Pfizer COVID-19 Vaccine 03/18/2019  8:41 AM 0.3 mL 12/12/2018 Intramuscular   Manufacturer: [place]   Lot: [id number]   [place]: [id number]

## 2019-04-15 ENCOUNTER — Other Ambulatory Visit: Payer: Self-pay

## 2019-04-15 ENCOUNTER — Ambulatory Visit (INDEPENDENT_AMBULATORY_CARE_PROVIDER_SITE_OTHER): Payer: Medicare HMO | Admitting: Internal Medicine

## 2019-04-15 ENCOUNTER — Encounter: Payer: Self-pay | Admitting: Internal Medicine

## 2019-04-15 ENCOUNTER — Ambulatory Visit (INDEPENDENT_AMBULATORY_CARE_PROVIDER_SITE_OTHER): Payer: Medicare HMO

## 2019-04-15 VITALS — BP 130/72 | HR 64 | Temp 98.4°F | Ht 71.0 in | Wt 178.6 lb

## 2019-04-15 DIAGNOSIS — M159 Polyosteoarthritis, unspecified: Secondary | ICD-10-CM | POA: Insufficient documentation

## 2019-04-15 DIAGNOSIS — S67194A Crushing injury of right ring finger, initial encounter: Secondary | ICD-10-CM

## 2019-04-15 DIAGNOSIS — M8949 Other hypertrophic osteoarthropathy, multiple sites: Secondary | ICD-10-CM

## 2019-04-15 DIAGNOSIS — S6991XA Unspecified injury of right wrist, hand and finger(s), initial encounter: Secondary | ICD-10-CM | POA: Diagnosis not present

## 2019-04-15 DIAGNOSIS — M150 Primary generalized (osteo)arthritis: Secondary | ICD-10-CM

## 2019-04-15 HISTORY — DX: Primary generalized (osteo)arthritis: M15.0

## 2019-04-15 HISTORY — DX: Polyosteoarthritis, unspecified: M15.9

## 2019-04-15 HISTORY — DX: Crushing injury of right ring finger, initial encounter: S67.194A

## 2019-04-15 MED ORDER — TRAMADOL HCL 50 MG PO TABS: 50.0000 mg | ORAL_TABLET | Freq: Two times a day (BID) | ORAL | 0 refills | Status: DC | PRN

## 2019-04-15 NOTE — Progress Notes (Signed)
Subjective:  Patient ID: Jason Robles, male    DOB: 05-02-1945  Age: 74 y.o. MRN: [id number]  CC: Osteoarthritis and Finger Injury  This visit occurred during the SARS-CoV-2 public health emergency.  Safety protocols were in place, including screening questions prior to the visit, additional usage of staff PPE, and extensive cleaning of exam room while observing appropriate contact time as indicated for disinfecting solutions.   NEW TO ME  HPI Jason Robles presents for f/up - He complains that 2 days ago he hit his right ring finger all on a wooden board and now has pain and swelling.  He has good range of motion and denies numbness, weakness, or tingling.  He also complains of worsening pain in his joints.  He says the discomfort is located in the hands, shoulders, and knees.  He is not getting much symptom relief with Tylenol and meloxicam.  He tells me the pain interferes with his sleep and his daily activities.  Outpatient Medications Prior to Visit  Medication Sig Dispense Refill  . aspirin 81 MG EC tablet Take 1 tablet (81 mg total) by mouth daily. Swallow whole. 30 tablet 12  . atorvastatin (LIPITOR) 10 MG tablet Take 1 tablet (10 mg total) by mouth daily. 90 tablet 3  . meclizine (ANTIVERT) 12.5 MG tablet Take 1 tablet (12.5 mg total) by mouth 3 (three) times daily as needed for dizziness. 30 tablet 1  . meloxicam (MOBIC) 15 MG tablet Take 1 tablet (15 mg total) by mouth daily. 90 tablet 1  . terbinafine (LAMISIL) 250 MG tablet TAKE 1 TABLET BY MOUTH EVERY DAY 90 tablet 0  . zolpidem (AMBIEN) 10 MG tablet TAKE 1 TABLET BY MOUTH EVERYDAY AT BEDTIME 30 tablet 5  . OVER THE COUNTER MEDICATION Natural supplement daily     No facility-administered medications prior to visit.    ROS Review of Systems  Constitutional: Negative.  Negative for chills, fatigue and fever.  HENT: Negative.   Eyes: Negative.   Respiratory: Negative for cough, chest tightness, shortness of  breath and wheezing.   Cardiovascular: Negative for chest pain, palpitations and leg swelling.  Gastrointestinal: Negative for abdominal pain.  Endocrine: Negative.   Genitourinary: Negative.  Negative for difficulty urinating.  Musculoskeletal: Positive for arthralgias. Negative for myalgias.       There are degenerative changes noted in the knees, MCP, and PIP joints.  None of the joints demonstrate synovitis, tenderness, swelling, or erythema.  Skin: Negative.  Negative for rash.  Neurological: Negative.  Negative for dizziness and weakness.  Hematological: Negative for adenopathy. Does not bruise/bleed easily.  Psychiatric/Behavioral: Negative.     Objective:  BP 130/72 (BP Location: Left Arm, Patient Position: Sitting, Cuff Size: Large)   Pulse 64   Temp 98.4 F (36.9 C) (Oral)   Ht 5\' 11"  (1.803 m)   Wt 178 lb 9.6 oz (81 kg)   SpO2 97%   BMI 24.91 kg/m   BP Readings from Last 3 Encounters:  04/15/19 130/72  07/31/18 116/76  03/03/18 112/68    Wt Readings from Last 3 Encounters:  04/15/19 178 lb 9.6 oz (81 kg)  07/31/18 180 lb (81.6 kg)  03/03/18 183 lb (83 kg)    Physical Exam Vitals reviewed.  Constitutional:      Appearance: Normal appearance.  HENT:     Nose: Nose normal.     Mouth/Throat:     Mouth: Mucous membranes are moist.  Eyes:     General: No  scleral icterus.    Conjunctiva/sclera: Conjunctivae normal.  Cardiovascular:     Rate and Rhythm: Normal rate and regular rhythm.     Heart sounds: No murmur.  Pulmonary:     Effort: Pulmonary effort is normal.     Breath sounds: No stridor. No wheezing, rhonchi or rales.  Abdominal:     General: Abdomen is flat.     Palpations: There is no mass.     Tenderness: There is no abdominal tenderness.  Musculoskeletal:        General: Signs of injury present. Normal range of motion.     Cervical back: Neck supple.     Right lower leg: No edema.     Left lower leg: No edema.     Comments: Right ring finger  shows mild swelling, tenderness, and ecchymosis over the dorsum of the middle and distal phalanx.  There is good sensation and capillary refill.  There are no foreign bodies or wounds.  He has good flexion and extension at the IP joints.  The fingernail is intact.  Lymphadenopathy:     Cervical: No cervical adenopathy.  Neurological:     Mental Status: He is alert.     Lab Results  Component Value Date   WBC 6.7 07/14/2018   HGB 14.5 07/14/2018   HCT 43.0 07/14/2018   PLT 177.0 07/14/2018   GLUCOSE 108 (H) 07/14/2018   CHOL 181 07/14/2018   TRIG (H) 07/14/2018    456.0 Triglyceride is over 400; calculations on Lipids are invalid.   HDL 55.20 07/14/2018   LDLDIRECT 100.0 07/14/2018   LDLCALC 93 09/12/2016   ALT 12 07/14/2018   AST 14 07/14/2018   NA 141 07/14/2018   K 4.2 07/14/2018   CL 103 07/14/2018   CREATININE 1.22 07/14/2018   BUN 24 (H) 07/14/2018   CO2 31 07/14/2018   TSH 1.63 07/14/2018   PSA 2.92 07/14/2018   INR 0.89 05/02/2012   HGBA1C 5.6 05/02/2012    VAS US CAROTID  Result Date: 03/05/2017 Carotid Arterial Duplex Study Indications:       Carotid stenosis and episode of confusion while driving                    02/27/17. Risk Factors:      Hyperlipidemia, past history of smoking, prior CVA. Comparison Study:  In 7/12, carotid artery duplex showed a peak systolic                    velocity of 104 cm/sec in the right ICA and a peak systolic                    velocity of 102 cm/sec in the left ICA. Examination Guidelines: A complete evaluation includes B-mode imaging, spectral doppler, color doppler, and power doppler as needed of all accessible portions of each vessel. Bilateral testing is considered an integral part of a complete examination. Limited examinations for reoccurring indications may be performed as noted.  Right Carotid Findings: +----------+--------+--------+--------+------------+--------+           PSV cm/sEDV cm/sStenosisDescribe    Comments  +----------+--------+--------+--------+------------+--------+ CCA Prox  112     15                                   +----------+--------+--------+--------+------------+--------+ CCA Distal89      19                                   +----------+--------+--------+--------+------------+--------+  ICA Prox  71      20                                   +----------+--------+--------+--------+------------+--------+ ICA Mid   103     27      1-39%                        +----------+--------+--------+--------+------------+--------+ ICA Distal70      19                                   +----------+--------+--------+--------+------------+--------+ ECA       80      10              heterogenous         +----------+--------+--------+--------+------------+--------+ +----------+--------+-------+----------------+-------------------+           PSV cm/sEDV cmsDescribe        Arm Pressure (mmHG) +----------+--------+-------+----------------+-------------------+ Subclavian135     0      Multiphasic, [id number]                 +----------+--------+-------+----------------+-------------------+ +---------+--------+--+--------+--+---------+ VertebralPSV cm/s68EDV cm/s13Antegrade +---------+--------+--+--------+--+---------+  Left Carotid Findings: +----------+--------+--------+--------+------------+--------+           PSV cm/sEDV cm/sStenosisDescribe    Comments +----------+--------+--------+--------+------------+--------+ CCA Prox  116     18                                   +----------+--------+--------+--------+------------+--------+ CCA Distal81      15                                   +----------+--------+--------+--------+------------+--------+ ICA Prox  73      21                                   +----------+--------+--------+--------+------------+--------+ ICA Mid   122     36      1-39%                         +----------+--------+--------+--------+------------+--------+ ICA Distal67      14                          tortuous +----------+--------+--------+--------+------------+--------+ ECA       92      13              heterogenous         +----------+--------+--------+--------+------------+--------+ +----------+--------+--------+----------------+-------------------+ SubclavianPSV cm/sEDV cm/sDescribe        Arm Pressure (mmHG) +----------+--------+--------+----------------+-------------------+           87      0       Multiphasic, [id number]                 +----------+--------+--------+----------------+-------------------+ +---------+--------+--+--------+--+---------+ VertebralPSV cm/s58EDV cm/s12Antegrade +---------+--------+--+--------+--+---------+  Final Interpretation: Right Carotid: Velocities in the right ICA are consistent with a 1-39% stenosis. Left Carotid: Velocities in the left ICA are consistent with a 1-39% stenosis. Vertebrals:  Both vertebral arteries were patent with antegrade flow. Subclavians: Normal flow hemodynamics were seen  in bilateral subclavian              arteries. *See table(s) above for measurements and observations.  Electronically signed by Ena Dawley on 03/05/2017 at 11:25:59 AM.    DG Finger Ring Right  Result Date: 04/16/2019 CLINICAL DATA:  Trauma EXAM: RIGHT RING FINGER 2+V COMPARISON:  None. FINDINGS: No fracture or malalignment. No radiopaque foreign body. Joint spaces are maintained IMPRESSION: No acute osseous abnormality Electronically Signed   By: Donavan Foil M.D.   On: 04/16/2019 02:04    Assessment & Plan:   Castor was seen today for osteoarthritis and finger injury.  Diagnoses and all orders for this visit:  Crushing injury of right ring finger, initial encounter-based on his symptoms, exam, and x-ray this is a contusion.  He will rest, ice, elevate and take his usual meds for pain. -     DG Finger Ring Right;  Future  Primary osteoarthritis involving multiple joints- I have asked him to add tramadol for the pain that interferes with his sleep and daily activities. -     traMADol (ULTRAM) 50 MG tablet; Take 1 tablet (50 mg total) by mouth every 12 (twelve) hours as needed.   I have discontinued Jason Robles's OVER THE COUNTER MEDICATION. I am also having him start on traMADol. Additionally, I am having him maintain his aspirin, meclizine, terbinafine, zolpidem, atorvastatin, and meloxicam.  Meds ordered this encounter  Medications  . traMADol (ULTRAM) 50 MG tablet    Sig: Take 1 tablet (50 mg total) by mouth every 12 (twelve) hours as needed.    Dispense:  180 tablet    Refill:  0     Follow-up: Return if symptoms worsen or fail to improve.  Scarlette Calico, MD

## 2019-04-15 NOTE — Patient Instructions (Signed)

## 2019-04-16 ENCOUNTER — Encounter: Payer: Self-pay | Admitting: Internal Medicine

## 2019-04-20 ENCOUNTER — Telehealth: Payer: Self-pay | Admitting: Internal Medicine

## 2019-04-20 NOTE — Telephone Encounter (Signed)
    Patient calling to request prior auth for traMADol (ULTRAM) 50 MG tablet prescribed by Dr Ronnald Ramp on 04/14. Patient states pharmacy unable to fill

## 2019-06-05 DIAGNOSIS — M19011 Primary osteoarthritis, right shoulder: Secondary | ICD-10-CM | POA: Diagnosis not present

## 2019-06-05 DIAGNOSIS — M25511 Pain in right shoulder: Secondary | ICD-10-CM | POA: Diagnosis not present

## 2019-06-05 DIAGNOSIS — M25512 Pain in left shoulder: Secondary | ICD-10-CM | POA: Diagnosis not present

## 2019-06-05 DIAGNOSIS — M19012 Primary osteoarthritis, left shoulder: Secondary | ICD-10-CM | POA: Diagnosis not present

## 2019-06-08 DIAGNOSIS — M19019 Primary osteoarthritis, unspecified shoulder: Secondary | ICD-10-CM

## 2019-06-08 HISTORY — DX: Primary osteoarthritis, unspecified shoulder: M19.019

## 2019-07-02 ENCOUNTER — Other Ambulatory Visit: Payer: Self-pay | Admitting: Internal Medicine

## 2019-07-02 NOTE — Telephone Encounter (Signed)
Both done erx for 1 mo only per office refill policy  Please to contact pt - due for ROV for further refills

## 2019-07-23 ENCOUNTER — Telehealth (INDEPENDENT_AMBULATORY_CARE_PROVIDER_SITE_OTHER): Payer: Medicare HMO | Admitting: Internal Medicine

## 2019-07-23 DIAGNOSIS — J309 Allergic rhinitis, unspecified: Secondary | ICD-10-CM | POA: Diagnosis not present

## 2019-07-23 DIAGNOSIS — G5603 Carpal tunnel syndrome, bilateral upper limbs: Secondary | ICD-10-CM

## 2019-07-23 DIAGNOSIS — M19049 Primary osteoarthritis, unspecified hand: Secondary | ICD-10-CM | POA: Diagnosis not present

## 2019-07-23 MED ORDER — TRIAMCINOLONE ACETONIDE 55 MCG/ACT NA AERO: 2.0000 | INHALATION_SPRAY | Freq: Every day | NASAL | 12 refills | Status: DC

## 2019-07-23 MED ORDER — PREDNISONE 10 MG PO TABS: ORAL_TABLET | ORAL | 0 refills | Status: DC

## 2019-07-23 MED ORDER — CETIRIZINE HCL 10 MG PO TABS: 10.0000 mg | ORAL_TABLET | Freq: Every day | ORAL | 11 refills | Status: DC

## 2019-07-23 NOTE — Progress Notes (Signed)
Patient ID: Jason Robles, male   DOB: 1945/12/11, 74 y.o.   MRN: [id number]  Virtual Visit via Video Note  I connected with Jason Robles on 07/23/19 at  8:40 AM EDT by a video enabled telemedicine application and verified that I am speaking with the correct person using two identifiers.  Location of all participants today Patient: at home Provider: at office   I discussed the limitations of evaluation and management by telemedicine and the availability of in person appointments. The patient expressed understanding and agreed to proceed.  History of Present Illness: Here to f/u; overall doing ok,  Pt denies chest pain, increasing sob or doe, wheezing, orthopnea, PND, increased LE swelling, palpitations, dizziness or syncope.  Pt denies new neurological symptoms such as new headache, or facial or extremity weakness or numbness.  Pt denies polydipsia, polyuria, Does have several wks ongoing nasal allergy symptoms with clearish congestion, itch and sneezing, without fever, pain, ST, cough, swelling or wheezing.  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  ALso has worsening finger joint pain not well controlled, with stiffness in the AM, better later in the day.   Pt denies fever, wt loss, night sweats, loss of appetite, or other constitutional symptoms Past Medical History:  Diagnosis Date  . ABDOMINAL PAIN OTHER SPECIFIED SITE 04/30/2008  . ALLERGIC RHINITIS 08/25/2007  . Anxiety 06/28/2010  . BENIGN PROSTATIC HYPERTROPHY 08/21/2006  . Chest pain 06/28/2010  . COLONIC POLYPS, HX OF 04/18/2007  . Complication of anesthesia    post op nausea and vomiting and wakes up too early per pt  . 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 Diverticulosis of colon (without mention of hemorrhage) 04/18/2007  . EXTERNAL HEMORRHOIDS 04/18/2007  . Headache(784.0) 09/20/2009    hx migraines  . HEARING LOSS, 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  . HYPERLIPIDEMIA 08/21/2006  . INSOMNIA-SLEEP DISORDER-UNSPEC 09/20/2009  . NONSPECIFIC ABN FINDING RAD & OTH EXAM GU ORGAN 05/03/2008  . PERIPHERAL VASCULAR DISEASE 08/25/2007  . PONV (postoperative nausea and vomiting)   . Raynaud's syndrome 08/21/2006   Past Surgical History:  Procedure Laterality Date  . APPENDECTOMY    . COLONOSCOPY  07/25/2006  . s/p knee surgery Bilateral    x's 4 since 1980  . s/p left shoulder  2002   rotator cuff  . s/p lumbar disc surgery  2007  . 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 TOTAL KNEE ARTHROPLASTY Right 03/26/2016   Procedure: RIGHT TOTAL KNEE ARTHROPLASTY;  Surgeon: Vickey Huger, MD;  Location: [facility];  Service: Orthopedics;  Laterality: Right;  . 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 His smoking use included cigarettes. He has a 15.00 pack-year smoking history. He has never used smokeless tobacco. He reports current alcohol use of about 4.0 standard drinks of alcohol per week. He reports that he does not use drugs. family history includes Alzheimer's disease in his mother; Diabetes in his maternal uncle and mother; Kidney cancer in his brother. Allergies  Allergen Reactions  . Iohexol Itching    Itching post 125cc Omni injection. Treated w/ 50 mg PO benedryl.  Onset Date: [id number]    Current Outpatient Medications on File Prior to  Visit  Medication Sig Dispense Refill  . aspirin 81 MG EC tablet Take 1 tablet (81 mg total) by mouth daily. Swallow whole. 30 tablet 12  . atorvastatin (LIPITOR) 10 MG tablet Take 1 tablet (10 mg total) by mouth daily. 90 tablet 3  . meloxicam (MOBIC) 15 MG tablet TAKE 1 TABLET BY MOUTH EVERY DAY 30 tablet 0  . terbinafine (LAMISIL) 250 MG tablet TAKE 1 TABLET BY MOUTH EVERY DAY 90 tablet  0  . traMADol (ULTRAM) 50 MG tablet Take 1 tablet (50 mg total) by mouth every 12 (twelve) hours as needed. 180 tablet 0  . zolpidem (AMBIEN) 10 MG tablet TAKE 1 TABLET BY MOUTH EVERYDAY AT BEDTIME 30 tablet 0   No current facility-administered medications on file prior to visit.    Observations/Objective: Alert, NAD, appropriate mood and affect, resps normal, cn 2-12 intact, moves all 4s, no visible rash or swelling Lab Results  Component Value Date   WBC 6.7 07/14/2018   HGB 14.5 07/14/2018   HCT 43.0 07/14/2018   PLT 177.0 07/14/2018   GLUCOSE 108 (H) 07/14/2018   CHOL 181 07/14/2018   TRIG (H) 07/14/2018    456.0 Triglyceride is over 400; calculations on Lipids are invalid.   HDL 55.20 07/14/2018   LDLDIRECT 100.0 07/14/2018   LDLCALC 93 09/12/2016   ALT 12 07/14/2018   AST 14 07/14/2018   NA 141 07/14/2018   K 4.2 07/14/2018   CL 103 07/14/2018   CREATININE 1.22 07/14/2018   BUN 24 (H) 07/14/2018   CO2 31 07/14/2018   TSH 1.63 07/14/2018   PSA 2.92 07/14/2018   INR 0.89 05/02/2012   HGBA1C 5.6 05/02/2012   Assessment and Plan: See notes  Follow Up Instructions: Seen notes   I discussed the assessment and treatment plan with the patient. The patient was provided an opportunity to ask questions and all were answered. The patient agreed with the plan and demonstrated an understanding of the instructions.   The patient was advised to call back or seek an in-person evaluation if the symptoms worsen or if the condition fails to improve as anticipated.  Cathlean Cower, MD

## 2019-07-26 ENCOUNTER — Encounter: Payer: Self-pay | Admitting: Internal Medicine

## 2019-07-26 DIAGNOSIS — M19049 Primary osteoarthritis, unspecified hand: Secondary | ICD-10-CM

## 2019-07-26 DIAGNOSIS — G5603 Carpal tunnel syndrome, bilateral upper limbs: Secondary | ICD-10-CM

## 2019-07-26 HISTORY — DX: Carpal tunnel syndrome, bilateral upper limbs: G56.03

## 2019-07-26 HISTORY — DX: Primary osteoarthritis, unspecified hand: M19.049

## 2019-07-26 NOTE — Assessment & Plan Note (Addendum)
Mild to mod, for zyrtec and nasacort with a short course of prednisone as well, to f/u any worsening symptoms or concerns  I spent 31 minutes in preparing to see the patient by review of recent labs, imaging and procedures, obtaining and reviewing separately obtained history, communicating with the patient and family or caregiver, ordering medications, tests or procedures, and documenting clinical information in the EHR including the differential Dx, treatment, and any further evaluation and other management of allergies, bilateral cts, hand arthritis

## 2019-07-26 NOTE — Assessment & Plan Note (Signed)
[place] for volt gel prn,  to f/u any worsening symptoms or concerns

## 2019-07-26 NOTE — Assessment & Plan Note (Signed)
Mild, for wrist splints qhs prn,  to f/u any worsening symptoms or concerns

## 2019-07-26 NOTE — Patient Instructions (Signed)
Please take all new medication as prescribed 

## 2019-07-31 ENCOUNTER — Telehealth: Payer: Self-pay | Admitting: Internal Medicine

## 2019-07-31 DIAGNOSIS — M19011 Primary osteoarthritis, right shoulder: Secondary | ICD-10-CM | POA: Diagnosis not present

## 2019-07-31 DIAGNOSIS — M19012 Primary osteoarthritis, left shoulder: Secondary | ICD-10-CM | POA: Diagnosis not present

## 2019-07-31 NOTE — Telephone Encounter (Signed)
    Patient calling to report toe nail fungus, states Dr Jenny Reichmann prescribed medication in the past for fungus. Patient is requesting refill on medication, however he did not know the name of antifungal cream

## 2019-08-03 MED ORDER — KETOCONAZOLE 2 % EX CREA: 1.0000 "application " | TOPICAL_CREAM | Freq: Every day | CUTANEOUS | 2 refills | Status: DC

## 2019-08-03 NOTE — Telephone Encounter (Signed)
Ok for [employer] cream

## 2019-08-03 NOTE — Telephone Encounter (Signed)
Sent to Dr. John. 

## 2019-08-05 ENCOUNTER — Ambulatory Visit (INDEPENDENT_AMBULATORY_CARE_PROVIDER_SITE_OTHER): Payer: Medicare HMO | Admitting: Internal Medicine

## 2019-08-05 ENCOUNTER — Encounter: Payer: Self-pay | Admitting: Internal Medicine

## 2019-08-05 ENCOUNTER — Other Ambulatory Visit: Payer: Self-pay

## 2019-08-05 VITALS — BP 130/78 | HR 64 | Temp 97.8°F | Ht 71.0 in | Wt 171.0 lb

## 2019-08-05 DIAGNOSIS — R739 Hyperglycemia, unspecified: Secondary | ICD-10-CM | POA: Diagnosis not present

## 2019-08-05 DIAGNOSIS — Z0000 Encounter for general adult medical examination without abnormal findings: Secondary | ICD-10-CM | POA: Diagnosis not present

## 2019-08-05 HISTORY — DX: Hyperglycemia, unspecified: R73.9

## 2019-08-05 NOTE — Assessment & Plan Note (Signed)
stable overall by history and exam, recent data reviewed with pt, and pt to continue medical treatment as before,  to f/u any worsening symptoms or concerns  

## 2019-08-05 NOTE — Progress Notes (Signed)
Subjective:    Patient ID: Jason Robles, male    DOB: Jan 06, 1945, 74 y.o.   MRN: [id number]  HPI  Here for wellness and f/u;  Overall doing ok;  Pt denies Chest pain, worsening SOB, DOE, wheezing, orthopnea, PND, worsening LE edema, palpitations, dizziness or syncope.  Pt denies neurological change such as new headache, facial or extremity weakness.  Pt denies polydipsia, polyuria, or low sugar symptoms. Pt states overall good compliance with treatment and medications, good tolerability, and has been trying to follow appropriate diet.  Pt denies worsening depressive symptoms, suicidal ideation or panic. No fever, night sweats, wt loss, loss of appetite, or other constitutional symptoms.  Pt states good ability with ADL's, has low fall risk, home safety reviewed and adequate, no other significant changes in hearing or vision, and only occasionally active with exercise Lost wt with better diet.   Wt Readings from Last 3 Encounters:  08/05/19 171 lb (77.6 kg)  04/15/19 178 lb 9.6 oz (81 kg)  07/31/18 180 lb (81.6 kg)   Past Medical History:  Diagnosis Date  . ABDOMINAL PAIN OTHER SPECIFIED SITE 04/30/2008  . ALLERGIC RHINITIS 08/25/2007  . Anxiety 06/28/2010  . BENIGN PROSTATIC HYPERTROPHY 08/21/2006  . Chest pain 06/28/2010  . COLONIC POLYPS, HX OF 04/18/2007  . Complication of anesthesia    post op nausea and vomiting and wakes up too early per pt  . 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 Diverticulosis of colon (without mention of hemorrhage) 04/18/2007  . EXTERNAL HEMORRHOIDS 04/18/2007  . Headache(784.0) 09/20/2009   hx migraines  . HEARING LOSS, 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  . HYPERLIPIDEMIA 08/21/2006  . INSOMNIA-SLEEP DISORDER-UNSPEC 09/20/2009  . NONSPECIFIC ABN FINDING RAD & OTH EXAM GU ORGAN 05/03/2008  . PERIPHERAL VASCULAR DISEASE 08/25/2007  . PONV (postoperative nausea and vomiting)   . Raynaud's syndrome 08/21/2006   Past Surgical History:  Procedure Laterality Date   . APPENDECTOMY    . COLONOSCOPY  07/25/2006  . s/p knee surgery Bilateral    x's 4 since 1980  . s/p left shoulder  2002   rotator cuff  . s/p lumbar disc surgery  2007  . 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 TOTAL KNEE ARTHROPLASTY Right 03/26/2016   Procedure: RIGHT TOTAL KNEE ARTHROPLASTY;  Surgeon: Vickey Huger, MD;  Location: [facility];  Service: Orthopedics;  Laterality: Right;  . 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 His smoking use included cigarettes. He has a 15.00 pack-year smoking history. He has never used smokeless tobacco. He reports current alcohol use of about 4.0 standard drinks of alcohol per week. He reports that he does not use drugs. family history includes Alzheimer's disease in his mother; Diabetes in his maternal uncle and mother; Kidney cancer in his brother. Allergies  Allergen Reactions  . Iohexol Itching    Itching post 125cc Omni injection. Treated w/ 50 mg PO benedryl.  Onset Date: [id number]    Current Outpatient Medications on File Prior to Visit  Medication Sig Dispense Refill  . aspirin 81 MG EC tablet Take 1 tablet (81 mg total) by mouth daily. Swallow whole. 30 tablet 12  . atorvastatin (LIPITOR) 10 MG tablet Take 1 tablet (10 mg total) by mouth daily. 90 tablet 3  . cetirizine (ZYRTEC) 10 MG tablet Take 1 tablet (  10 mg total) by mouth daily. 30 tablet 11  . ketoconazole (NIZORAL) 2 % cream Apply 1 application topically daily. 30 g 2  . meloxicam (MOBIC) 15 MG tablet TAKE 1 TABLET BY MOUTH EVERY DAY 30 tablet 0  . predniSONE (DELTASONE) 10 MG tablet 2  tabs by mouth per day for 7 days 14 tablet 0  . terbinafine (LAMISIL) 250 MG tablet TAKE 1 TABLET BY MOUTH EVERY DAY 90 tablet 0  . traMADol (ULTRAM) 50 MG tablet Take 1 tablet (50 mg total) by mouth every 12 (twelve) hours as  needed. 180 tablet 0  . triamcinolone (NASACORT) 55 MCG/ACT AERO nasal inhaler Place 2 sprays into the nose daily. 1 Inhaler 12  . zolpidem (AMBIEN) 10 MG tablet TAKE 1 TABLET BY MOUTH EVERYDAY AT BEDTIME 30 tablet 0   No current facility-administered medications on file prior to visit.   Review of Systems All otherwise neg per pt     Objective:   Physical Exam BP 130/78 (BP Location: Left Arm, Patient Position: Sitting, Cuff Size: Large)   Pulse 64   Temp 97.8 F (36.6 C) (Oral)   Ht 5\' 11"  (1.803 m)   Wt 171 lb (77.6 kg)   SpO2 97%   BMI 23.85 kg/m  VS noted,  Constitutional: Pt appears in NAD HENT: Head: NCAT.  Right Ear: External ear normal.  Left Ear: External ear normal.  Eyes: . Pupils are equal, round, and reactive to light. Conjunctivae and EOM are normal Nose: without d/c or deformity Neck: Neck supple. Gross normal ROM Cardiovascular: Normal rate and regular rhythm.   Pulmonary/Chest: Effort normal and breath sounds without rales or wheezing.  Abd:  Soft, NT, ND, + BS, no organomegaly Neurological: Pt is alert. At baseline orientation, motor grossly intact Skin: Skin is warm. No rashes, other new lesions, no LE edema Psychiatric: Pt behavior is normal without agitation  All otherwise neg per pt Lab Results  Component Value Date   WBC 6.7 07/14/2018   HGB 14.5 07/14/2018   HCT 43.0 07/14/2018   PLT 177.0 07/14/2018   GLUCOSE 108 (H) 07/14/2018   CHOL 181 07/14/2018   TRIG (H) 07/14/2018    456.0 Triglyceride is over 400; calculations on Lipids are invalid.   HDL 55.20 07/14/2018   LDLDIRECT 100.0 07/14/2018   LDLCALC 93 09/12/2016   ALT 12 07/14/2018   AST 14 07/14/2018   NA 141 07/14/2018   K 4.2 07/14/2018   CL 103 07/14/2018   CREATININE 1.22 07/14/2018   BUN 24 (H) 07/14/2018   CO2 31 07/14/2018   TSH 1.63 07/14/2018   PSA 2.92 07/14/2018   INR 0.89 05/02/2012   HGBA1C 5.6 05/02/2012          Assessment & Plan:

## 2019-08-05 NOTE — Patient Instructions (Signed)

## 2019-08-05 NOTE — Assessment & Plan Note (Signed)

## 2019-08-06 LAB — LIPID PANEL
Cholesterol: 181 mg/dL (ref <200)
HDL: 72 mg/dL (ref >=40)
LDL Cholesterol (Calc): 90 mg/dL (calc)
Non-HDL Cholesterol (Calc): 109 mg/dL (calc) (ref <130)
Total CHOL/HDL Ratio: 2.5 (calc) (ref <5.0)
Triglycerides: 95 mg/dL (ref <150)

## 2019-08-06 LAB — COMPLETE METABOLIC PANEL WITH GFR
AG Ratio: 1.6 (calc) (ref 1.0–2.5)
ALT: 14 U/L (ref 9–46)
AST: 15 U/L (ref 10–35)
Albumin: 4.5 g/dL (ref 3.6–5.1)
Alkaline phosphatase (APISO): 66 U/L (ref 35–144)
BUN/Creatinine Ratio: 25 (calc) — ABNORMAL HIGH (ref 6–22)
BUN: 27 mg/dL — ABNORMAL HIGH (ref 7–25)
CO2: 30 mmol/L (ref 20–32)
Calcium: 10.1 mg/dL (ref 8.6–10.3)
Chloride: 98 mmol/L (ref 98–110)
Creat: 1.06 mg/dL (ref 0.70–1.18)
GFR, Est African American: 81 mL/min/{1.73_m2} (ref >=60)
GFR, Est Non African American: 70 mL/min/{1.73_m2} (ref >=60)
Globulin: 2.9 g/dL (calc) (ref 1.9–3.7)
Glucose, Bld: 89 mg/dL (ref 65–99)
Potassium: 4.2 mmol/L (ref 3.5–5.3)
Sodium: 137 mmol/L (ref 135–146)
Total Bilirubin: 0.7 mg/dL (ref 0.2–1.2)
Total Protein: 7.4 g/dL (ref 6.1–8.1)

## 2019-08-06 LAB — CBC WITH DIFFERENTIAL/PLATELET
Absolute Monocytes: 606 cells/uL (ref 200–950)
Basophils Absolute: 29 cells/uL (ref 0–200)
Basophils Relative: 0.4 %
Eosinophils Absolute: 110 cells/uL (ref 15–500)
Eosinophils Relative: 1.5 %
HCT: 45.3 % (ref 38.5–50.0)
Hemoglobin: 15.3 g/dL (ref 13.2–17.1)
Lymphs Abs: 2504 cells/uL (ref 850–3900)
MCH: 29.9 pg (ref 27.0–33.0)
MCHC: 33.8 g/dL (ref 32.0–36.0)
MCV: 88.5 fL (ref 80.0–100.0)
MPV: 9.3 fL (ref 7.5–12.5)
Monocytes Relative: 8.3 %
Neutro Abs: 4052 cells/uL (ref 1500–7800)
Neutrophils Relative %: 55.5 %
Platelets: 185 10*3/uL (ref 140–400)
RBC: 5.12 10*6/uL (ref 4.20–5.80)
RDW: 13.2 % (ref 11.0–15.0)
Total Lymphocyte: 34.3 %
WBC: 7.3 10*3/uL (ref 3.8–10.8)

## 2019-08-06 LAB — URINALYSIS, ROUTINE W REFLEX MICROSCOPIC
Bilirubin Urine: NEGATIVE
Glucose, UA: NEGATIVE
Hgb urine dipstick: NEGATIVE
Ketones, ur: NEGATIVE
Leukocytes,Ua: NEGATIVE
Nitrite: NEGATIVE
Protein, ur: NEGATIVE
Specific Gravity, Urine: 1.018 (ref 1.001–1.03)
pH: 6.5 (ref 5.0–8.0)

## 2019-08-06 LAB — TSH: TSH: 1 mIU/L (ref 0.40–4.50)

## 2019-08-06 LAB — PSA: PSA: 2.4 ng/mL (ref <=4.0)

## 2019-08-06 LAB — HEMOGLOBIN A1C
Hgb A1c MFr Bld: 5.8 % of total Hgb — ABNORMAL HIGH (ref <5.7)
Mean Plasma Glucose: 120 (calc)
eAG (mmol/L): 6.6 (calc)

## 2019-08-07 ENCOUNTER — Encounter: Payer: Self-pay | Admitting: Internal Medicine

## 2019-09-07 ENCOUNTER — Other Ambulatory Visit: Payer: Self-pay | Admitting: Internal Medicine

## 2019-09-07 NOTE — Telephone Encounter (Signed)
Done erx 

## 2019-10-01 DIAGNOSIS — R69 Illness, unspecified: Secondary | ICD-10-CM | POA: Diagnosis not present

## 2019-10-16 ENCOUNTER — Telehealth: Payer: Self-pay | Admitting: Internal Medicine

## 2019-10-16 NOTE — Telephone Encounter (Signed)
Called pt no answer. LDVM for pt to please go to UC or ED ASAP due to the chest pressure.

## 2019-10-16 NOTE — Telephone Encounter (Signed)
Sent to Dr. John. 

## 2019-10-16 NOTE — Telephone Encounter (Signed)
[place] for any provider, even sat clinic, or to UC if worsening sooner, but I cannot overbook

## 2019-10-16 NOTE — Telephone Encounter (Signed)
[facility] Report/Call: ---Caller has chest pressure. started 3 weeks ago in the center of the chest comes and goes. Has been working out and working on the chest area a little more. Denies being light headed. or sob. Rates pain 2/10 on scale.  Advised see PCP within 24 hours.

## 2019-10-16 NOTE — Telephone Encounter (Signed)
Triage nurse called and stated patient is having intermitting chest pain for the past 3 weeks. Patient does work out and it may have something to do with that.   Advised see PCP within 24 hours.  We do not have any open appointments. Please advise if patient can be double booked or ok to be seen next week.   Please follow up with patient. Thank you.

## 2019-10-17 ENCOUNTER — Encounter: Payer: Self-pay | Admitting: Family Medicine

## 2019-10-17 ENCOUNTER — Telehealth (INDEPENDENT_AMBULATORY_CARE_PROVIDER_SITE_OTHER): Payer: Medicare HMO | Admitting: Family Medicine

## 2019-10-17 DIAGNOSIS — R0789 Other chest pain: Secondary | ICD-10-CM

## 2019-10-17 NOTE — Progress Notes (Signed)
Patient ID: Jason Robles, male   DOB: 03-17-1945, 74 y.o.   MRN: [id number]  This visit type was conducted due to national recommendations for restrictions regarding the COVID-19 pandemic in an effort to limit this patient's exposure and mitigate transmission in our community.   Virtual Visit via Telephone Note  I connected with Jason Robles on 10/17/19 at 11:00 AM EDT by telephone and verified that I am speaking with the correct person using two identifiers.   I discussed the limitations, risks, security and privacy concerns of performing an evaluation and management service by telephone and the availability of in person appointments. I also discussed with the patient that there may be a patient responsible charge related to this service. The patient expressed understanding and agreed to proceed.  Location patient: home Location provider: work or home office Participants present for the call: patient, provider Patient did not have a visit in the prior 7 days to address this/these issue(s).   History of Present Illness:  Jason Robles called with roughly 3-week history of some intermittent substernal chest tightness.  He has started back some regular exercise with weightlifting several weeks ago and initially wondered if this was related to his exercise.  However, he has not had any muscle soreness or tenderness to palpation.  He states he has had generally couple episodes per day usually lasting very briefly about 3 minutes of substernal chest pressure with some radiation toward epigastrium.  No radiation into the neck or upper extremity.  No nausea.  He had one episode where he had some mild diaphoresis.  No dizziness.  No crescendo pattern.  He does not really do any regular aerobic type exercise.  He has been doing fairly physical yard work recently including putting up some gutter guards and going up and down ladders.  He does recall couple episodes with mild discomfort related to  that but not consistently with activity.  No recent GERD symptoms.  He is not aware of any family history of premature CAD- though his father's health history is relatively unknown.  Patient has history of hyperlipidemia treated with Lipitor.  He also takes baby aspirin daily.  No history of diabetes or hypertension.  Non-smoker.  There was some question of history of TIA several years ago.  Patient had carotid Dopplers 3/19 which showed only mild internal carotid stenosis.  He had echocardiogram 5/14 with ejection fraction 60-65% and relatively normal.  Denies any prior stress testing  He has a picture framing business.  His work is generally nonstrenuous.  Past Medical History:  Diagnosis Date  . ABDOMINAL PAIN OTHER SPECIFIED SITE 04/30/2008  . ALLERGIC RHINITIS 08/25/2007  . Anxiety 06/28/2010  . BENIGN PROSTATIC HYPERTROPHY 08/21/2006  . Chest pain 06/28/2010  . COLONIC POLYPS, HX OF 04/18/2007  . Complication of anesthesia    post op nausea and vomiting and wakes up too early per pt  . 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 Diverticulosis of colon (without mention of hemorrhage) 04/18/2007  . EXTERNAL HEMORRHOIDS 04/18/2007  . Headache(784.0) 09/20/2009   hx migraines  . HEARING LOSS, 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  . HYPERLIPIDEMIA 08/21/2006  . INSOMNIA-SLEEP DISORDER-UNSPEC 09/20/2009  . NONSPECIFIC ABN FINDING RAD & OTH EXAM GU ORGAN 05/03/2008  . PERIPHERAL VASCULAR DISEASE 08/25/2007  . PONV (postoperative nausea and vomiting)   . Raynaud's syndrome 08/21/2006   Past Surgical History:  Procedure Laterality Date  . APPENDECTOMY    . COLONOSCOPY  07/25/2006  . s/p knee surgery Bilateral  x's 4 since 1980  . s/p left shoulder  2002   rotator cuff  . s/p lumbar disc surgery  2007  . 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 TOTAL KNEE ARTHROPLASTY Right 03/26/2016   Procedure: RIGHT TOTAL KNEE ARTHROPLASTY;  Surgeon: Vickey Huger, MD;  Location: [facility];  Service:  Orthopedics;  Laterality: Right;  . 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 His smoking use included cigarettes. He has a 15.00 pack-year smoking history. He has never used smokeless tobacco. He reports current alcohol use of about 4.0 standard drinks of alcohol per week. He reports that he does not use drugs. family history includes Alzheimer's disease in his mother; Diabetes in his maternal uncle and mother; Kidney cancer in his brother. Allergies  Allergen Reactions  . Iohexol Itching    Itching post 125cc Omni injection. Treated w/ 50 mg PO benedryl.  Onset Date: [id number]       Observations/Objective: Patient sounds cheerful and well on the phone. I do not appreciate any SOB. Speech and thought processing are grossly intact. Patient reported vitals:  Assessment and Plan:  Patient relates 3-week history of intermittent substernal chest tightness both at rest and occasionally with activity.  Symptoms are very transient lasting generally just a few minutes.  Symptoms are worrisome and need further evaluation.  It appears he has at least moderate risk factors for CAD  -We recommended prompt cardiology referral for further evaluation -We discussed the fact that he needs to go to the ER immediately for any progressive symptoms in terms of frequency or duration or severity. -Continue aspirin 1 daily -Continue Lipitor -We advised against any exercise or strenuous physical activity until further evaluated  Follow Up Instructions:  -As above.  Placing urgent referral to cardiology   [phone] 11-20 [id number] 21-30 I did not refer this patient for an OV in the next 24 hours for this/these issue(s).  I discussed the assessment and treatment plan with the patient. The patient was provided an opportunity to ask  questions and all were answered. The patient agreed with the plan and demonstrated an understanding of the instructions.   The patient was advised to call back or seek an in-person evaluation if the symptoms worsen or if the condition fails to improve as anticipated.  I provided 26 minutes of non-face-to-face time during this encounter.   Carolann Littler, MD

## 2019-10-22 ENCOUNTER — Ambulatory Visit: Payer: Medicare HMO | Admitting: Interventional Cardiology

## 2019-10-22 ENCOUNTER — Other Ambulatory Visit: Payer: Self-pay

## 2019-10-22 ENCOUNTER — Encounter: Payer: Self-pay | Admitting: Interventional Cardiology

## 2019-10-22 VITALS — BP 114/64 | HR 63 | Ht 71.0 in | Wt 181.6 lb

## 2019-10-22 DIAGNOSIS — R072 Precordial pain: Secondary | ICD-10-CM

## 2019-10-22 DIAGNOSIS — E782 Mixed hyperlipidemia: Secondary | ICD-10-CM | POA: Diagnosis not present

## 2019-10-22 MED ORDER — NITROGLYCERIN 0.4 MG SL SUBL: 0.4000 mg | SUBLINGUAL_TABLET | SUBLINGUAL | 3 refills | Status: AC | PRN

## 2019-10-22 MED ORDER — PREDNISONE 50 MG PO TABS: ORAL_TABLET | ORAL | 0 refills | Status: DC

## 2019-10-22 MED ORDER — METOPROLOL TARTRATE 25 MG PO TABS
ORAL_TABLET | ORAL | 0 refills | Status: DC
Start: 2019-10-22 — End: 2021-06-06

## 2019-10-22 MED ORDER — DIPHENHYDRAMINE HCL 50 MG PO TABS: ORAL_TABLET | ORAL | 0 refills | Status: DC

## 2019-10-22 NOTE — Patient Instructions (Addendum)
Medication Instructions:  Your physician has recommended you make the following change in your medication:   START: sublingual Nitroglycerin 0.4 mg tablet: Use as directed AS NEEDED for chest pain  *If you need a refill on your cardiac medications before your next appointment, please call your pharmacy*   Lab Work: TODAY: BMET  If you have labs (blood work) drawn today and your tests are completely normal, you will receive your results only by: [name] MyChart Message (if you have MyChart) OR . A paper copy in the mail If you have any lab test that is abnormal or we need to change your treatment, we will call you to review the results.   Testing/Procedures: Your physician has requested that you have cardiac CT.   Follow-Up: Based on test results   Other Instructions  Your cardiac CT will be scheduled at one of the below locations:   [facility] [address] [place], [place] [address] [phone]  [facility] [address] [place], [place] [address] [phone]  If scheduled at [facility], please arrive at the [facility] main entrance of [facility] 30 minutes prior to test start time. Proceed to the [facility] Radiology Department (first floor) to check-in and test prep.  If scheduled at [facility], please arrive 15 mins early for check-in and test prep.  Please follow these instructions carefully (unless otherwise directed):  Hold all erectile dysfunction medications at least 3 days (72 hrs) prior to test.  On the Night Before the Test: . Be sure to Drink plenty of water. . Do not consume any caffeinated/decaffeinated beverages or chocolate 12 hours prior to your test. . Do not take any antihistamines 12 hours prior to your test. . Since you have an allergy to [facility], you will need to follow these instructions below: 1. Prednisone 50 mg - take 13 hours  prior to test 2. Take another Prednisone 50 mg 7 hours prior to test 3. Take another Prednisone 50 mg 1 hour prior to test 4. Take Benadryl 50 mg 1 hour prior to test . You must complete all four doses of above prophylactic medications. . You will need a ride after test due to Benadryl.  On the Day of the Test: . Drink plenty of water. Do not drink any water within one hour of the test. . Do not eat any food 4 hours prior to the test. . You may take your regular medications prior to the test.  . Take metoprolol (Lopressor) 25 MG two hours prior to test.       After the Test: . Drink plenty of water. . After receiving IV contrast, you may experience a mild flushed feeling. This is normal. . On occasion, you may experience a mild rash up to 24 hours after the test. This is not dangerous. If this occurs, you can take Benadryl 25 mg and increase your fluid intake. . If you experience trouble breathing, this can be serious. If it is severe call 911 IMMEDIATELY. If it is mild, please call our office.   Once we have confirmed authorization from your insurance company, we will call you to set up a date and time for your test. Based on how quickly your insurance processes prior authorizations requests, please allow up to 4 weeks to be contacted for scheduling your Cardiac CT appointment. Be advised that routine Cardiac CT appointments could be scheduled as many as 8  weeks after your provider has ordered it.  For non-scheduling related questions, please contact the cardiac imaging nurse navigator should you have any questions/concerns: [name], Cardiac Imaging Nurse Navigator Burley Saver, Interim Cardiac Imaging Nurse [facility] Direct Office Dial: [phone]   For scheduling needs, including cancellations and rescheduling, please call Vivien Rota at [phone], option 3.

## 2019-10-22 NOTE — Progress Notes (Signed)
Cardiology Office Note   Date:  10/22/2019   ID:  Jason Robles, DOB 04/29/1945, MRN [id number]  PCP:  Biagio Borg, MD    No chief complaint on file.  Chest pressure  Wt Readings from Last 3 Encounters:  10/22/19 181 lb 9.6 oz (82.4 kg)  08/05/19 171 lb (77.6 kg)  04/15/19 178 lb 9.6 oz (81 kg)       History of Present Illness: Jason Robles is a 74 y.o. male who is being seen today for the evaluation of chest pressure at the request of Eulas Post, MD.  Over the past few weeks, he has had some chest pressure.  It is in the lower chest.  It is not a severe pain.  No particular triigger for the sx.  No sx with exertion on a consistent basis.   Sx can resolve after a few minutes.  No prolonged episodes.   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  He does a lot of work at home in the yard.  He never had to stop an activity due to the pressure.  3/10 is the worst it has been.  Passed a treadmill test several years ago.  No family h/o CAD.  He quit smoking in 1982.   Denies :  Dizziness. Leg edema. Nitroglycerin use. Orthopnea. Palpitations. Paroxysmal nocturnal dyspnea. Shortness of breath. Syncope.   Past Medical History:  Diagnosis Date  . ABDOMINAL PAIN OTHER SPECIFIED SITE 04/30/2008  . ALLERGIC RHINITIS 08/25/2007  . Anxiety 06/28/2010  . BENIGN PROSTATIC HYPERTROPHY 08/21/2006  . Chest pain 06/28/2010  . COLONIC POLYPS, HX OF 04/18/2007  . Complication of anesthesia    post op nausea and vomiting and wakes up too early per pt  . 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 Diverticulosis of colon (without mention of hemorrhage) 04/18/2007  . EXTERNAL HEMORRHOIDS 04/18/2007  . Headache(784.0) 09/20/2009   hx migraines  . HEARING LOSS, 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  . HYPERLIPIDEMIA 08/21/2006  . INSOMNIA-SLEEP DISORDER-UNSPEC 09/20/2009  . NONSPECIFIC ABN FINDING RAD & OTH EXAM GU ORGAN 05/03/2008  . PERIPHERAL VASCULAR DISEASE  08/25/2007  . PONV (postoperative nausea and vomiting)   . Raynaud's syndrome 08/21/2006    Past Surgical History:  Procedure Laterality Date  . APPENDECTOMY    . COLONOSCOPY  07/25/2006  . s/p knee surgery Bilateral    x's 4 since 1980  . s/p left shoulder  2002   rotator cuff  . s/p lumbar disc surgery  2007  . 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 TOTAL KNEE ARTHROPLASTY Right 03/26/2016   Procedure: RIGHT TOTAL KNEE ARTHROPLASTY;  Surgeon: Vickey Huger, MD;  Location: [facility];  Service: Orthopedics;  Laterality: Right;  . 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 aspirin 81 MG EC tablet Take 1 tablet (81 mg total) by mouth daily. Swallow whole. 30 tablet 12  . atorvastatin (LIPITOR) 10 MG tablet Take 1 tablet (10 mg total) by mouth daily. 90 tablet 3  . ketoconazole (NIZORAL) 2 % cream Apply 1 application topically daily. 30 g 2  . meloxicam (MOBIC) 15 MG tablet TAKE 1 TABLET BY MOUTH EVERY DAY 90 tablet 2  . traMADol (ULTRAM) 50 MG tablet Take 1 tablet (50 mg total) by  mouth every 12 (twelve) hours as needed. 180 tablet 0  . triamcinolone (NASACORT) 55 MCG/ACT AERO nasal inhaler Place 2 sprays into the nose daily. 1 Inhaler 12  . zolpidem (AMBIEN) 10 MG tablet TAKE 1 TABLET BY MOUTH EVERYDAY AT BEDTIME 90 tablet 1   No current facility-administered medications for this visit.    Allergies:   Iohexol    Social History:  The patient  reports that he quit smoking about 39 years ago. His smoking use included cigarettes. He has a 15.00 pack-year smoking history. He has never used smokeless tobacco. He reports current alcohol use of about 4.0 standard drinks of alcohol per week. He reports that he does not use drugs.   Family History:  The patient's family history includes Alzheimer's  disease in his mother; Diabetes in his maternal uncle and mother; Kidney cancer in his brother.    ROS:  Please see the history of present illness.   Otherwise, review of systems are positive for recent chest pain; no problems with going up stairs.   All other systems are reviewed and negative.    PHYSICAL EXAM: VS:  BP 114/64   Pulse 63   Ht 5\' 11"  (1.803 m)   Wt 181 lb 9.6 oz (82.4 kg)   SpO2 93%   BMI 25.33 kg/m  , BMI Body mass index is 25.33 kg/m. GEN: Well nourished, well developed, in no acute distress  HEENT: normal  Neck: no JVD, carotid bruits, or masses Cardiac: RRR; no murmurs, rubs, or gallops,no edema  Respiratory:  clear to auscultation bilaterally, normal work of breathing GI: soft, nontender, nondistended, + BS MS: no deformity or atrophy  Skin: warm and dry, no rash Neuro:  Strength and sensation are intact Psych: euthymic mood, full affect   EKG:   The ekg ordered today demonstrates normal ECG   Recent Labs: 08/05/2019: ALT 14; BUN 27; Creat 1.06; Hemoglobin 15.3; Platelets 185; Potassium 4.2; Sodium 137; TSH 1.00   Lipid Panel    Component Value Date/Time   CHOL 181 08/05/2019 1001   TRIG 95 08/05/2019 1001   HDL 72 08/05/2019 1001   CHOLHDL 2.5 08/05/2019 1001   VLDL 34.2 09/12/2016 1132   LDLCALC 90 08/05/2019 1001   LDLDIRECT 100.0 07/14/2018 1528     Other studies Reviewed: Additional studies/ records that were reviewed today with results demonstrating: labs reviewed, Cr 1.06.   ASSESSMENT AND PLAN:  1. Precordial chest pain:  Sx have some typical and atypical sx.  He has had some chest pressure.  We discussed cath vs. CTA.  Given that he does well with many forms of activity, will plan for noninvasive test.  He is in agreement.  Will give Rx for SL NTG just in case.   2. Hyperlipidemia: LDL 90. COntinue atorvastatin.  3. Premed for CT scan.  He only had itching, no hives, but will follow protocol.  Metoprolol 25 mg x 1 before scan.     Current medicines are reviewed at length with the patient today.  The patient concerns regarding his medicines were addressed.  The following changes have been made:  No change  Labs/ tests ordered today include:  No orders of the defined types were placed in this encounter.   Recommend 150 minutes/week of aerobic exercise Low fat, low carb, high fiber diet recommended  Disposition:   FU based on CT scan result.   Signed, Larae Grooms, MD  10/22/2019 4:00 PM    [facility]  [address] [place], [place]  [address] Phone: [phone]; Fax: [phone]

## 2019-10-23 LAB — BASIC METABOLIC PANEL
BUN/Creatinine Ratio: 21 (ref 10–24)
BUN: 19 mg/dL (ref 8–27)
CO2: 29 mmol/L (ref 20–29)
Calcium: 9.5 mg/dL (ref 8.6–10.2)
Chloride: 103 mmol/L (ref 96–106)
Creatinine, Ser: 0.92 mg/dL (ref 0.76–1.27)
GFR calc Af Amer: 96 mL/min/{1.73_m2} (ref >59)
GFR calc non Af Amer: 83 mL/min/{1.73_m2} (ref >59)
Glucose: 83 mg/dL (ref 65–99)
Potassium: 5 mmol/L (ref 3.5–5.2)
Sodium: 142 mmol/L (ref 134–144)

## 2019-11-04 ENCOUNTER — Telehealth (HOSPITAL_COMMUNITY): Payer: Self-pay | Admitting: Emergency Medicine

## 2019-11-04 NOTE — Telephone Encounter (Signed)
Reaching out to patient to offer assistance regarding upcoming cardiac imaging study; pt verbalizes understanding of appt date/time, parking situation and where to check in, pre-test NPO status and medications ordered, and verified current allergies; name and call back number provided for further questions should they arise Jason Bond RN Navigator Cardiac Imaging [facility] [phone] office [phone] cell  Reviewed 13 hr prep medications and timing. Also to take PO metoprolol 2 hr prior to scan. Pt verbalized understanding Jason Robles

## 2019-11-05 ENCOUNTER — Telehealth (HOSPITAL_COMMUNITY): Payer: Self-pay | Admitting: Emergency Medicine

## 2019-11-05 NOTE — Telephone Encounter (Signed)
Pt calling to clarify the times to take 13 hr contrast allergy prep medications Pt verbalized understanding [name] RN Navigator Cardiac Imaging [facility] [phone] Office  [phone] Cell

## 2019-11-06 ENCOUNTER — Telehealth: Payer: Self-pay | Admitting: Interventional Cardiology

## 2019-11-06 ENCOUNTER — Other Ambulatory Visit: Payer: Self-pay

## 2019-11-06 ENCOUNTER — Ambulatory Visit (HOSPITAL_COMMUNITY)
Admission: RE | Admit: 2019-11-06 | Discharge: 2019-11-06 | Disposition: A | Discharge status: Home / Self Care | Payer: Medicare HMO | Source: Ambulatory Visit | Attending: Interventional Cardiology | Admitting: Interventional Cardiology

## 2019-11-06 ENCOUNTER — Encounter: Payer: Self-pay | Admitting: *Deleted

## 2019-11-06 DIAGNOSIS — R072 Precordial pain: Secondary | ICD-10-CM | POA: Insufficient documentation

## 2019-11-06 DIAGNOSIS — Z006 Encounter for examination for normal comparison and control in clinical research program: Secondary | ICD-10-CM

## 2019-11-06 MED ORDER — NITROGLYCERIN 0.4 MG SL SUBL
SUBLINGUAL_TABLET | SUBLINGUAL | Status: AC
  Administered 2019-11-06: 0.4 mg
  Filled 2019-11-06: qty 2

## 2019-11-06 MED ORDER — NITROGLYCERIN 0.4 MG SL SUBL: 0.8000 mg | SUBLINGUAL_TABLET | SUBLINGUAL | Status: DC | PRN

## 2019-11-06 MED ORDER — NITROGLYCERIN 0.4 MG SL SUBL
SUBLINGUAL_TABLET | SUBLINGUAL | Status: AC
  Filled 2019-11-06: qty 1

## 2019-11-06 MED ORDER — IOHEXOL 350 MG/ML SOLN
160.0000 mL | Freq: Once | INTRAVENOUS | Status: AC | PRN
  Administered 2019-11-06: 160 mL via INTRAVENOUS

## 2019-11-06 MED ORDER — NITROGLYCERIN 0.4 MG SL SUBL
0.8000 mg | SUBLINGUAL_TABLET | Freq: Once | SUBLINGUAL | Status: AC
  Administered 2019-11-06: 0.8 mg via SUBLINGUAL

## 2019-11-06 NOTE — Telephone Encounter (Signed)
      I went in pt chart to see who called him today.  I transferred the call to [place]

## 2019-11-06 NOTE — Research (Signed)
IDENTIFY Informed Consent                  Subject Name:   Jason Robles   Subject met inclusion and exclusion criteria.  The informed consent form, study requirements and expectations were reviewed with the subject and questions and concerns were addressed prior to the signing of the consent form.  The subject verbalized understanding of the trial requirements.  The subject agreed to participate in the IDENTIFY trial and signed the informed consent.  The informed consent was obtained prior to performance of any protocol-specific procedures for the subject.  A copy of the signed informed consent was given to the subject and a copy was placed in the subject's medical record.   [place] Jacilyn Sanpedro, Research Assistant  11/06/2019  08:30 a,m.

## 2019-11-10 ENCOUNTER — Telehealth (INDEPENDENT_AMBULATORY_CARE_PROVIDER_SITE_OTHER): Payer: Medicare HMO | Admitting: Family Medicine

## 2019-11-10 DIAGNOSIS — R0981 Nasal congestion: Secondary | ICD-10-CM | POA: Diagnosis not present

## 2019-11-10 DIAGNOSIS — R0789 Other chest pain: Secondary | ICD-10-CM | POA: Diagnosis not present

## 2019-11-10 DIAGNOSIS — R059 Cough, unspecified: Secondary | ICD-10-CM | POA: Diagnosis not present

## 2019-11-10 MED ORDER — BENZONATATE 100 MG PO CAPS: 100.0000 mg | ORAL_CAPSULE | Freq: Three times a day (TID) | ORAL | 0 refills | Status: DC | PRN

## 2019-11-10 NOTE — Progress Notes (Signed)
Virtual Visit via Video Note  I connected with Jason Robles  on 11/10/19 at 10:40 AM EST by a video enabled telemedicine application and verified that I am speaking with the correct person using two identifiers.  Location patient: home, [place] Location provider:work or home office Persons participating in the virtual visit: patient, provider  I discussed the limitations of evaluation and management by telemedicine and the availability of in person appointments. The patient expressed understanding and agreed to proceed.   HPI:  Acute telemedicine visit for several issues:  1) nasal congestion: -Onset:last few days - works outside a lot -Symptoms include:runny nose, PND, cough -Denies:fever, change in baseline body aches, SOB, NVD -no known sick contact -Has tried: nothing -Pertinent past medical history: Allergic rhinitis -Pertinent medication allergies:Oohexol -COVID-19 vaccine status: fully vaccinated + booster for and and flu shot  2)chronic chest discomfort: -started about 1.5-2 months ago -saw cardiologist and had extensive eval including CT and told not cardiac related -notices chest discomfort intermittently, sometimes after drinking coffee -no CP, SOB currently, plans to follow up with PCP about this - but wonders if could be GERD  ROS: See pertinent positives and negatives per HPI.  Past Medical History:  Diagnosis Date  . ABDOMINAL PAIN OTHER SPECIFIED SITE 04/30/2008  . ALLERGIC RHINITIS 08/25/2007  . Anxiety 06/28/2010  . BENIGN PROSTATIC HYPERTROPHY 08/21/2006  . Chest pain 06/28/2010  . COLONIC POLYPS, HX OF 04/18/2007  . Complication of anesthesia    post op nausea and vomiting and wakes up too early per pt  . 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 Diverticulosis of colon (without mention of hemorrhage) 04/18/2007  . EXTERNAL HEMORRHOIDS 04/18/2007  . Headache(784.0) 09/20/2009   hx migraines  . HEARING LOSS, 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  .  HYPERLIPIDEMIA 08/21/2006  . INSOMNIA-SLEEP DISORDER-UNSPEC 09/20/2009  . NONSPECIFIC ABN FINDING RAD & OTH EXAM GU ORGAN 05/03/2008  . PERIPHERAL VASCULAR DISEASE 08/25/2007  . PONV (postoperative nausea and vomiting)   . Raynaud's syndrome 08/21/2006    Past Surgical History:  Procedure Laterality Date  . APPENDECTOMY    . COLONOSCOPY  07/25/2006  . s/p knee surgery Bilateral    x's 4 since 1980  . s/p left shoulder  2002   rotator cuff  . s/p lumbar disc surgery  2007  . 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 TOTAL KNEE ARTHROPLASTY Right 03/26/2016   Procedure: RIGHT TOTAL KNEE ARTHROPLASTY;  Surgeon: Vickey Huger, MD;  Location: [facility];  Service: Orthopedics;  Laterality: Right;  . 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  aspirin 81 MG EC tablet, Take 1 tablet (81 mg total) by mouth daily. Swallow whole., Disp: 30 tablet, Rfl: 12 .  atorvastatin (LIPITOR) 10 MG tablet, Take 1 tablet (10 mg total) by mouth daily., Disp: 90 tablet, Rfl: 3 .  benzonatate (TESSALON PERLES) 100 MG capsule, Take 1 capsule (100 mg total) by mouth 3 (three) times daily as needed., Disp: 20 capsule, Rfl: 0 .  diphenhydrAMINE (BENADRYL) 50 MG tablet, Take 1 tablet by mouth 1 hour prior to Cardiac CT, Disp: 1 tablet, Rfl: 0 .  ketoconazole (NIZORAL) 2 % cream, Apply 1 application topically daily., Disp: 30 g, Rfl: 2 .  meloxicam (MOBIC) 15 MG tablet, TAKE 1 TABLET BY MOUTH EVERY DAY, Disp: 90 tablet, Rfl: 2 .  metoprolol tartrate (LOPRESSOR) 25 MG tablet, Take 1 tablet by mouth 2 hours prior to Cardiac CT, Disp: 1 tablet, Rfl: 0 .  nitroGLYCERIN (NITROSTAT) 0.4 MG SL tablet, Place 1 tablet (0.4 mg total) under the tongue every 5 (five) minutes as needed for chest pain., Disp: 25 tablet, Rfl: 3 .  predniSONE (DELTASONE) 50 MG tablet, Take 1 tablet by  mouth 13 hours, 7 hours, and 1 hour prior to Cardiac CT, Disp: 3 tablet, Rfl: 0 .  traMADol (ULTRAM) 50 MG tablet, Take 1 tablet (50 mg total) by mouth every 12 (twelve) hours as needed., Disp: 180 tablet, Rfl: 0 .  triamcinolone (NASACORT) 55 MCG/ACT AERO nasal inhaler, Place 2 sprays into the nose daily., Disp: 1 Inhaler, Rfl: 12 .  zolpidem (AMBIEN) 10 MG tablet, TAKE 1 TABLET BY MOUTH EVERYDAY AT BEDTIME, Disp: 90 tablet, Rfl: 1  EXAM:  VITALS per patient if applicable:  GENERAL: alert, oriented, appears well and in no acute distress  HEENT: atraumatic, conjunttiva clear, no obvious abnormalities on inspection of external nose and ears  NECK: normal movements of the head and neck  LUNGS: on inspection no signs of respiratory distress, breathing rate appears normal, no obvious gross SOB, gasping or wheezing  CV: no obvious cyanosis  MS: moves all visible extremities without noticeable abnormality  PSYCH/NEURO: pleasant and cooperative, no obvious depression or anxiety, speech and thought processing grossly intact  ASSESSMENT AND PLAN:  Discussed the following assessment and plan:  Nasal congestion  Cough  Chest discomfort  -we discussed possible serious and likely etiologies, options for evaluation and workup, limitations of telemedicine visit vs in person visit, treatment, treatment risks and precautions. Pt prefers to treat via telemedicine empirically rather than in person at this moment.  For the acute upper respiratory symptoms, suspect allergic rhinitis versus viral upper respiratory illness versus other.  He did travel recently and was around a lot of people.  Opted for nasal saline, Tessalon for cough, over-the-counter symptomatic care.  He also agrees to do Covid test, though this is less likely given fully vaccinated and had his booster.  He is not having any fever or body aches to suggest influenza.  Advised prompt in-person evaluation or follow-up if worsening or not  improving over the next several days. In terms of the chronic chest discomfort, it is reasonable to consider GERD.  Opted for trial of over-the-counter low-dose PPI and advised follow-up with PCP in case this does not resolve the issue and to ensure is doing better.  He is on an NSAID chronically. Scheduled follow up with PCP offered: He agrees to call PCP to schedule follow-up regarding the chronic chest discomfort. Advised to seek prompt in person care if worsening, new symptoms arise, or if is not improving with treatment. Discussed options for inperson care if PCP office not available. Did let this patient know that I only do telemedicine on Tuesdays and Thursdays for [facility]. Advised to schedule follow up visit with PCP or [facility] if any further questions or concerns to avoid delays in care.   I discussed the assessment and treatment plan with the patient. The patient was provided an opportunity to ask questions and all were answered. The patient agreed with the plan and demonstrated an understanding of the instructions.     Lucretia Kern, DO

## 2019-11-10 NOTE — Patient Instructions (Signed)
-  I sent the medication(s) we discussed to your pharmacy: Meds ordered this encounter  Medications  . benzonatate (TESSALON PERLES) 100 MG capsule    Sig: Take 1 capsule (100 mg total) by mouth 3 (three) times daily as needed.    Dispense:  20 capsule    Refill:  0   Can use nasal saline twice daily  Can try an antihistamine such as Allegra or Zyrtec once daily see if this helps with the nasal congestion and drainage.  Get a Covid test.  Try an over-the-counter acid reducer such as Prilosec or Nexium.  Also can try diet changes below to see if this helps with the chronic chest discomfort.  Follow-up with your doctor about this this in the next few weeks.  Follow-up sooner if any worsening or new symptoms arise.  I hope you are feeling better soon!  Seek in person care promptly if your symptoms worsen, new concerns arise or you are not improving with treatment.  It was nice to meet you today. I help [facility] out with telemedicine visits on Tuesdays and Thursdays and am available for visits on those days. If you have any concerns or questions following this visit please schedule a follow up visit with your Primary Care doctor or seek care at a local urgent care clinic to avoid delays in care.

## 2019-11-13 ENCOUNTER — Encounter: Payer: Self-pay | Admitting: Internal Medicine

## 2019-11-16 ENCOUNTER — Encounter: Payer: Self-pay | Admitting: Internal Medicine

## 2019-11-17 ENCOUNTER — Ambulatory Visit: Payer: Medicare HMO | Admitting: Internal Medicine

## 2019-11-20 DIAGNOSIS — M19011 Primary osteoarthritis, right shoulder: Secondary | ICD-10-CM | POA: Diagnosis not present

## 2019-11-20 DIAGNOSIS — M19012 Primary osteoarthritis, left shoulder: Secondary | ICD-10-CM | POA: Diagnosis not present

## 2019-11-20 DIAGNOSIS — G5603 Carpal tunnel syndrome, bilateral upper limbs: Secondary | ICD-10-CM | POA: Diagnosis not present

## 2019-11-23 ENCOUNTER — Other Ambulatory Visit: Payer: Self-pay | Admitting: Internal Medicine

## 2019-11-23 ENCOUNTER — Encounter: Payer: Self-pay | Admitting: Internal Medicine

## 2019-11-23 ENCOUNTER — Other Ambulatory Visit: Payer: Self-pay

## 2019-11-23 ENCOUNTER — Ambulatory Visit (INDEPENDENT_AMBULATORY_CARE_PROVIDER_SITE_OTHER): Payer: Medicare HMO | Admitting: Internal Medicine

## 2019-11-23 DIAGNOSIS — R079 Chest pain, unspecified: Secondary | ICD-10-CM | POA: Diagnosis not present

## 2019-11-23 DIAGNOSIS — E785 Hyperlipidemia, unspecified: Secondary | ICD-10-CM

## 2019-11-23 DIAGNOSIS — K219 Gastro-esophageal reflux disease without esophagitis: Secondary | ICD-10-CM | POA: Diagnosis not present

## 2019-11-23 DIAGNOSIS — R739 Hyperglycemia, unspecified: Secondary | ICD-10-CM | POA: Diagnosis not present

## 2019-11-23 MED ORDER — PANTOPRAZOLE SODIUM 40 MG PO TBEC: 40.0000 mg | DELAYED_RELEASE_TABLET | Freq: Every day | ORAL | 3 refills | Status: DC

## 2019-11-23 NOTE — Patient Instructions (Addendum)
Please take all new medication as prescribed - the protonix  You can also take TUMS as needed for breakthrough discomfort  Please continue to follow up with your heart testing soon  Please continue all other medications as before, and refills have been done if requested.  Please have the pharmacy call with any other refills you may need.  Please continue your efforts at being more active, low cholesterol diet, and weight control.  Please keep your appointments with your specialists as you may have planned

## 2019-11-23 NOTE — Progress Notes (Signed)
Subjective:    Patient ID: Jason Robles, male    DOB: [birth year], 74 y.o.   MRN: [id number]  HPI  Here to f/u with 6 wks recurring lower SSCP burning dull mild to mod, intermittent, worse with eating certain food, has not tried any OTC meds that are not recommended here, nothing else seems to make better or worse.  Pt denies other chest pain, increased sob or doe, wheezing, orthopnea, PND, increased LE swelling, palpitations, dizziness or syncope.  Pt denies new neurological symptoms such as new headache, or facial or extremity weakness or numbness   Pt denies polydipsia, polyuria  Pt denies fever, wt loss, night sweats, loss of appetite, or other constitutional symptoms Past Medical History:  Diagnosis Date  . ABDOMINAL PAIN OTHER SPECIFIED SITE 04/30/2008  . ALLERGIC RHINITIS 08/25/2007  . Anxiety 06/28/2010  . BENIGN PROSTATIC HYPERTROPHY 08/21/2006  . Chest pain 06/28/2010  . COLONIC POLYPS, HX OF 04/18/2007  . Complication of anesthesia    post op nausea and vomiting and wakes up too early per pt  . 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 Diverticulosis of colon (without mention of hemorrhage) 04/18/2007  . EXTERNAL HEMORRHOIDS 04/18/2007  . Headache(784.0) 09/20/2009   hx migraines  . HEARING LOSS, 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  . HYPERLIPIDEMIA 08/21/2006  . INSOMNIA-SLEEP DISORDER-UNSPEC 09/20/2009  . NONSPECIFIC ABN FINDING RAD & OTH EXAM GU ORGAN 05/03/2008  . PERIPHERAL VASCULAR DISEASE 08/25/2007  . PONV (postoperative nausea and vomiting)   . Raynaud's syndrome 08/21/2006   Past Surgical History:  Procedure Laterality Date  . APPENDECTOMY    . COLONOSCOPY  07/25/2006  . s/p knee surgery Bilateral    x's 4 since 1980  . s/p left shoulder  2002   rotator cuff  . s/p lumbar disc surgery  2007  . 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 TOTAL KNEE ARTHROPLASTY Right 03/26/2016   Procedure: RIGHT TOTAL KNEE ARTHROPLASTY;  Surgeon: Vickey Huger, MD;  Location: [facility];   Service: Orthopedics;  Laterality: Right;  . 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 His smoking use included cigarettes. He has a 15.00 pack-year smoking history. He has never used smokeless tobacco. He reports current alcohol use of about 4.0 standard drinks of alcohol per week. He reports that he does not use drugs. family history includes Alzheimer's disease in his mother; Diabetes in his maternal uncle and mother; Kidney cancer in his brother. Allergies  Allergen Reactions  . Iohexol Itching    Itching post 125cc Omni injection. Treated w/ 50 mg PO benedryl.  Onset Date: [id number]    Current Outpatient Medications on File Prior to Visit  Medication Sig Dispense Refill  . aspirin 81 MG EC tablet Take 1 tablet (81 mg total) by mouth daily. Swallow whole. 30 tablet 12  . ketoconazole (NIZORAL) 2 % cream Apply 1 application topically daily. 30 g 2  . meloxicam (MOBIC) 15 MG tablet TAKE 1 TABLET BY MOUTH EVERY DAY 90 tablet 2  . zolpidem (AMBIEN) 10 MG tablet TAKE 1 TABLET BY MOUTH EVERYDAY AT BEDTIME 90 tablet 1  . benzonatate (TESSALON PERLES) 100 MG capsule Take 1 capsule (100 mg total) by mouth 3 (three) times daily as needed. (Patient not taking: Reported on 11/23/2019) 20 capsule 0  . diphenhydrAMINE (BENADRYL) 50 MG tablet  Take 1 tablet by mouth 1 hour prior to Cardiac CT (Patient not taking: Reported on 11/23/2019) 1 tablet 0  . metoprolol tartrate (LOPRESSOR) 25 MG tablet Take 1 tablet by mouth 2 hours prior to Cardiac CT (Patient not taking: Reported on 11/23/2019) 1 tablet 0  . nitroGLYCERIN (NITROSTAT) 0.4 MG SL tablet Place 1 tablet (0.4 mg total) under the tongue every 5 (five) minutes as needed for chest pain. (Patient not taking: Reported on 11/23/2019) 25 tablet 3  . predniSONE (DELTASONE) 50 MG tablet  Take 1 tablet by mouth 13 hours, 7 hours, and 1 hour prior to Cardiac CT (Patient not taking: Reported on 11/23/2019) 3 tablet 0  . traMADol (ULTRAM) 50 MG tablet Take 1 tablet (50 mg total) by mouth every 12 (twelve) hours as needed. (Patient not taking: Reported on 11/23/2019) 180 tablet 0  . triamcinolone (NASACORT) 55 MCG/ACT AERO nasal inhaler Place 2 sprays into the nose daily. (Patient not taking: Reported on 11/23/2019) 1 Inhaler 12   No current facility-administered medications on file prior to visit.   Review of Systems All otherwise neg per pt    Objective:   Physical Exam BP 138/66 (BP Location: Left Arm, Patient Position: Sitting, Cuff Size: Normal)   Pulse 66   Temp 98.2 F (36.8 C) (Oral)   Ht 5\' 11"  (1.803 m)   Wt 181 lb (82.1 kg)   SpO2 97%   BMI 25.24 kg/m  VS noted,  Constitutional: Pt appears in NAD HENT: Head: NCAT.  Right Ear: External ear normal.  Left Ear: External ear normal.  Eyes: . Pupils are equal, round, and reactive to light. Conjunctivae and EOM are normal Nose: without d/c or deformity Neck: Neck supple. Gross normal ROM Cardiovascular: Normal rate and regular rhythm.   Pulmonary/Chest: Effort normal and breath sounds without rales or wheezing.  Abd:  Soft, NT, ND, + BS, no organomegaly Neurological: Pt is alert. At baseline orientation, motor grossly intact Skin: Skin is warm. No rashes, other new lesions, no LE edema Psychiatric: Pt behavior is normal without agitation  All otherwise neg per pt Lab Results  Component Value Date   WBC 7.3 08/05/2019   HGB 15.3 08/05/2019   HCT 45.3 08/05/2019   PLT 185 08/05/2019   GLUCOSE 83 10/22/2019   CHOL 181 08/05/2019   TRIG 95 08/05/2019   HDL 72 08/05/2019   LDLDIRECT 100.0 07/14/2018   LDLCALC 90 08/05/2019   ALT 14 08/05/2019   AST 15 08/05/2019   NA 142 10/22/2019   K 5.0 10/22/2019   CL 103 10/22/2019   CREATININE 0.92 10/22/2019   BUN 19 10/22/2019   CO2 29 10/22/2019   TSH 1.00  08/05/2019   PSA 2.4 08/05/2019   INR 0.89 05/02/2012   HGBA1C 5.8 (H) 08/05/2019          Assessment & Plan:

## 2019-11-23 NOTE — Telephone Encounter (Signed)
Please refill as per office routine med refill policy (all routine meds refilled for 3 mo or monthly per pt preference up to one year from last visit, then month to month grace period for 3 mo, then further med refills will have to be denied)  

## 2019-11-29 ENCOUNTER — Encounter: Payer: Self-pay | Admitting: Internal Medicine

## 2019-11-29 NOTE — Assessment & Plan Note (Signed)
stable overall by history and exam, recent data reviewed with pt, and pt to continue medical treatment as before,  to f/u any worsening symptoms or concerns  

## 2019-11-29 NOTE — Assessment & Plan Note (Addendum)
Atypical, declines ecg, low suspicion for cardiac, likely reflux, for protonix 40 qd,  to f/u any worsening symptoms or concerns  I spent 31 minutes in preparing to see the patient by review of recent labs, imaging and procedures, obtaining and reviewing separately obtained history, communicating with the patient and family or caregiver, ordering medications, tests or procedures, and documenting clinical information in the EHR including the differential Dx, treatment, and any further evaluation and other management of cp, hyperglycemia, hld

## 2019-11-29 NOTE — Assessment & Plan Note (Signed)
Lab Results  Component Value Date   LDLCALC 90 08/05/2019  stable overall by history and exam, recent data reviewed with pt, and pt to continue medical treatment as before,  to f/u any worsening symptoms or concerns

## 2020-01-15 ENCOUNTER — Telehealth: Payer: Self-pay

## 2020-01-15 NOTE — Telephone Encounter (Signed)
Key: [id number]

## 2020-01-18 MED ORDER — ESZOPICLONE 2 MG PO TABS: 2.0000 mg | ORAL_TABLET | Freq: Every evening | ORAL | 1 refills | Status: DC | PRN

## 2020-01-18 NOTE — Telephone Encounter (Signed)
Ok to try change to lunesta 2 mg as this is somewhat similar, and seems to preferred on the EMR for his insurance   Pt has been on ambien for quite some time; please inform pt about the change that should work just as well,   The only thing to really watch for is a metal taste in the mouth

## 2020-01-18 NOTE — Addendum Note (Signed)
Addended by: Biagio Borg on: 01/18/2020 06:13 PM   Modules accepted: Orders

## 2020-01-18 NOTE — Telephone Encounter (Signed)
PA Denied - "We denied this request under Medicare Part D because: The information provided by your prescriber did not  meet the requirements for covering this medication (prior authorization)." Doxepin 3 or 6mg  suggested (b/c it is a non high medication)

## 2020-01-19 NOTE — Telephone Encounter (Signed)
LM to notify patient that Dr. Jenny Reichmann has called in [place] as alternative to Ambien; cautioned about metallic taste. He is encouraged to call back with further questions or concerns.

## 2020-01-20 ENCOUNTER — Telehealth: Payer: Self-pay

## 2020-01-20 NOTE — Telephone Encounter (Signed)
Prior Auth completed for Eszopiclone

## 2020-01-29 DIAGNOSIS — M19012 Primary osteoarthritis, left shoulder: Secondary | ICD-10-CM | POA: Diagnosis not present

## 2020-01-29 DIAGNOSIS — M25511 Pain in right shoulder: Secondary | ICD-10-CM | POA: Diagnosis not present

## 2020-01-29 DIAGNOSIS — G5603 Carpal tunnel syndrome, bilateral upper limbs: Secondary | ICD-10-CM | POA: Diagnosis not present

## 2020-01-29 DIAGNOSIS — M19011 Primary osteoarthritis, right shoulder: Secondary | ICD-10-CM | POA: Diagnosis not present

## 2020-03-17 ENCOUNTER — Telehealth: Payer: Self-pay | Admitting: *Deleted

## 2020-03-17 DIAGNOSIS — Z006 Encounter for examination for normal comparison and control in clinical research program: Secondary | ICD-10-CM

## 2020-03-17 NOTE — Telephone Encounter (Signed)
I called patient for 90 day Identify Study phone call. Patient is doing well and has no cardiac symptoms. I reminded patient I would call him in Nov. for 1 year phone call.

## 2020-03-28 DIAGNOSIS — M7542 Impingement syndrome of left shoulder: Secondary | ICD-10-CM | POA: Diagnosis not present

## 2020-03-28 DIAGNOSIS — M19012 Primary osteoarthritis, left shoulder: Secondary | ICD-10-CM | POA: Diagnosis not present

## 2020-03-28 DIAGNOSIS — M19011 Primary osteoarthritis, right shoulder: Secondary | ICD-10-CM | POA: Diagnosis not present

## 2020-03-28 DIAGNOSIS — M7541 Impingement syndrome of right shoulder: Secondary | ICD-10-CM | POA: Diagnosis not present

## 2020-04-15 ENCOUNTER — Other Ambulatory Visit: Payer: Self-pay | Admitting: Internal Medicine

## 2020-04-27 NOTE — Telephone Encounter (Signed)
Patient calling, states he got something in the mail from [place] saying it has been denied and we need to do it again

## 2020-05-11 ENCOUNTER — Telehealth: Payer: Self-pay

## 2020-05-11 NOTE — Telephone Encounter (Signed)
Called patient and he states that he is with a customer and will call back.

## 2020-05-11 NOTE — Telephone Encounter (Signed)
Patient called again in regards to Eszopiclone. He is requesting a call back at [phone]. Please advise

## 2020-05-11 NOTE — Telephone Encounter (Signed)
° ° °  Please return call to patient °

## 2020-05-12 NOTE — Telephone Encounter (Signed)
Returned patient call. Patient was wondering what was going on with the medication Eszopiclone. I have let him know we are waiting on the PA to be processed.

## 2020-05-19 ENCOUNTER — Telehealth: Payer: Self-pay

## 2020-05-19 NOTE — Telephone Encounter (Signed)
PA KEY: [id number]   Eszopiclone 2MG  tablets   No eligibility was found for pt insurance.

## 2020-05-20 ENCOUNTER — Telehealth: Payer: Self-pay | Admitting: Internal Medicine

## 2020-05-20 NOTE — Telephone Encounter (Signed)
Needs OV.  

## 2020-05-20 NOTE — Telephone Encounter (Signed)
   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 Patient is using peroxide and Neosporin but feels he needs something else prescribed  Please call patient

## 2020-05-24 ENCOUNTER — Encounter: Payer: Self-pay | Admitting: Internal Medicine

## 2020-05-24 ENCOUNTER — Other Ambulatory Visit: Payer: Self-pay

## 2020-05-24 ENCOUNTER — Ambulatory Visit (INDEPENDENT_AMBULATORY_CARE_PROVIDER_SITE_OTHER): Payer: Medicare HMO | Admitting: Internal Medicine

## 2020-05-24 DIAGNOSIS — L089 Local infection of the skin and subcutaneous tissue, unspecified: Secondary | ICD-10-CM

## 2020-05-24 DIAGNOSIS — L237 Allergic contact dermatitis due to plants, except food: Secondary | ICD-10-CM | POA: Diagnosis not present

## 2020-05-24 HISTORY — DX: Allergic contact dermatitis due to plants, except food: L23.7

## 2020-05-24 MED ORDER — CEPHALEXIN 500 MG PO CAPS: 500.0000 mg | ORAL_CAPSULE | Freq: Two times a day (BID) | ORAL | 0 refills | Status: AC

## 2020-05-24 MED ORDER — TRIAMCINOLONE ACETONIDE 0.1 % EX CREA: 1.0000 "application " | TOPICAL_CREAM | Freq: Two times a day (BID) | CUTANEOUS | 0 refills | Status: DC

## 2020-05-24 NOTE — Patient Instructions (Addendum)
We have sent in triamcinolone ointment to use on the rashes twice a day until gone.  We have sent in keflex to take 1 pill twice a day for 5 days.   Your tetanus shot is due in September and with your work I would make sure to get this then to keep it up to date.

## 2020-05-24 NOTE — Assessment & Plan Note (Signed)
Rx keflex. Tetanus up to date with job have recommended he have repeat Sept 2022 to keep this up to date.

## 2020-05-24 NOTE — Assessment & Plan Note (Signed)
Rx triamcinolone ointment.  

## 2020-05-24 NOTE — Progress Notes (Signed)
   Subjective:   Patient ID: Jason Robles, male    DOB: Jan 27, 1945, 75 y.o.   MRN: [id number]  HPI The patient is a 75 YO man coming in for scratch to right wrist about 2 weeks ago, itching some and using hydrocortisone. It was a puncture wound from frames It is swelling. Denies fevers or chills. Denies pain. Also has a separate new rash after spending all day out in the woods this weekend.   Review of Systems  Constitutional: Negative.   HENT: Negative.   Eyes: Negative.   Respiratory: Negative for cough, chest tightness and shortness of breath.   Cardiovascular: Negative for chest pain, palpitations and leg swelling.  Gastrointestinal: Negative for abdominal distention, abdominal pain, constipation, diarrhea, nausea and vomiting.  Musculoskeletal: Negative.   Skin: Positive for rash and wound.  Neurological: Negative.   Psychiatric/Behavioral: Negative.     Objective:  Physical Exam Constitutional:      Appearance: He is well-developed.  HENT:     Head: Normocephalic and atraumatic.  Cardiovascular:     Rate and Rhythm: Normal rate and regular rhythm.  Pulmonary:     Effort: Pulmonary effort is normal. No respiratory distress.     Breath sounds: Normal breath sounds. No wheezing or rales.  Abdominal:     General: Bowel sounds are normal. There is no distension.     Palpations: Abdomen is soft.     Tenderness: There is no abdominal tenderness. There is no rebound.  Musculoskeletal:     Cervical back: Normal range of motion.  Skin:    General: Skin is warm and dry.     Comments: Wound on the right wrist which is covered with scab redness and swelling surrounding, no pain to palpation, some scattered rash consistent with poison oak on the legs and arms  Neurological:     Mental Status: He is alert and oriented to person, place, and time.     Coordination: Coordination normal.     Vitals:   05/24/20 0833  BP: 122/80  Pulse: 63  Resp: 18  Temp: 98.1 F (36.7 C)   TempSrc: Oral  SpO2: 98%  Weight: 177 lb 3.2 oz (80.4 kg)  Height: 5\' 11"  (1.803 m)    This visit occurred during the SARS-CoV-2 public health emergency.  Safety protocols were in place, including screening questions prior to the visit, additional usage of staff PPE, and extensive cleaning of exam room while observing appropriate contact time as indicated for disinfecting solutions.   Assessment & Plan:

## 2020-05-27 ENCOUNTER — Other Ambulatory Visit: Payer: Self-pay

## 2020-05-27 ENCOUNTER — Ambulatory Visit (INDEPENDENT_AMBULATORY_CARE_PROVIDER_SITE_OTHER): Payer: Medicare HMO | Admitting: Internal Medicine

## 2020-05-27 ENCOUNTER — Encounter: Payer: Self-pay | Admitting: Internal Medicine

## 2020-05-27 VITALS — BP 148/70 | HR 61 | Temp 98.7°F | Ht 71.0 in | Wt 176.0 lb

## 2020-05-27 DIAGNOSIS — R739 Hyperglycemia, unspecified: Secondary | ICD-10-CM

## 2020-05-27 DIAGNOSIS — R21 Rash and other nonspecific skin eruption: Secondary | ICD-10-CM | POA: Diagnosis not present

## 2020-05-27 DIAGNOSIS — L089 Local infection of the skin and subcutaneous tissue, unspecified: Secondary | ICD-10-CM

## 2020-05-27 MED ORDER — PREDNISONE 10 MG PO TABS: ORAL_TABLET | ORAL | 0 refills | Status: DC

## 2020-05-27 MED ORDER — METHYLPREDNISOLONE ACETATE 80 MG/ML IJ SUSP
80.0000 mg | Freq: Once | INTRAMUSCULAR | Status: AC
  Administered 2020-05-27: 80 mg via INTRAMUSCULAR

## 2020-05-27 NOTE — Patient Instructions (Signed)
You had the steroid shot today - the depomedrol  Please take all new medication as prescribed - the prednisone  Please continue all other medications as before, and refills have been done if requested.  Please have the pharmacy call with any other refills you may need.  Please keep your appointments with your specialists as you may have planned  Please go to the LAB at the blood drawing area for the tests to be done  You will be contacted by phone if any changes need to be made immediately.  Otherwise, you will receive a letter about your results with an explanation, but please check with MyChart first.  Please remember to sign up for MyChart if you have not done so, as this will be important to you in the future with finding out test results, communicating by private email, and scheduling acute appointments online when needed.

## 2020-05-27 NOTE — Progress Notes (Signed)
Patient ID: Jason Robles, male   DOB: [birth year], 75 y.o.   MRN: [id number]        Chief Complaint: right wrist infection and other rash       HPI:  Jason Robles is a 75 y.o. 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  Also incidentally has other itchy rash seen and tx for both per Dr Sharlet Salina may 24, tx with cephalexin course.  Right wrist area redness and scratchings seems to have much improved with only very mild residual erythema now and no worsening redness, tender, swelling, abscess, red streaks or drainage. Unfortunately his other incidental itchy rash has persisted no better with topical steroid cream, and may be somewhat worse, though not more in area of involvement, just seems itchy and worse to him.  Areas involed include 4 x 4 cm right buttock,6 x 4 cm area right pec major area, and much smaller but significant spots and  "lines" of involvement to the right shin and other arms and left leg.  Pt states no obvious contact irritants but has been spending time outdoors with better weather.  No fever, chills, Pt denies chest pain, increased sob or doe, wheezing, orthopnea, PND, increased LE swelling, palpitations, dizziness or syncope.   Pt denies polydipsia, polyuria       Wt Readings from Last 3 Encounters:  05/27/20 176 lb (79.8 kg)  05/24/20 177 lb 3.2 oz (80.4 kg)  11/23/19 181 lb (82.1 kg)   BP Readings from Last 3 Encounters:  05/27/20 (!) 148/70  05/24/20 122/80  11/23/19 138/66         Past Medical History:  Diagnosis Date  . ABDOMINAL PAIN OTHER SPECIFIED SITE 04/30/2008  . ALLERGIC RHINITIS 08/25/2007  . Anxiety 06/28/2010  . BENIGN PROSTATIC HYPERTROPHY 08/21/2006  . Chest pain 06/28/2010  . COLONIC POLYPS, HX OF 04/18/2007  . Complication of anesthesia    post op nausea and vomiting and wakes up too early per pt  . DEGENERATIVE JOINT DISEASE, KNEES,  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  . Diverticulosis of colon (without mention of hemorrhage) 04/18/2007  . EXTERNAL HEMORRHOIDS 04/18/2007  . Headache(784.0) 09/20/2009   hx migraines  . HEARING LOSS, 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  . HYPERLIPIDEMIA 08/21/2006  . INSOMNIA-SLEEP DISORDER-UNSPEC 09/20/2009  . NONSPECIFIC ABN FINDING RAD & OTH EXAM GU ORGAN 05/03/2008  . PERIPHERAL VASCULAR DISEASE 08/25/2007  . PONV (postoperative nausea and vomiting)   . Raynaud's syndrome 08/21/2006   Past Surgical History:  Procedure Laterality Date  . APPENDECTOMY    . COLONOSCOPY  07/25/2006  . s/p knee surgery Bilateral    x's 4 since 1980  . s/p left shoulder  2002   rotator cuff  . s/p lumbar disc surgery  2007  . 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 TOTAL KNEE ARTHROPLASTY Right 03/26/2016   Procedure: RIGHT TOTAL KNEE ARTHROPLASTY;  Surgeon: Vickey Huger, MD;  Location: [facility];  Service: Orthopedics;  Laterality: Right;  . 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 His smoking use included cigarettes. He has a 15.00 pack-year smoking history. He has never used smokeless tobacco. He reports current alcohol use of about 4.0 standard drinks of alcohol per week. He reports that he  does not use drugs. family history includes Alzheimer's disease in his mother; Diabetes in his maternal uncle and mother; Kidney cancer in his brother. Allergies  Allergen Reactions  . Iohexol Itching    Itching post 125cc Omni injection. Treated w/ 50 mg PO benedryl.  Onset Date: [id number]    Current Outpatient Medications on File Prior to Visit  Medication Sig Dispense Refill  . aspirin 81 MG EC tablet Take 1 tablet (81 mg total) by mouth daily. Swallow whole. 30 tablet 12  . atorvastatin (LIPITOR) 10 MG tablet TAKE 1 TABLET BY MOUTH EVERY DAY 90 tablet 3  . cephALEXin  (KEFLEX) 500 MG capsule Take 1 capsule (500 mg total) by mouth 2 (two) times daily for 5 days. 10 capsule 0  . ketoconazole (NIZORAL) 2 % cream Apply 1 application topically daily. 30 g 2  . meloxicam (MOBIC) 15 MG tablet TAKE 1 TABLET BY MOUTH EVERY DAY 90 tablet 2  . pantoprazole (PROTONIX) 40 MG tablet Take 1 tablet (40 mg total) by mouth daily. 90 tablet 3  . triamcinolone cream (KENALOG) 0.1 % Apply 1 application topically 2 (two) times daily. 100 g 0  . diphenhydrAMINE (BENADRYL) 50 MG tablet Take 1 tablet by mouth 1 hour prior to Cardiac CT (Patient not taking: No sig reported) 1 tablet 0  . eszopiclone (LUNESTA) 2 MG TABS tablet Take 1 tablet (2 mg total) by mouth at bedtime as needed for sleep. Take immediately before bedtime (Patient not taking: No sig reported) 90 tablet 1  . metoprolol tartrate (LOPRESSOR) 25 MG tablet Take 1 tablet by mouth 2 hours prior to Cardiac CT (Patient not taking: No sig reported) 1 tablet 0  . nitroGLYCERIN (NITROSTAT) 0.4 MG SL tablet Place 1 tablet (0.4 mg total) under the tongue every 5 (five) minutes as needed for chest pain. (Patient not taking: No sig reported) 25 tablet 3  . traMADol (ULTRAM) 50 MG tablet Take 1 tablet (50 mg total) by mouth every 12 (twelve) hours as needed. (Patient not taking: No sig reported) 180 tablet 0  . triamcinolone (NASACORT) 55 MCG/ACT AERO nasal inhaler Place 2 sprays into the nose daily. (Patient not taking: No sig reported) 1 Inhaler 12   No current facility-administered medications on file prior to visit.        ROS:  All others reviewed and negative.  Objective        PE:  BP (!) 148/70 (BP Location: Right Arm, Patient Position: Sitting, Cuff Size: Normal)   Pulse 61   Temp 98.7 F (37.1 C) (Oral)   Ht 5\' 11"  (1.803 m)   Wt 176 lb (79.8 kg)   SpO2 93%   BMI 24.55 kg/m                 Constitutional: Pt appears in NAD               HENT: Head: NCAT.                Right Ear: External ear normal.                  Left Ear: External ear normal.                Eyes: . Pupils are equal, round, and reactive to light. Conjunctivae and EOM are normal               Nose: without d/c or deformity  Neck: Neck supple. Gross normal ROM               Cardiovascular: Normal rate and regular rhythm.                 Pulmonary/Chest: Effort normal and breath sounds without rales or wheezing.                Abd:  Soft, NT, ND, + BS, no organomegaly               Neurological: Pt is alert. At baseline orientation, motor grossly intact               Skin: right wrist  2x2 cm area nontender nonswelling scratchings improving; also has other more area involved right chest, right buttock, right shin and "spots" to the arms and left leg anterior shin as well nontender and purplish somewhat suggestive of echymosis               Psychiatric: Pt behavior is normal without agitation   Micro: none  Cardiac tracings I have personally interpreted today:  none  Pertinent Radiological findings (summarize): none   Lab Results  Component Value Date   WBC 5.6 05/27/2020   HGB 14.1 05/27/2020   HCT 42.8 05/27/2020   PLT 167 05/27/2020   GLUCOSE 96 05/27/2020   CHOL 181 08/05/2019   TRIG 95 08/05/2019   HDL 72 08/05/2019   LDLDIRECT 100.0 07/14/2018   LDLCALC 90 08/05/2019   ALT 13 05/27/2020   AST 19 05/27/2020   NA 138 05/27/2020   K 4.2 05/27/2020   CL 100 05/27/2020   CREATININE 0.94 05/27/2020   BUN 22 05/27/2020   CO2 27 05/27/2020   TSH 1.00 08/05/2019   PSA 2.4 08/05/2019   INR 0.89 05/02/2012   HGBA1C 5.8 (H) 08/05/2019   Assessment/Plan:  Jason Robles is a 75 y.o. Other or two or more races [6] male with  has a past medical history of ABDOMINAL PAIN OTHER SPECIFIED SITE (04/30/2008), ALLERGIC RHINITIS (08/25/2007), Anxiety (06/28/2010), BENIGN PROSTATIC HYPERTROPHY (08/21/2006), Chest pain (06/28/2010), COLONIC POLYPS, HX OF (03/27/7122), Complication of anesthesia, DEGENERATIVE JOINT DISEASE,  KNEES, BILATERAL (08/21/2006), Diverticulosis of colon (without mention of hemorrhage) (04/18/2007), EXTERNAL HEMORRHOIDS (04/18/2007), Headache(784.0) (09/20/2009), HEARING LOSS, RIGHT EAR (08/21/2006), HYPERLIPIDEMIA (08/21/2006), INSOMNIA-SLEEP DISORDER-UNSPEC (09/20/2009), NONSPECIFIC ABN FINDING RAD & OTH EXAM GU ORGAN (05/03/2008), PERIPHERAL VASCULAR DISEASE (08/25/2007), PONV (postoperative nausea and vomiting), and Raynaud's syndrome (08/21/2006).  Skin infection Right wrist improving, no further antibx tx needed  Rash Other rash uncertain etiology, most likely allergic possibly contact related though I cant explain the large 4x4 cm confluent erythema nontender area right buttock, and due to the purplish character of some lesions to the left arm and leg we will check cbc and labs, o/w ok to continue topical steroid, but also depomedrol im 80, and predpac asd  to f/u any worsening symptoms or concerns  Hyperglycemia Lab Results  Component Value Date   HGBA1C 5.8 (H) 08/05/2019   Stable, pt to continue current medical treatment  - diet   Followup: Return if symptoms worsen or fail to improve.  Cathlean Cower, MD 05/28/2020 3:39 PM [facility]

## 2020-05-28 ENCOUNTER — Encounter: Payer: Self-pay | Admitting: Internal Medicine

## 2020-05-28 DIAGNOSIS — R21 Rash and other nonspecific skin eruption: Secondary | ICD-10-CM | POA: Insufficient documentation

## 2020-05-28 LAB — BASIC METABOLIC PANEL
BUN: 22 mg/dL (ref 7–25)
CO2: 27 mmol/L (ref 20–32)
Calcium: 9.7 mg/dL (ref 8.6–10.3)
Chloride: 100 mmol/L (ref 98–110)
Creat: 0.94 mg/dL (ref 0.70–1.18)
Glucose, Bld: 96 mg/dL (ref 65–99)
Potassium: 4.2 mmol/L (ref 3.5–5.3)
Sodium: 138 mmol/L (ref 135–146)

## 2020-05-28 LAB — CBC WITH DIFFERENTIAL/PLATELET
Absolute Monocytes: 336 cells/uL (ref 200–950)
Basophils Absolute: 39 cells/uL (ref 0–200)
Basophils Relative: 0.7 %
Eosinophils Absolute: 179 cells/uL (ref 15–500)
Eosinophils Relative: 3.2 %
HCT: 42.8 % (ref 38.5–50.0)
Hemoglobin: 14.1 g/dL (ref 13.2–17.1)
Lymphs Abs: 1814 cells/uL (ref 850–3900)
MCH: 29 pg (ref 27.0–33.0)
MCHC: 32.9 g/dL (ref 32.0–36.0)
MCV: 88.1 fL (ref 80.0–100.0)
MPV: 9.3 fL (ref 7.5–12.5)
Monocytes Relative: 6 %
Neutro Abs: 3231 cells/uL (ref 1500–7800)
Neutrophils Relative %: 57.7 %
Platelets: 167 10*3/uL (ref 140–400)
RBC: 4.86 10*6/uL (ref 4.20–5.80)
RDW: 13.5 % (ref 11.0–15.0)
Total Lymphocyte: 32.4 %
WBC: 5.6 10*3/uL (ref 3.8–10.8)

## 2020-05-28 LAB — HEPATIC FUNCTION PANEL
AG Ratio: 1.4 (calc) (ref 1.0–2.5)
ALT: 13 U/L (ref 9–46)
AST: 19 U/L (ref 10–35)
Albumin: 4.1 g/dL (ref 3.6–5.1)
Alkaline phosphatase (APISO): 55 U/L (ref 35–144)
Bilirubin, Direct: 0.1 mg/dL (ref 0.0–0.2)
Globulin: 2.9 g/dL (calc) (ref 1.9–3.7)
Indirect Bilirubin: 0.4 mg/dL (calc) (ref 0.2–1.2)
Total Bilirubin: 0.5 mg/dL (ref 0.2–1.2)
Total Protein: 7 g/dL (ref 6.1–8.1)

## 2020-05-28 NOTE — Assessment & Plan Note (Signed)
Right wrist improving, no further antibx tx needed

## 2020-05-28 NOTE — Assessment & Plan Note (Signed)
Lab Results  Component Value Date   HGBA1C 5.8 (H) 08/05/2019   Stable, pt to continue current medical treatment  - diet

## 2020-05-28 NOTE — Assessment & Plan Note (Signed)
Other rash uncertain etiology, most likely allergic possibly contact related though I cant explain the large 4x4 cm confluent erythema nontender area right buttock, and due to the purplish character of some lesions to the left arm and leg we will check cbc and labs, o/w ok to continue topical steroid, but also depomedrol im 80, and predpac asd  to f/u any worsening symptoms or concerns

## 2020-06-09 NOTE — Telephone Encounter (Signed)
Followed up with PA and it needed more information expected turn around time is 72 hrs

## 2020-06-09 NOTE — Telephone Encounter (Signed)
  Patient calling for status of prior auth or alternative sleep medication

## 2020-06-14 MED ORDER — DOXEPIN HCL 3 MG PO TABS: ORAL_TABLET | ORAL | 1 refills | Status: DC

## 2020-06-14 NOTE — Telephone Encounter (Signed)
Follow up message    Patient calling for status of medication prior auth  Patient requesting call

## 2020-06-14 NOTE — Addendum Note (Signed)
Addended by: Biagio Borg on: 06/14/2020 06:54 PM   Modules accepted: Orders

## 2020-06-14 NOTE — Telephone Encounter (Signed)
PA denied on 06/11/20: "We denied this request under Medicare Part D because: The information provided by your prescriber did not  meet the requirements for covering this medication (prior authorization).  Your plan does not allow coverage of this medication based on your prescriber answering No to the following  question(s): Has the patient experienced an inadequate treatment response to the non-HRM (non-High Risk Medication)  alternative drug doxepin (3 mg or 6 mg)?  Has the patient experienced an intolerance to the non-HRM (non-High Risk Medication) alternative drug  doxepin (3 mg or 6 mg)?"  Consider alternative.

## 2020-06-14 NOTE — Telephone Encounter (Signed)
Ok to contact pt  Lunesta not approved  Ok for doxepin 3 mg qhs - I will do rx

## 2020-06-15 NOTE — Telephone Encounter (Signed)
Patient has spoken with insurance company. Insurance has faxed some document over to our office. Box not yet checked.

## 2020-06-27 NOTE — Telephone Encounter (Signed)
  Is there a prior auth pending for Doxepin. Patient  calling to report Doxepin HCl 3 MG TABS  will cost $140 (he can not afford)

## 2020-06-28 DIAGNOSIS — M7542 Impingement syndrome of left shoulder: Secondary | ICD-10-CM | POA: Diagnosis not present

## 2020-06-28 DIAGNOSIS — M19011 Primary osteoarthritis, right shoulder: Secondary | ICD-10-CM | POA: Diagnosis not present

## 2020-06-28 DIAGNOSIS — M7541 Impingement syndrome of right shoulder: Secondary | ICD-10-CM | POA: Diagnosis not present

## 2020-06-28 DIAGNOSIS — M19012 Primary osteoarthritis, left shoulder: Secondary | ICD-10-CM | POA: Diagnosis not present

## 2020-07-05 ENCOUNTER — Telehealth: Payer: Self-pay | Admitting: Internal Medicine

## 2020-07-05 MED ORDER — TRAZODONE HCL 50 MG PO TABS: 25.0000 mg | ORAL_TABLET | Freq: Every evening | ORAL | 1 refills | Status: DC | PRN

## 2020-07-05 NOTE — Telephone Encounter (Signed)
Ok to let pt know  We received information from pharmacy that the lunesta, and now the doxepin are not covered by his insurance for sleep  He has alredy tried [place] in the past  Ok to try trazodone - I will send rx

## 2020-07-07 NOTE — Telephone Encounter (Signed)
Different prescription sent to pharmacy 

## 2020-07-07 NOTE — Telephone Encounter (Signed)
Pt notified of Dr Jenny Reichmann response & verb understanding.  Instructed to call office if any ques/concerns.

## 2020-07-27 ENCOUNTER — Other Ambulatory Visit: Payer: Self-pay

## 2020-07-27 ENCOUNTER — Ambulatory Visit (INDEPENDENT_AMBULATORY_CARE_PROVIDER_SITE_OTHER): Payer: Medicare HMO | Admitting: Internal Medicine

## 2020-07-27 ENCOUNTER — Encounter: Payer: Self-pay | Admitting: Internal Medicine

## 2020-07-27 VITALS — BP 132/82 | HR 60 | Temp 98.3°F | Ht 71.0 in | Wt 171.0 lb

## 2020-07-27 DIAGNOSIS — R739 Hyperglycemia, unspecified: Secondary | ICD-10-CM

## 2020-07-27 DIAGNOSIS — Z0001 Encounter for general adult medical examination with abnormal findings: Secondary | ICD-10-CM

## 2020-07-27 DIAGNOSIS — I700 Atherosclerosis of aorta: Secondary | ICD-10-CM | POA: Diagnosis not present

## 2020-07-27 DIAGNOSIS — L089 Local infection of the skin and subcutaneous tissue, unspecified: Secondary | ICD-10-CM | POA: Diagnosis not present

## 2020-07-27 DIAGNOSIS — R21 Rash and other nonspecific skin eruption: Secondary | ICD-10-CM | POA: Diagnosis not present

## 2020-07-27 DIAGNOSIS — E7800 Pure hypercholesterolemia, unspecified: Secondary | ICD-10-CM

## 2020-07-27 HISTORY — DX: Atherosclerosis of aorta: I70.0

## 2020-07-27 MED ORDER — ATORVASTATIN CALCIUM 20 MG PO TABS: 20.0000 mg | ORAL_TABLET | Freq: Every day | ORAL | 3 refills | Status: DC

## 2020-07-27 NOTE — Patient Instructions (Signed)
Ok to increase the lipitor to 20 mg per day  Please continue all other medications as before, and refills have been done if requested.  Please have the pharmacy call with any other refills you may need.  Please continue your efforts at being more active, low cholesterol diet, and weight control.  You are otherwise up to date with prevention measures today.  Please keep your appointments with your specialists as you may have planned  Please make an Appointment to return for your 1 year visit, or sooner if needed, with Lab testing by Appointment as well, to be done about 3-5 days before at the [facility] (so this is for TWO appointments - please see the scheduling desk as you leave)  Due to the ongoing Covid 19 pandemic, our lab now requires an appointment for any labs done at our office.  If you need labs done and do not have an appointment, please call our office ahead of time to schedule before presenting to the lab for your testing.

## 2020-07-27 NOTE — Assessment & Plan Note (Signed)
Pt to continue statin, low chol diet, excericse

## 2020-07-27 NOTE — Assessment & Plan Note (Signed)
Resolved, continue to follow for any worsening s/s

## 2020-07-27 NOTE — Assessment & Plan Note (Signed)
Lab Results  Component Value Date   LDLCALC 90 08/05/2019   Uncontrolled, goal ld l< 70, pt to increase lipitor to 20 qd

## 2020-07-27 NOTE — Assessment & Plan Note (Signed)
Age and sex appropriate education and counseling updated with regular exercise and diet Referrals for preventative services - none needed Immunizations addressed - declines shigrix Smoking counseling  - none needed Evidence for depression or other mood disorder - none significant Most recent labs reviewed. I have personally reviewed and have noted: 1) the patient's medical and social history 2) The patient's current medications and supplements 3) The patient's height, weight, and BMI have been recorded in the chart

## 2020-07-27 NOTE — Assessment & Plan Note (Signed)
Lab Results  Component Value Date   HGBA1C 5.8 (H) 08/05/2019   Stable, pt to continue current medical treatment  - diet

## 2020-07-27 NOTE — Progress Notes (Signed)
Chief Complaint:: wellness exam and hld, aortic atherosclerosis, rash, skin infection       HPI:  Jason Robles is a 75 y.o. male here for wellness exam; declnes shingirx, o/w up to date with preventive referrals and immunizations                        Also trying to follow lower cholesterol diet, has known CAD by CT, and aortic atherosclerosis with goal LDL now < 70.  Taking ASA 81 qd.  Pt denies chest pain, increased sob or doe, wheezing, orthopnea, PND, increased LE swelling, palpitations, dizziness or syncope.   Pt denies polydipsia, polyuria, or new focal neuro s/s.   Pt denies fever, wt loss, night sweats, loss of appetite, or other constitutional symptoms   Recent skin infection and rash resolved.   Pt denies fever, wt loss, night sweats, loss of appetite, or other constitutional symptoms     Wt Readings from Last 3 Encounters:  07/27/20 171 lb (77.6 kg)  05/27/20 176 lb (79.8 kg)  05/24/20 177 lb 3.2 oz (80.4 kg)   BP Readings from Last 3 Encounters:  07/27/20 132/82  05/27/20 (!) 148/70  05/24/20 122/80   Immunization History  Administered Date(s) Administered   Fluad Quad(high Dose 65+) 09/09/2019   Influenza, High Dose Seasonal PF 09/12/2016   Influenza, Quadrivalent, Recombinant, Inj, Pf 11/01/2018   Influenza-Unspecified 11/06/2015   PFIZER Comirnaty(Gray Top)Covid-19 Tri-Sucrose Vaccine 05/18/2020   PFIZER(Purple Top)SARS-COV-2 Vaccination 02/22/2019, 03/18/2019   Pneumococcal Conjugate-13 07/31/2018   Pneumococcal Polysaccharide-23 11/06/2015   Pneumococcal-Unspecified 11/06/2015   Tdap 09/28/2010   Zoster, Live 09/28/2010   There are no preventive care reminders to display for this patient.     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   HYPERLIPIDEMIA 08/21/2006   INSOMNIA-SLEEP DISORDER-UNSPEC 09/20/2009   NONSPECIFIC ABN FINDING RAD & OTH EXAM GU ORGAN 05/03/2008   PERIPHERAL VASCULAR DISEASE 08/25/2007   PONV (postoperative nausea and vomiting)    Raynaud's syndrome 08/21/2006   Past Surgical History:  Procedure Laterality Date   APPENDECTOMY     COLONOSCOPY  07/25/2006   s/p knee surgery Bilateral    x's 4 since 1980   s/p left shoulder  2002   rotator cuff   s/p lumbar disc surgery  2007   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 His smoking use included cigarettes. He has a 15.00 pack-year smoking history. He has never used smokeless tobacco. He reports current alcohol use of about 4.0 standard drinks  of alcohol per week. He reports that he does not use drugs. family history includes Alzheimer's disease in his mother; Diabetes in his maternal uncle and mother; Kidney cancer in his brother. Allergies  Allergen Reactions   Iohexol Itching    Itching post 125cc Omni injection. Treated w/ 50 mg PO benedryl.  Onset Date: [id number]    Current Outpatient Medications on File Prior to Visit  Medication Sig Dispense Refill   aspirin 81 MG EC tablet Take 1 tablet (81 mg total) by mouth daily. Swallow whole. 30 tablet 12   Doxepin  HCl 3 MG TABS 1 tab by mouth at bedtime as needed 90 tablet 1   eszopiclone (LUNESTA) 2 MG TABS tablet Take 1 tablet (2 mg total) by mouth at bedtime as needed for sleep. Take immediately before bedtime 90 tablet 1   ketoconazole (NIZORAL) 2 % cream Apply 1 application topically daily. 30 g 2   meloxicam (MOBIC) 15 MG tablet TAKE 1 TABLET BY MOUTH EVERY DAY 90 tablet 2   metoprolol tartrate (LOPRESSOR) 25 MG tablet Take 1 tablet by mouth 2 hours prior to Cardiac CT 1 tablet 0   nitroGLYCERIN (NITROSTAT) 0.4 MG SL tablet Place 1 tablet (0.4 mg total) under the tongue every 5 (five) minutes as needed for chest pain. 25 tablet 3   pantoprazole (PROTONIX) 40 MG tablet Take 1 tablet (40 mg total) by mouth daily. 90 tablet 3   traMADol (ULTRAM) 50 MG tablet Take 1 tablet (50 mg total) by mouth every 12 (twelve) hours as needed. 180 tablet 0   traZODone (DESYREL) 50 MG tablet Take 0.5-1 tablets (25-50 mg total) by mouth at bedtime as needed for sleep. 90 tablet 1   triamcinolone (NASACORT) 55 MCG/ACT AERO nasal inhaler Place 2 sprays into the nose daily. 1 Inhaler 12   diphenhydrAMINE (BENADRYL) 50 MG tablet Take 1 tablet by mouth 1 hour prior to Cardiac CT (Patient not taking: Reported on 07/27/2020) 1 tablet 0   predniSONE (DELTASONE) 10 MG tablet 3 tabs by mouth per day for 3 days, 2tabs per day for 3 days,1tab per day for 3 days (Patient not taking: Reported on 07/27/2020) 18 tablet 0   triamcinolone cream (KENALOG) 0.1 % Apply 1 application topically 2 (two) times daily. (Patient not taking: Reported on 07/27/2020) 100 g 0   No current facility-administered medications on file prior to visit.        ROS:  All others reviewed and negative.  Objective        PE:  BP 132/82   Pulse 60   Temp 98.3 F (36.8 C) (Oral)   Ht '5\' 11"'$  (1.803 m)   Wt 171 lb (77.6 kg)   SpO2 99%   BMI 23.85 kg/m                 Constitutional: Pt appears in NAD               HENT: Head: NCAT.                Right Ear:  External ear normal.                 Left Ear: External ear normal.                Eyes: . Pupils are equal, round, and reactive to light. Conjunctivae and EOM are normal               Nose: without d/c or  deformity               Neck: Neck supple. Gross normal ROM               Cardiovascular: Normal rate and regular rhythm.                 Pulmonary/Chest: Effort normal and breath sounds without rales or wheezing.                Abd:  Soft, NT, ND, + BS, no organomegaly               Neurological: Pt is alert. At baseline orientation, motor grossly intact               Skin: Skin is warm. No rashes, no other new lesions, LE edema - none               Psychiatric: Pt behavior is normal without agitation   Micro: none  Cardiac tracings I have personally interpreted today:  none  Pertinent Radiological findings (summarize): none   Lab Results  Component Value Date   WBC 5.6 05/27/2020   HGB 14.1 05/27/2020   HCT 42.8 05/27/2020   PLT 167 05/27/2020   GLUCOSE 96 05/27/2020   CHOL 181 08/05/2019   TRIG 95 08/05/2019   HDL 72 08/05/2019   LDLDIRECT 100.0 07/14/2018   LDLCALC 90 08/05/2019   ALT 13 05/27/2020   AST 19 05/27/2020   NA 138 05/27/2020   K 4.2 05/27/2020   CL 100 05/27/2020   CREATININE 0.94 05/27/2020   BUN 22 05/27/2020   CO2 27 05/27/2020   TSH 1.00 08/05/2019   PSA 2.4 08/05/2019   INR 0.89 05/02/2012   HGBA1C 5.8 (H) 08/05/2019   Assessment/Plan:  Jason Robles is a 75 y.o. Other or two or more races [6] male with  has a past medical history of ABDOMINAL PAIN OTHER SPECIFIED SITE (04/30/2008), ALLERGIC RHINITIS (08/25/2007), Anxiety (06/28/2010), BENIGN PROSTATIC HYPERTROPHY (08/21/2006), Chest pain (06/28/2010), COLONIC POLYPS, HX OF (XX123456), Complication of anesthesia, DEGENERATIVE JOINT DISEASE, KNEES, BILATERAL (08/21/2006), Diverticulosis of colon (without mention of hemorrhage) (04/18/2007), EXTERNAL HEMORRHOIDS (04/18/2007), Headache(784.0)  (09/20/2009), HEARING LOSS, RIGHT EAR (08/21/2006), HYPERLIPIDEMIA (08/21/2006), INSOMNIA-SLEEP DISORDER-UNSPEC (09/20/2009), NONSPECIFIC ABN FINDING RAD & OTH EXAM GU ORGAN (05/03/2008), PERIPHERAL VASCULAR DISEASE (08/25/2007), PONV (postoperative nausea and vomiting), and Raynaud's syndrome (08/21/2006).  Encounter for well adult exam with abnormal findings Age and sex appropriate education and counseling updated with regular exercise and diet Referrals for preventative services - none needed Immunizations addressed - declines shigrix Smoking counseling  - none needed Evidence for depression or other mood disorder - none significant Most recent labs reviewed. I have personally reviewed and have noted: 1) the patient's medical and social history 2) The patient's current medications and supplements 3) The patient's height, weight, and BMI have been recorded in the chart   Hyperglycemia Lab Results  Component Value Date   HGBA1C 5.8 (H) 08/05/2019   Stable, pt to continue current medical treatment  - diet   Hyperlipidemia Lab Results  Component Value Date   [place] 90 08/05/2019   Uncontrolled, goal ld l< 70, pt to increase lipitor to 20 qd   Skin infection Resolved, continue to follow for any worsening s/s  Rash Resolved, continue to follow for any worsening s/s  Aortic atherosclerosis (HCC) Pt to continue statin, low chol diet, excericse  Followup: Return in about 1 year (around 07/27/2021).  Cathlean Cower, MD 07/27/2020  10:22 PM [facility]

## 2020-09-22 DIAGNOSIS — M19012 Primary osteoarthritis, left shoulder: Secondary | ICD-10-CM | POA: Diagnosis not present

## 2020-09-22 DIAGNOSIS — M19011 Primary osteoarthritis, right shoulder: Secondary | ICD-10-CM | POA: Diagnosis not present

## 2020-10-03 ENCOUNTER — Other Ambulatory Visit: Payer: Self-pay | Admitting: Internal Medicine

## 2020-10-03 NOTE — Telephone Encounter (Signed)
Please refill as per office routine med refill policy (all routine meds to be refilled for 3 mo or monthly (per pt preference) up to one year from last visit, then month to month grace period for 3 mo, then further med refills will have to be denied) ? ?

## 2020-10-17 ENCOUNTER — Telehealth: Payer: Self-pay | Admitting: Internal Medicine

## 2020-10-17 DIAGNOSIS — Z1211 Encounter for screening for malignant neoplasm of colon: Secondary | ICD-10-CM

## 2020-10-17 NOTE — Telephone Encounter (Signed)
Patient is requesting new referral be submitted for colonoscopy

## 2020-10-17 NOTE — Telephone Encounter (Signed)
Ok done

## 2021-01-01 ENCOUNTER — Other Ambulatory Visit: Payer: Self-pay | Admitting: Internal Medicine

## 2021-01-24 ENCOUNTER — Encounter: Payer: Self-pay | Admitting: Gastroenterology

## 2021-02-01 ENCOUNTER — Other Ambulatory Visit: Payer: Self-pay | Admitting: Internal Medicine

## 2021-02-27 ENCOUNTER — Ambulatory Visit (AMBULATORY_SURGERY_CENTER): Payer: Medicare HMO | Admitting: *Deleted

## 2021-02-27 ENCOUNTER — Other Ambulatory Visit: Payer: Self-pay

## 2021-02-27 ENCOUNTER — Encounter: Payer: Self-pay | Admitting: Gastroenterology

## 2021-02-27 VITALS — Ht 71.0 in | Wt 174.0 lb

## 2021-02-27 DIAGNOSIS — Z86010 Personal history of colonic polyps: Secondary | ICD-10-CM

## 2021-02-27 MED ORDER — NA SULFATE-K SULFATE-MG SULF 17.5-3.13-1.6 GM/177ML PO SOLN: 1.0000 | Freq: Once | ORAL | 0 refills | Status: AC

## 2021-02-27 NOTE — Progress Notes (Signed)

## 2021-03-13 ENCOUNTER — Ambulatory Visit (AMBULATORY_SURGERY_CENTER): Payer: Medicare HMO | Admitting: Gastroenterology

## 2021-03-13 ENCOUNTER — Encounter: Payer: Self-pay | Admitting: Gastroenterology

## 2021-03-13 ENCOUNTER — Other Ambulatory Visit: Payer: Self-pay

## 2021-03-13 VITALS — BP 110/76 | HR 71 | Temp 96.8°F | Resp 22 | Ht 71.0 in | Wt 174.0 lb

## 2021-03-13 DIAGNOSIS — K635 Polyp of colon: Secondary | ICD-10-CM

## 2021-03-13 DIAGNOSIS — Z86010 Personal history of colonic polyps: Secondary | ICD-10-CM

## 2021-03-13 DIAGNOSIS — R69 Illness, unspecified: Secondary | ICD-10-CM | POA: Diagnosis not present

## 2021-03-13 DIAGNOSIS — D123 Benign neoplasm of transverse colon: Secondary | ICD-10-CM | POA: Diagnosis not present

## 2021-03-13 MED ORDER — SODIUM CHLORIDE 0.9 % IV SOLN: 500.0000 mL | Freq: Once | INTRAVENOUS | Status: DC

## 2021-03-13 NOTE — Op Note (Signed)
[facility] ?Patient Name: Jason Robles ?Procedure Date: 03/13/2021 7:53 AM ?MRN: [id number] ?Endoscopist: Carlota Raspberry. Havery Moros , MD ?Age: 76 ?Referring MD:  ?Date of Birth: 1945-12-05 ?Gender: Male ?Account #: [id number] ?Procedure:                Colonoscopy ?Indications:              High risk colon cancer surveillance: Personal  ?                          history of colonic polyps (adenoma removed 03/2018 -  ?                          that exam was limited by fair prep), double prep  ?                          used for this exam ?Medicines:                Monitored Anesthesia Care ?Procedure:                Pre-Anesthesia Assessment: ?                          - Prior to the procedure, a History and Physical  ?                          was performed, and patient medications and  ?                          allergies were reviewed. The patient's tolerance of  ?                          previous anesthesia was also reviewed. The risks  ?                          and benefits of the procedure and the sedation  ?                          options and risks were discussed with the patient.  ?                          All questions were answered, and informed consent  ?                          was obtained. Prior Anticoagulants: The patient has  ?                          taken no previous anticoagulant or antiplatelet  ?                          agents. ASA Grade Assessment: III - A patient with  ?                          severe systemic disease. After reviewing the risks  ?  and benefits, the patient was deemed in  ?                          satisfactory condition to undergo the procedure. ?                          After obtaining informed consent, the colonoscope  ?                          was passed under direct vision. Throughout the  ?                          procedure, the patient's blood pressure, pulse, and  ?                          oxygen saturations were monitored  continuously. The  ?                          Olympus CF-[id number] (#[id number]) Colonoscope was  ?                          introduced through the anus and advanced to the the  ?                          cecum, identified by appendiceal orifice and  ?                          ileocecal valve. The colonoscopy was performed  ?                          without difficulty. The patient tolerated the  ?                          procedure well. The quality of the bowel  ?                          preparation was adequate. The ileocecal valve,  ?                          appendiceal orifice, and rectum were photographed. ?Scope In: 8:11:42 AM ?Scope Out: 8:34:51 AM ?Scope Withdrawal Time: 0 hours 17 minutes 42 seconds  ?Total Procedure Duration: 0 hours 23 minutes 9 seconds  ?Findings:                 The perianal and digital rectal examinations were  ?                          normal. ?                          Many small-mouthed diverticula were found in the  ?                          sigmoid colon. ?  Two sessile polyps were found in the transverse  ?                          colon. The polyps were 4 to 6 mm in size. These  ?                          polyps were removed with a cold snare. Resection  ?                          and retrieval were complete. ?                          Internal hemorrhoids were found during retroflexion. ?                          The colon (entire examined portion) was tortuous. ?                          The exam was otherwise without abnormality. ?Complications:            No immediate complications. Estimated blood loss:  ?                          Minimal. ?Estimated Blood Loss:     Estimated blood loss was minimal. ?Impression:               - Diverticulosis in the sigmoid colon. ?                          - Two 4 to 6 mm polyps in the transverse colon,  ?                          removed with a cold snare. Resected and retrieved. ?                          -  Internal hemorrhoids. ?                          - Tortuous colon. ?                          - The examination was otherwise normal. ?Recommendation:           - Patient has a contact number available for  ?                          emergencies. The signs and symptoms of potential  ?                          delayed complications were discussed with the  ?                          patient. Return to normal activities tomorrow.  ?                          Written discharge instructions  were provided to the  ?                          patient. ?                          - Resume previous diet. ?                          - Continue present medications. ?                          - Await pathology results. ?Carlota Raspberry. Jamiyah Dingley, MD ?03/13/2021 8:39:04 AM ?This report has been signed electronically. ?

## 2021-03-13 NOTE — Progress Notes (Signed)
VS completed by CW.   Pt's states no medical or surgical changes since previsit or office visit.  

## 2021-03-13 NOTE — Progress Notes (Signed)
PT taken to PACU. Monitors in place. VSS. Report given to RN. 

## 2021-03-13 NOTE — Patient Instructions (Signed)
Read all of the handouts given to you by your recovery room nurse.  YOU HAD AN ENDOSCOPIC PROCEDURE TODAY AT THE [facility]:   Refer to the procedure report that was given to you for any specific questions about what was found during the examination.  If the procedure report does not answer your questions, please call your gastroenterologist to clarify.  If you requested that your care partner not be given the details of your procedure findings, then the procedure report has been included in a sealed envelope for you to review at your convenience later.  YOU SHOULD EXPECT: Some feelings of bloating in the abdomen. Passage of more gas than usual.  Walking can help get rid of the air that was put into your GI tract during the procedure and reduce the bloating. If you had a lower endoscopy (such as a colonoscopy or flexible sigmoidoscopy) you may notice spotting of blood in your stool or on the toilet paper. If you underwent a bowel prep for your procedure, you may not have a normal bowel movement for a few days.  Please Note:  You might notice some irritation and congestion in your nose or some drainage.  This is from the oxygen used during your procedure.  There is no need for concern and it should clear up in a day or so.  SYMPTOMS TO REPORT IMMEDIATELY:  Following lower endoscopy (colonoscopy or flexible sigmoidoscopy):  Excessive amounts of blood in the stool  Significant tenderness or worsening of abdominal pains  Swelling of the abdomen that is new, acute  Fever of 100F or higher   For urgent or emergent issues, a gastroenterologist can be reached at any hour by calling [phone]. Do not use MyChart messaging for urgent concerns.    DIET:  We do recommend a small meal at first, but then you may proceed to your regular diet.  Drink plenty of fluids but you should avoid alcoholic beverages for 24 hours. Try to increase the fiber in your diet, and drink plenty of  water.  ACTIVITY:  You should plan to take it easy for the rest of today and you should NOT DRIVE or use heavy machinery until tomorrow (because of the sedation medicines used during the test).    FOLLOW UP: Our staff will call the number listed on your records 48-72 hours following your procedure to check on you and address any questions or concerns that you may have regarding the information given to you following your procedure. If we do not reach you, we will leave a message.  We will attempt to reach you two times.  During this call, we will ask if you have developed any symptoms of COVID 19. If you develop any symptoms (ie: fever, flu-like symptoms, shortness of breath, cough etc.) before then, please call [phone].  If you test positive for Covid 19 in the 2 weeks post procedure, please call and report this information to us.    If any biopsies were taken you will be contacted by phone or by letter within the next 1-3 weeks.  Please call us at [phone] if you have not heard about the biopsies in 3 weeks.    SIGNATURES/CONFIDENTIALITY: You and/or your care partner have signed paperwork which will be entered into your electronic medical record.  These signatures attest to the fact that that the information above on your After Visit Summary has been reviewed and is understood.  Full responsibility of the confidentiality of this   discharge information lies with you and/or your care-partner.  

## 2021-03-13 NOTE — Progress Notes (Signed)
Called to room to assist during endoscopic procedure.  Patient ID and intended procedure confirmed with present staff. Received instructions for my participation in the procedure from the performing physician.  

## 2021-03-13 NOTE — Progress Notes (Signed)
[facility] History and Physical   Primary Care Physician:  Biagio Borg, MD   Reason for Procedure:   History of colon polyps  Plan:    colonoscopy     HPI: Jason Robles is a 76 y.o. male  here for colonoscopy surveillance - adenoma removed 03/2018 but prep only fair. Here for repeat exam with double prep. Patient denies any bowel symptoms at this time. No family history of colon cancer known. Otherwise feels well without any cardiopulmonary symptoms.    Past Medical History:  Diagnosis Date   ABDOMINAL PAIN OTHER SPECIFIED SITE 04/30/2008   ALLERGIC RHINITIS 08/25/2007   Anxiety 06/28/2010   BENIGN PROSTATIC HYPERTROPHY 08/21/2006   Chest pain 06/28/2010   COLONIC POLYPS, HX OF 17/51/0258   Complication of anesthesia    post op nausea and vomiting and wakes up too early per pt   DEGENERATIVE JOINT DISEASE, KNEES, 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   Diverticulosis of colon (without mention of hemorrhage) 04/18/2007   EXTERNAL HEMORRHOIDS 04/18/2007   GERD (gastroesophageal reflux disease)    Headache(784.0) 09/20/2009   hx migraines   HEARING LOSS, 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   HYPERLIPIDEMIA 08/21/2006   INSOMNIA-SLEEP DISORDER-UNSPEC 09/20/2009   NONSPECIFIC ABN FINDING RAD & OTH EXAM GU ORGAN 05/03/2008   PERIPHERAL VASCULAR DISEASE 08/25/2007   PONV (postoperative nausea and vomiting)    Raynaud's syndrome 08/21/2006    Past Surgical History:  Procedure Laterality Date   APPENDECTOMY     COLONOSCOPY  07/25/2006   POLYPECTOMY     s/p knee surgery Bilateral    x's 4 since 1980   s/p left shoulder  2002   rotator cuff   s/p lumbar disc surgery  2007   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 Authorizing Provider  aspirin 81 MG EC tablet Take 1 tablet (81 mg total) by mouth daily. Swallow whole. 09/12/16  Yes Biagio Borg, MD  atorvastatin (LIPITOR) 20 MG tablet Take 1 tablet (20 mg total) by mouth daily. 07/27/20 07/27/21 Yes Biagio Borg, MD  meloxicam (MOBIC) 15 MG tablet TAKE 1 TABLET BY MOUTH EVERY DAY 02/01/21  Yes Biagio Borg, MD  pantoprazole (PROTONIX) 40 MG tablet TAKE 1 TABLET BY MOUTH EVERY DAY 10/05/20  Yes Biagio Borg, MD  traZODone (DESYREL) 50 MG tablet TAKE 0.5-1 TABLETS BY MOUTH AT BEDTIME AS NEEDED FOR SLEEP. 01/01/21  Yes Biagio Borg, MD  diphenhydrAMINE (BENADRYL) 50 MG tablet Take 1 tablet by mouth 1 hour prior to Cardiac CT Patient not taking: Reported on 07/27/2020 10/22/19   Jettie Booze, MD  Doxepin HCl 3 MG TABS 1 tab by mouth at bedtime as needed Patient not taking: Reported on 03/13/2021 06/14/20   Biagio Borg, MD  eszopiclone (LUNESTA) 2 MG TABS tablet Take 1 tablet (2 mg total) by mouth at bedtime as needed for sleep. Take immediately before bedtime Patient not taking: Reported on 02/27/2021 01/18/20   Biagio Borg, MD  ketoconazole (NIZORAL) 2 % cream Apply 1 application topically daily. Patient  not taking: Reported on 02/27/2021 08/03/19   Biagio Borg, MD  metoprolol tartrate (LOPRESSOR) 25 MG tablet Take 1 tablet by mouth 2 hours prior to Cardiac CT Patient not taking: Reported on 02/27/2021 10/22/19   Jettie Booze, MD  nitroGLYCERIN (NITROSTAT) 0.4 MG SL tablet Place 1 tablet (0.4 mg total) under the tongue every 5 (five) minutes as needed for chest pain. Patient not taking: Reported on 02/27/2021 10/22/19   Jettie Booze, MD  predniSONE (DELTASONE) 10 MG tablet 3 tabs by mouth per day for 3 days, 2tabs per day for 3 days,1tab per day for 3  days Patient not taking: Reported on 07/27/2020 05/27/20   Biagio Borg, MD  traMADol (ULTRAM) 50 MG tablet Take 1 tablet (50 mg total) by mouth every 12 (twelve) hours as needed. Patient not taking: Reported on 02/27/2021 04/15/19   Janith Lima, MD  triamcinolone (NASACORT) 55 MCG/ACT AERO nasal inhaler Place 2 sprays into the nose daily. Patient not taking: Reported on 02/27/2021 07/23/19   Biagio Borg, MD  triamcinolone cream (KENALOG) 0.1 % Apply 1 application topically 2 (two) times daily. Patient not taking: Reported on 07/27/2020 05/24/20   Hoyt Koch, MD    Current Outpatient Medications  Medication Sig Dispense Refill   aspirin 81 MG EC tablet Take 1 tablet (81 mg total) by mouth daily. Swallow whole. 30 tablet 12   atorvastatin (LIPITOR) 20 MG tablet Take 1 tablet (20 mg total) by mouth daily. 90 tablet 3   meloxicam (MOBIC) 15 MG tablet TAKE 1 TABLET BY MOUTH EVERY DAY 90 tablet 3   pantoprazole (PROTONIX) 40 MG tablet TAKE 1 TABLET BY MOUTH EVERY DAY 90 tablet 3   traZODone (DESYREL) 50 MG tablet TAKE 0.5-1 TABLETS BY MOUTH AT BEDTIME AS NEEDED FOR SLEEP. 90 tablet 1   diphenhydrAMINE (BENADRYL) 50 MG tablet Take 1 tablet by mouth 1 hour prior to Cardiac CT (Patient not taking: Reported on 07/27/2020) 1 tablet 0   Doxepin HCl 3 MG TABS 1 tab by mouth at bedtime as needed (Patient not taking: Reported on 03/13/2021) 90 tablet 1   eszopiclone (LUNESTA) 2 MG TABS tablet Take 1 tablet (2 mg total) by mouth at bedtime as needed for sleep. Take immediately before bedtime (Patient not taking: Reported on 02/27/2021) 90 tablet 1   ketoconazole (NIZORAL) 2 % cream Apply 1 application topically daily. (Patient not taking: Reported on 02/27/2021) 30 g 2   metoprolol tartrate (LOPRESSOR) 25 MG tablet Take 1 tablet by mouth 2 hours prior to Cardiac CT (Patient not taking: Reported on 02/27/2021) 1 tablet 0   nitroGLYCERIN (NITROSTAT) 0.4 MG SL tablet Place 1 tablet (0.4 mg total) under the  tongue every 5 (five) minutes as needed for chest pain. (Patient not taking: Reported on 02/27/2021) 25 tablet 3   predniSONE (DELTASONE) 10 MG tablet 3 tabs by mouth per day for 3 days, 2tabs per day for 3 days,1tab per day for 3 days (Patient not taking: Reported on 07/27/2020) 18 tablet 0   traMADol (ULTRAM) 50 MG tablet Take 1 tablet (50 mg total) by mouth every 12 (twelve) hours as needed. (Patient not taking: Reported on 02/27/2021) 180 tablet 0   triamcinolone (NASACORT) 55 MCG/ACT AERO nasal inhaler Place 2 sprays into the nose daily. (Patient not taking: Reported on 02/27/2021) 1 Inhaler 12   triamcinolone cream (KENALOG) 0.1 % Apply 1 application topically 2 (two) times daily. (Patient not taking: Reported on 07/27/2020) 100 g  0   Current Facility-Administered Medications  Medication Dose Route Frequency Provider Last Rate Last Admin   0.9 %  sodium chloride infusion  500 mL Intravenous Once Jaiyah Beining, Carlota Raspberry, MD        Allergies as of 03/13/2021 - Review Complete 03/13/2021  Allergen Reaction Noted   Iohexol Itching 05/03/2008    Family History  Problem Relation Age of Onset   Diabetes Mother    Alzheimer's disease Mother    Kidney cancer Brother    Diabetes Maternal Uncle    Colon cancer Neg Hx    Colon polyps Neg Hx    Esophageal cancer Neg Hx    Rectal cancer Neg Hx    Stomach cancer Neg Hx     Social History   Socioeconomic History   Marital status: Married    Spouse name: Not on file   Number of children: Not on file   Years of education: Not on file   Highest education level: Not on file  Occupational History   Occupation: self employed retail picture framing    Occupation: retired Higher education careers adviser  Tobacco Use   Smoking status: Former    Packs/day: 1.00    Years: 15.00    Pack years: 15.00    Types: Cigarettes    Quit date: 01/31/1980    Years since quitting: 41.1    Passive exposure: Never   Smokeless tobacco: Never  Vaping Use   Vaping Use: Never used   Substance and Sexual Activity   Alcohol use: Yes    Alcohol/week: 4.0 standard drinks    Types: 4 Glasses of wine per week    Comment: ONCE A WEEK   Drug use: No   Sexual activity: Yes  Other Topics Concern   Not on file  Social History Narrative   [place]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  Physical Exam: Vital signs BP 122/86    Pulse 72    Temp (!) 96.8 F (36 C) (Temporal)    Ht '5\' 11"'$  (1.803 m)    Wt 174 lb (78.9 kg)    SpO2 100%    BMI 24.27 kg/m   General:   Alert,  Well-developed, pleasant and cooperative in NAD Lungs:  Clear throughout to auscultation.   Heart:  Regular rate and rhythm Abdomen:  Soft, nontender and nondistended.   Neuro/Psych:  Alert and cooperative. Normal mood and affect. A and O x 3  Jolly Mango, MD [facility]

## 2021-03-15 ENCOUNTER — Telehealth: Payer: Self-pay | Admitting: *Deleted

## 2021-03-15 NOTE — Telephone Encounter (Signed)
?  Follow up Call- ? ?Call back number 03/13/2021  ?Post procedure Call Back phone  # [phone]  ?Permission to leave phone message Yes  ?Some recent data might be hidden  ?  ? ?Patient questions: ? ?Do you have a fever, pain , or abdominal swelling? No. ?Pain Score  0 * ? ?Have you tolerated food without any problems? Yes.   ? ?Have you been able to return to your normal activities? Yes.   ? ?Do you have any questions about your discharge instructions: ?Diet   No. ?Medications  No. ?Follow up visit  No. ? ?Do you have questions or concerns about your Care? No. ? ?Actions: ?* If pain score is 4 or above: ?No action needed, pain <4. ? ? ?

## 2021-03-24 ENCOUNTER — Telehealth: Payer: Self-pay

## 2021-03-24 NOTE — Telephone Encounter (Signed)
Pt is requesting a refill on: ?ketoconazole (NIZORAL) 2 % cream ? ?Pharmacy: ?[id number] [place] [address]? ?LOV 07/27/20 ? ?

## 2021-03-27 MED ORDER — KETOCONAZOLE 2 % EX CREA: 1.0000 | TOPICAL_CREAM | Freq: Every day | CUTANEOUS | 2 refills | Status: DC

## 2021-03-27 NOTE — Telephone Encounter (Signed)
Ok this is done 

## 2021-04-07 DIAGNOSIS — M19012 Primary osteoarthritis, left shoulder: Secondary | ICD-10-CM | POA: Diagnosis not present

## 2021-04-07 DIAGNOSIS — M19011 Primary osteoarthritis, right shoulder: Secondary | ICD-10-CM | POA: Diagnosis not present

## 2021-04-14 ENCOUNTER — Telehealth (INDEPENDENT_AMBULATORY_CARE_PROVIDER_SITE_OTHER): Payer: Medicare HMO | Admitting: Family Medicine

## 2021-04-14 ENCOUNTER — Encounter: Payer: Self-pay | Admitting: Family Medicine

## 2021-04-14 DIAGNOSIS — R6883 Chills (without fever): Secondary | ICD-10-CM | POA: Diagnosis not present

## 2021-04-14 DIAGNOSIS — R051 Acute cough: Secondary | ICD-10-CM

## 2021-04-14 DIAGNOSIS — U071 COVID-19: Secondary | ICD-10-CM

## 2021-04-14 DIAGNOSIS — J029 Acute pharyngitis, unspecified: Secondary | ICD-10-CM | POA: Diagnosis not present

## 2021-04-14 MED ORDER — NIRMATRELVIR/RITONAVIR (PAXLOVID)TABLET: 3.0000 | ORAL_TABLET | Freq: Two times a day (BID) | ORAL | 0 refills | Status: AC

## 2021-04-14 NOTE — Progress Notes (Signed)
? ? ?MyChart Video Visit ? ? ? ?Virtual Visit via Video Note  ? ?This visit type was conducted due to national recommendations for restrictions regarding the COVID-19 Pandemic (e.g. social distancing) in an effort to limit this patient's exposure and mitigate transmission in our community. This patient is at least at moderate risk for complications without adequate follow up. This format is felt to be most appropriate for this patient at this time. Physical exam was limited by quality of the video and audio technology used for the visit. CMA was able to get the patient set up on a video visit. ? ?Patient location: Home. Patient and provider in visit ?Provider location: Office ? ?I discussed the limitations of evaluation and management by telemedicine and the availability of in person appointments. The patient expressed understanding and agreed to proceed. ? ?Visit Date: 04/14/2021 ? ?Today's healthcare provider: Harland Dingwall, NP-C  ? ? ? ?Subjective:  ? ? Patient ID: Jason Robles, male    DOB: May 04, 1945, 76 y.o.   MRN: [id number] ? ?Chief Complaint  ?Patient presents with  ? Covid Positive  ?  Sx include: body ache, sore throat, runny nose starting 4/12 ?Covid Positive 4/14  ? ? ?HPI ? ?Complains of Covid infection. Positive covid test this morning. Symptoms started yesterday with chills, body aches, nasal congestion, rhinorrhea, sore throat, dry cough.  Starting to have some mild discomfort with breathing. ? ?He has not taken anything at this point.  ?Denies chest pain, palpitations, shortness of breath, abdominal pain, N/V/D.  ? ?Denies history of lung disease and does not smoke. ? ?He did get the Covid booster.  ? ? ? ?Past Medical History:  ?Diagnosis Date  ? ABDOMINAL PAIN OTHER SPECIFIED SITE 04/30/2008  ? ALLERGIC RHINITIS 08/25/2007  ? Anxiety 06/28/2010  ? BENIGN PROSTATIC HYPERTROPHY 08/21/2006  ? Chest pain 06/28/2010  ? COLONIC POLYPS, HX OF 04/18/2007  ? Complication of anesthesia   ? post op  nausea and vomiting and wakes up too early per pt  ? DEGENERATIVE JOINT DISEASE, KNEES, 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  ? Diverticulosis of colon (without mention of hemorrhage) 04/18/2007  ? EXTERNAL HEMORRHOIDS 04/18/2007  ? GERD (gastroesophageal reflux disease)   ? Headache(784.0) 09/20/2009  ? hx migraines  ? HEARING LOSS, 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  ? HYPERLIPIDEMIA 08/21/2006  ? INSOMNIA-SLEEP DISORDER-UNSPEC 09/20/2009  ? NONSPECIFIC ABN FINDING RAD & OTH EXAM GU ORGAN 05/03/2008  ? PERIPHERAL VASCULAR DISEASE 08/25/2007  ? PONV (postoperative nausea and vomiting)   ? Raynaud's syndrome 08/21/2006  ? ? ?Past Surgical History:  ?Procedure Laterality Date  ? APPENDECTOMY    ? COLONOSCOPY  07/25/2006  ? POLYPECTOMY    ? s/p knee surgery Bilateral   ? x's 4 since 1980  ? s/p left shoulder  2002  ? rotator cuff  ? s/p lumbar disc surgery  2007  ? 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  ? TOTAL KNEE ARTHROPLASTY Right 03/26/2016  ? Procedure: RIGHT TOTAL KNEE ARTHROPLASTY;  Surgeon: Vickey Huger, MD;  Location: [place];  Service: Orthopedics;  Laterality: Right;  ? 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  ? Procedure: TOTAL KNEE ARTHROPLASTY;  Surgeon: Vickey Huger, MD;  Location: [place];  Service: Orthopedics;  Laterality: Left;  ? UPPER GASTROINTESTINAL ENDOSCOPY    ? ? ?Family History  ?Problem Relation Age of Onset  ? Diabetes Mother   ? Alzheimer's disease Mother   ? Kidney cancer Brother   ? Diabetes Maternal Uncle   ? Colon cancer Neg Hx   ?  Colon polyps Neg Hx   ? Esophageal cancer Neg Hx   ? Rectal cancer Neg Hx   ? Stomach cancer Neg Hx   ? ? ?Social History  ? ?Socioeconomic History  ? Marital status: Married  ?  Spouse name: Not on file  ? Number of children: Not on file  ? Years of education: Not on file  ? Highest education level: Not on file  ?Occupational History  ? Occupation: self employed Tax inspector   ? Occupation: retired Higher education careers adviser  ?Tobacco Use  ? Smoking status: Former  ?  Packs/day: 1.00  ?  Years:  15.00  ?  Pack years: 15.00  ?  Types: Cigarettes  ?  Quit date: 01/31/1980  ?  Years since quitting: 41.2  ?  Passive exposure: Never  ? Smokeless tobacco: Never  ?Vaping Use  ? Vaping Use: Never used  ?Substance and Sexual Activity  ? Alcohol use: Yes  ?  Alcohol/week: 4.0 standard drinks  ?  Types: 4 Glasses of wine per week  ?  Comment: ONCE A WEEK  ? Drug use: No  ? Sexual activity: Yes  ?Other Topics Concern  ? Not on file  ?Social History Narrative  ? 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 ?Social Determinants of Health  ? ?Financial Resource Strain: Not on file  ?Food Insecurity: Not on file  ?Transportation Needs: Not on file  ?Physical Activity: Not on file  ?Stress: Not on file  ?Social Connections: Not on file  ?Intimate Partner Violence: Not on file  ? ? ?Outpatient Medications Prior to Visit  ?Medication Sig Dispense Refill  ? aspirin 81 MG EC tablet Take 1 tablet (81 mg total) by mouth daily. Swallow whole. 30 tablet 12  ? atorvastatin (LIPITOR) 20 MG tablet Take 1 tablet (20 mg total) by mouth daily. 90 tablet 3  ? diphenhydrAMINE (BENADRYL) 50 MG tablet Take 1 tablet by mouth 1 hour prior to Cardiac CT (Patient not taking: Reported on 07/27/2020) 1 tablet 0  ? Doxepin HCl 3 MG TABS 1 tab by mouth at bedtime as needed (Patient not taking: Reported on 03/13/2021) 90 tablet 1  ? eszopiclone (LUNESTA) 2 MG TABS tablet Take 1 tablet (2 mg total) by mouth at bedtime as needed for sleep. Take immediately before bedtime (Patient not taking: Reported on 02/27/2021) 90 tablet 1  ? ketoconazole (NIZORAL) 2 % cream Apply 1 application. topically daily. 30 g 2  ? meloxicam (MOBIC) 15 MG tablet TAKE 1 TABLET BY MOUTH EVERY DAY 90 tablet 3  ? metoprolol tartrate (LOPRESSOR) 25 MG tablet Take 1 tablet by mouth 2 hours prior to Cardiac CT (Patient not taking: Reported on 02/27/2021) 1 tablet 0  ? nitroGLYCERIN (NITROSTAT) 0.4 MG SL tablet Place 1 tablet (0.4 mg total) under the tongue every 5 (five) minutes as needed for  chest pain. (Patient not taking: Reported on 02/27/2021) 25 tablet 3  ? pantoprazole (PROTONIX) 40 MG tablet TAKE 1 TABLET BY MOUTH EVERY DAY 90 tablet 3  ? traMADol (ULTRAM) 50 MG tablet Take 1 tablet (50 mg total) by mouth every 12 (twelve) hours as needed. (Patient not taking: Reported on 02/27/2021) 180 tablet 0  ? traZODone (DESYREL) 50 MG tablet TAKE 0.5-1 TABLETS BY MOUTH AT BEDTIME AS NEEDED FOR SLEEP. 90 tablet 1  ? triamcinolone (NASACORT) 55 MCG/ACT AERO nasal inhaler Place 2 sprays into the nose daily. (Patient not taking: Reported on 02/27/2021) 1 Inhaler 12  ? triamcinolone cream (KENALOG) 0.1 % Apply  1 application topically 2 (two) times daily. (Patient not taking: Reported on 07/27/2020) 100 g 0  ? predniSONE (DELTASONE) 10 MG tablet 3 tabs by mouth per day for 3 days, 2tabs per day for 3 days,1tab per day for 3 days (Patient not taking: Reported on 07/27/2020) 18 tablet 0  ? ?Facility-Administered Medications Prior to Visit  ?Medication Dose Route Frequency Provider Last Rate Last Admin  ? 0.9 %  sodium chloride infusion  500 mL Intravenous Once Armbruster, Carlota Raspberry, MD      ? ? ?Allergies  ?Allergen Reactions  ? Iohexol Itching  ?  Itching post 125cc Omni injection. Treated w/ 50 mg PO benedryl.  Onset Date: [id number] ?  ? ? ?ROS ?Pertinent positives and negatives in the history of present illness. ? ?   ?Objective:  ?  ?Physical Exam ? ?There were no vitals taken for this visit. ?Wt Readings from Last 3 Encounters:  ?03/13/21 174 lb (78.9 kg)  ?02/27/21 174 lb (78.9 kg)  ?07/27/20 171 lb (77.6 kg)  ? ?Alert and oriented and in no acute distress.  Respirations unlabored.  Speaking in complete sentences without difficulty. ?   ?Assessment & Plan:  ? ?Problem List Items Addressed This Visit   ?None ?Visit Diagnoses   ? ? COVID-19 virus infection    -  Primary  ? Relevant Medications  ? nirmatrelvir/ritonavir EUA (PAXLOVID) 20 x 150 MG & 10 x '100MG'$  TABS  ? Chills      ? Acute cough      ? Acute pharyngitis,  unspecified etiology      ? ?  ? ?Discussed symptomatic management. Counseling done on Paxlovid, how to take the medication as well as potential side effects. Counseling on CDC guidelines for quarantine a

## 2021-05-31 DIAGNOSIS — W57XXXA Bitten or stung by nonvenomous insect and other nonvenomous arthropods, initial encounter: Secondary | ICD-10-CM | POA: Diagnosis not present

## 2021-05-31 DIAGNOSIS — S50861A Insect bite (nonvenomous) of right forearm, initial encounter: Secondary | ICD-10-CM | POA: Diagnosis not present

## 2021-06-05 ENCOUNTER — Ambulatory Visit (INDEPENDENT_AMBULATORY_CARE_PROVIDER_SITE_OTHER): Payer: Medicare HMO

## 2021-06-05 DIAGNOSIS — Z0000 Encounter for general adult medical examination without abnormal findings: Secondary | ICD-10-CM | POA: Diagnosis not present

## 2021-06-05 NOTE — Progress Notes (Signed)
Subjective:   Jason Robles is a 76 y.o. male who presents for an Subsequent Medicare Annual Wellness Visit.   I connected with Loyalty Claar  today by telephone and verified that I am speaking with the correct person using two identifiers. Location patient: home Location provider: work Persons participating in the virtual visit: patient, provider.   I discussed the limitations, risks, security and privacy concerns of performing an evaluation and management service by telephone and the availability of in person appointments. I also discussed with the patient that there may be a patient responsible charge related to this service. The patient expressed understanding and verbally consented to this telephonic visit.    Interactive audio and video telecommunications were attempted between this provider and patient, however failed, due to patient having technical difficulties OR patient did not have access to video capability.  We continued and completed visit with audio only.    Review of Systems     Cardiac Risk Factors include: advanced age (>66mn, >>64women);male gender     Objective:    Today's Vitals   There is no height or weight on file to calculate BMI.     06/05/2021    9:22 AM 03/14/2016    9:30 AM 02/06/2016    1:59 PM 01/31/2016    2:15 PM 05/02/2012    3:02 PM  Advanced Directives  Does Patient Have a Medical Advance Directive? Yes Yes Yes Yes Patient has advance directive, copy not in chart  Type of Advance Directive [place] will [place] will [place] will Living will;Healthcare Power of Attorney Living will  Does patient want to make changes to medical advance directive?  No - Patient declined No - Patient declined    Copy of HCloverin Chart? No - copy requested No - copy requested Yes No - copy requested Copy requested from family  Pre-existing out of facility  DNR order (yellow form or pink MOST form)     No    Current Medications (verified) Outpatient Encounter Medications as of 06/05/2021  Medication Sig   aspirin 81 MG EC tablet Take 1 tablet (81 mg total) by mouth daily. Swallow whole.   atorvastatin (LIPITOR) 20 MG tablet Take 1 tablet (20 mg total) by mouth daily.   diphenhydrAMINE (BENADRYL) 50 MG tablet Take 1 tablet by mouth 1 hour prior to Cardiac CT   Doxepin HCl 3 MG TABS 1 tab by mouth at bedtime as needed   eszopiclone (LUNESTA) 2 MG TABS tablet Take 1 tablet (2 mg total) by mouth at bedtime as needed for sleep. Take immediately before bedtime   ketoconazole (NIZORAL) 2 % cream Apply 1 application. topically daily.   meloxicam (MOBIC) 15 MG tablet TAKE 1 TABLET BY MOUTH EVERY DAY   pantoprazole (PROTONIX) 40 MG tablet TAKE 1 TABLET BY MOUTH EVERY DAY   traMADol (ULTRAM) 50 MG tablet Take 1 tablet (50 mg total) by mouth every 12 (twelve) hours as needed.   traZODone (DESYREL) 50 MG tablet TAKE 0.5-1 TABLETS BY MOUTH AT BEDTIME AS NEEDED FOR SLEEP.   triamcinolone cream (KENALOG) 0.1 % Apply 1 application topically 2 (two) times daily.   metoprolol tartrate (LOPRESSOR) 25 MG tablet Take 1 tablet by mouth 2 hours prior to Cardiac CT   nitroGLYCERIN (NITROSTAT) 0.4 MG SL tablet Place 1 tablet (0.4 mg total) under the tongue every 5 (five) minutes as needed for chest pain.   triamcinolone (  NASACORT) 55 MCG/ACT AERO nasal inhaler Place 2 sprays into the nose daily.   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   Diverticulosis of colon (without mention of hemorrhage) 04/18/2007   EXTERNAL HEMORRHOIDS 04/18/2007   GERD (gastroesophageal reflux disease)    Headache(784.0) 09/20/2009   hx migraines   HEARING LOSS, 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   HYPERLIPIDEMIA 08/21/2006   INSOMNIA-SLEEP DISORDER-UNSPEC 09/20/2009   NONSPECIFIC ABN FINDING RAD & OTH EXAM GU ORGAN 05/03/2008   PERIPHERAL VASCULAR DISEASE 08/25/2007   PONV (postoperative nausea and vomiting)    Raynaud's syndrome 08/21/2006   Past Surgical History:  Procedure Laterality Date   APPENDECTOMY     COLONOSCOPY  07/25/2006   POLYPECTOMY     s/p knee surgery Bilateral    x's 4 since 1980   s/p left shoulder  2002   rotator cuff   s/p lumbar disc surgery  2007   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   TOTAL KNEE ARTHROPLASTY Right 03/26/2016   Procedure: RIGHT TOTAL KNEE ARTHROPLASTY;  Surgeon: Vickey Huger, MD;  Location: [facility];  Service: Orthopedics;  Laterality: Right;   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   Procedure: TOTAL KNEE ARTHROPLASTY;  Surgeon: Vickey Huger, MD;  Location: [facility];  Service: Orthopedics;  Laterality: Left;   UPPER GASTROINTESTINAL ENDOSCOPY     Family History  Problem Relation Age of Onset   Diabetes Mother    Alzheimer's disease Mother    Kidney cancer Brother    Diabetes Maternal Uncle    Colon cancer Neg Hx    Colon polyps Neg Hx    Esophageal cancer Neg Hx    Rectal cancer Neg Hx    Stomach cancer Neg Hx    Social History   Socioeconomic History   Marital status: Married    Spouse name: Not on file   Number of children: Not on file   Years of education: Not on file   Highest education level: Not on file  Occupational History   Occupation: self employed retail picture framing    Occupation: retired Higher education careers adviser  Tobacco Use   Smoking status: Former    Packs/day: 1.00    Years: 15.00    Pack years: 15.00    Types:  Cigarettes    Quit date: 01/31/1980    Years since quitting: 41.3    Passive exposure: Never   Smokeless tobacco: Never  Vaping Use   Vaping Use: Never used  Substance and Sexual Activity   Alcohol use: Yes    Alcohol/week: 4.0 standard drinks    Types: 4 Glasses of wine per week    Comment: ONCE A WEEK   Drug use: No   Sexual activity: Yes  Other Topics Concern   Not on file  Social History Narrative   [place]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   Social Determinants of Health   Financial Resource Strain: Low Risk    Difficulty of Paying Living Expenses: Not hard at all  Food Insecurity: No Food Insecurity  Worried About Charity fundraiser in the Last Year: Never true   [place] in the Last Year: Never true  Transportation Needs: No Transportation Needs   Lack of Transportation (Medical): No   Lack of Transportation (Non-Medical): No  Physical Activity: Insufficiently Active   Days of Exercise per Week: 3 days   Minutes of Exercise per Session: 30 min  Stress: No Stress Concern Present   Feeling of Stress : Not at all  Social Connections: Moderately Isolated   Frequency of Communication with Friends and Family: Twice a week   Frequency of Social Gatherings with Friends and Family: Twice a week   Attends Religious Services: Never   Printmaker: No   Attends Music therapist: Never   Marital Status: Married    Tobacco Counseling Counseling given: Not Answered   Clinical Intake:  Pre-visit preparation completed: Yes  Pain : No/denies pain     Nutritional Risks: None Diabetes: No  How often do you need to have someone help you when you read instructions, pamphlets, or other written materials from your doctor or pharmacy?: 1 - Never What is the last grade level you completed in school?: BS  Diabetic?no   Interpreter Needed?: No  Information entered by :: [place] [facility] Daily Living    06/05/2021    9:26  AM  In your present state of health, do you have any difficulty performing the following activities:  Hearing? 0  Vision? 0  Difficulty concentrating or making decisions? 0  Walking or climbing stairs? 0  Dressing or bathing? 0  Doing errands, shopping? 0  Preparing Food and eating ? N  Using the Toilet? N  In the past six months, have you accidently leaked urine? N  Do you have problems with loss of bowel control? N  Managing your Medications? N  Managing your Finances? N  Housekeeping or managing your Housekeeping? N    Patient Care Team: Biagio Borg, MD as PCP - General  Indicate any recent Medical Services you may have received from other than [facility] providers in the past year (date may be approximate).     Assessment:   This is a routine wellness examination for Jason Robles.  Hearing/Vision screen Vision Screening - Comments:: Annual eye exams   Dietary issues and exercise activities discussed: Current Exercise Habits: Home exercise routine, Type of exercise: walking, Time (Minutes): 30, Frequency (Times/Week): 3, Weekly Exercise (Minutes/Week): 90, Intensity: Mild, Exercise limited by: None identified   Goals Addressed   None    Depression Screen    06/05/2021    9:23 AM 06/05/2021    9:21 AM 07/27/2020   10:44 AM 11/23/2019    3:01 PM 08/05/2019    9:26 AM 08/05/2019    8:55 AM 07/31/2018    3:37 PM  PHQ 2/9 Scores  PHQ - 2 Score 0 0 0 0 0 0 0    Fall Risk    06/05/2021    9:23 AM 07/27/2020   10:44 AM 07/27/2020    9:47 AM 11/23/2019    3:01 PM 08/05/2019    9:26 AM  [place] in the past year? 0 0 0 0 0  Number falls in past yr: 0 0 0 0   Injury with Fall? 0 0 0 0   Risk for fall due to :    No Fall Risks   Follow up Falls evaluation completed  Falls evaluation completed     FALL RISK PREVENTION PERTAINING TO THE HOME:  Any stairs in or around the home? Yes  If so, are there any without handrails? No  Home free of loose throw rugs in walkways, pet  beds, electrical cords, etc? Yes  Adequate lighting in your home to reduce risk of falls? Yes   ASSISTIVE DEVICES UTILIZED TO PREVENT FALLS:  Life alert? No  Use of a cane, walker or w/c? No  Grab bars in the bathroom? Yes  Shower chair or bench in shower? Yes  Elevated toilet seat or a handicapped toilet? Yes     Cognitive Function:    Normal cognitive status assessed by telephone conversation  by this Nurse Health Advisor. No abnormalities found.      Immunizations Immunization History  Administered Date(s) Administered   Fluad Quad(high Dose 65+) 09/09/2019   Influenza, High Dose Seasonal PF 09/12/2016   Influenza, Quadrivalent, Recombinant, Inj, Pf 11/01/2018   Influenza-Unspecified 11/06/2015   PFIZER Comirnaty(Gray Top)Covid-19 Tri-Sucrose Vaccine 05/18/2020   PFIZER(Purple Top)SARS-COV-2 Vaccination 02/22/2019, 03/18/2019, 10/01/2019, 05/18/2020   Pneumococcal Conjugate-13 07/31/2018   Pneumococcal Polysaccharide-23 11/06/2015   Pneumococcal-Unspecified 11/06/2015   Tdap 09/28/2010   Zoster, Live 09/28/2010    TDAP status: Due, Education has been provided regarding the importance of this vaccine. Advised may receive this vaccine at local pharmacy or Health Dept. Aware to provide a copy of the vaccination record if obtained from local pharmacy or Health Dept. Verbalized acceptance and understanding.  Flu Vaccine status: Declined, Education has been provided regarding the importance of this vaccine but patient still declined. Advised may receive this vaccine at local pharmacy or Health Dept. Aware to provide a copy of the vaccination record if obtained from local pharmacy or Health Dept. Verbalized acceptance and understanding.  Pneumococcal vaccine status: Up to date  Covid-19 vaccine status: Completed vaccines  Qualifies for Shingles Vaccine? Yes   Zostavax completed No   Shingrix Completed?: No.    Education has been provided regarding the importance of this  vaccine. Patient has been advised to call insurance company to determine out of pocket expense if they have not yet received this vaccine. Advised may also receive vaccine at local pharmacy or Health Dept. Verbalized acceptance and understanding.  Screening Tests Health Maintenance  Topic Date Due   Zoster Vaccines- Shingrix (1 of 2) Never done   COVID-19 Vaccine (5 - Booster for Pfizer series) 07/13/2020   TETANUS/TDAP  09/27/2020   INFLUENZA VACCINE  08/01/2021   COLONOSCOPY (Pts 45-46yr Insurance coverage will need to be confirmed)  03/14/2026   Pneumonia Vaccine 76 Years old  Completed   Hepatitis C Screening  Completed   HPV VACCINES  Aged Out    Health Maintenance  Health Maintenance Due  Topic Date Due   Zoster Vaccines- Shingrix (1 of 2) Never done   COVID-19 Vaccine (5 - Booster for Pfizer series) 07/13/2020   TETANUS/TDAP  09/27/2020    Colorectal cancer screening: Type of screening: Colonoscopy. Completed 03/13/2021. Repeat every 5 years  Lung Cancer Screening: (Low Dose CT Chest recommended if Age 74-80years, 30 pack-year currently smoking OR have quit w/in 15years.) does not qualify.   Lung Cancer Screening Referral: n/a  Additional Screening:  Hepatitis C Screening: does not qualify; Completed 09/12/2016  Vision Screening: Recommended annual ophthalmology exams for early detection of glaucoma and other disorders of the eye. Is the patient up to date with their annual eye exam?  Yes  Who is the  provider or what is the [facility] of the [facility] which the [facility]  If pt is not established with a provider, would they like to be referred to a provider to establish care? No .   Dental Screening: Recommended annual dental exams for proper oral hygiene  Community Resource Referral / Chronic Care Management: CRR required this visit?  No   CCM required this visit?  No      Plan:     I have personally reviewed and noted the following  in the patient's chart:   Medical and social history Use of alcohol, tobacco or illicit drugs  Current medications and supplements including opioid prescriptions. Patient is not currently taking opioid prescriptions. Functional ability and status Nutritional status Physical activity Advanced directives List of other physicians Hospitalizations, surgeries, and ER visits in previous 12 months Vitals Screenings to include cognitive, depression, and falls Referrals and appointments  In addition, I have reviewed and discussed with patient certain preventive protocols, quality metrics, and best practice recommendations. A written personalized care plan for preventive services as well as general preventive health recommendations were provided to patient.     Randel Pigg, LPN   0/06/2692   Nurse Notes: none

## 2021-06-05 NOTE — Patient Instructions (Signed)
Jason Robles , Thank you for taking time to come for your Medicare Wellness Visit. I appreciate your ongoing commitment to your health goals. Please review the following plan we discussed and let me know if I can assist you in the future.   Screening recommendations/referrals: Colonoscopy: 03/13/2021 Recommended yearly ophthalmology/optometry visit for glaucoma screening and checkup Recommended yearly dental visit for hygiene and checkup  Vaccinations: Influenza vaccine:due  Pneumococcal vaccine: completed  Tdap vaccine: due  Shingles vaccine: will consider     Advanced directives: yes   Conditions/risks identified: none   Next appointment: none   Preventive Care 74 Years and Older, Male Preventive care refers to lifestyle choices and visits with your health care provider that can promote health and wellness. What does preventive care include? A yearly physical exam. This is also called an annual well check. Dental exams once or twice a year. Routine eye exams. Ask your health care provider how often you should have your eyes checked. Personal lifestyle choices, including: Daily care of your teeth and gums. Regular physical activity. Eating a healthy diet. Avoiding tobacco and drug use. Limiting alcohol use. Practicing safe sex. Taking low doses of aspirin every day. Taking vitamin and mineral supplements as recommended by your health care provider. What happens during an annual well check? The services and screenings done by your health care provider during your annual well check will depend on your age, overall health, lifestyle risk factors, and family history of disease. Counseling  Your health care provider may ask you questions about your: Alcohol use. Tobacco use. Drug use. Emotional well-being. Home and relationship well-being. Sexual activity. Eating habits. History of falls. Memory and ability to understand (cognition). Work and work Statistician. Screening   You may have the following tests or measurements: Height, weight, and BMI. Blood pressure. Lipid and cholesterol levels. These may be checked every 5 years, or more frequently if you are over 76 years old. Skin check. Lung cancer screening. You may have this screening every year starting at age 43 if you have a 30-pack-year history of smoking and currently smoke or have quit within the past 15 years. Fecal occult blood test (FOBT) of the stool. You may have this test every year starting at age 9. Flexible sigmoidoscopy or colonoscopy. You may have a sigmoidoscopy every 5 years or a colonoscopy every 10 years starting at age 1. Prostate cancer screening. Recommendations will vary depending on your family history and other risks. Hepatitis C blood test. Hepatitis B blood test. Sexually transmitted disease (STD) testing. Diabetes screening. This is done by checking your blood sugar (glucose) after you have not eaten for a while (fasting). You may have this done every 1-3 years. Abdominal aortic aneurysm (AAA) screening. You may need this if you are a current or former smoker. Osteoporosis. You may be screened starting at age 71 if you are at high risk. Talk with your health care provider about your test results, treatment options, and if necessary, the need for more tests. Vaccines  Your health care provider may recommend certain vaccines, such as: Influenza vaccine. This is recommended every year. Tetanus, diphtheria, and acellular pertussis (Tdap, Td) vaccine. You may need a Td booster every 10 years. Zoster vaccine. You may need this after age 38. Pneumococcal 13-valent conjugate (PCV13) vaccine. One dose is recommended after age 51. Pneumococcal polysaccharide (PPSV23) vaccine. One dose is recommended after age 65. Talk to your health care provider about which screenings and vaccines you need and how often you  need them. This information is not intended to replace advice given to you by  your health care provider. Make sure you discuss any questions you have with your health care provider. Document Released: 01/14/2015 Document Revised: 09/07/2015 Document Reviewed: 10/19/2014 Elsevier Interactive Patient Education  2017 [place] Prevention in the Home Falls can cause injuries. They can happen to people of all ages. There are many things you can do to make your home safe and to help prevent falls. What can I do on the outside of my home? Regularly fix the edges of walkways and driveways and fix any cracks. Remove anything that might make you trip as you walk through a door, such as a raised step or threshold. Trim any bushes or trees on the path to your home. Use bright outdoor lighting. Clear any walking paths of anything that might make someone trip, such as rocks or tools. Regularly check to see if handrails are loose or broken. Make sure that both sides of any steps have handrails. Any raised decks and porches should have guardrails on the edges. Have any leaves, snow, or ice cleared regularly. Use sand or salt on walking paths during winter. Clean up any spills in your garage right away. This includes oil or grease spills. What can I do in the bathroom? Use night lights. Install grab bars by the toilet and in the tub and shower. Do not use towel bars as grab bars. Use non-skid mats or decals in the tub or shower. If you need to sit down in the shower, use a plastic, non-slip stool. Keep the floor dry. Clean up any water that spills on the floor as soon as it happens. Remove soap buildup in the tub or shower regularly. Attach bath mats securely with double-sided non-slip rug tape. Do not have throw rugs and other things on the floor that can make you trip. What can I do in the bedroom? Use night lights. Make sure that you have a light by your bed that is easy to reach. Do not use any sheets or blankets that are too big for your bed. They should not hang  down onto the floor. Have a firm chair that has side arms. You can use this for support while you get dressed. Do not have throw rugs and other things on the floor that can make you trip. What can I do in the kitchen? Clean up any spills right away. Avoid walking on wet floors. Keep items that you use a lot in easy-to-reach places. If you need to reach something above you, use a strong step stool that has a grab bar. Keep electrical cords out of the way. Do not use floor polish or wax that makes floors slippery. If you must use wax, use non-skid floor wax. Do not have throw rugs and other things on the floor that can make you trip. What can I do with my stairs? Do not leave any items on the stairs. Make sure that there are handrails on both sides of the stairs and use them. Fix handrails that are broken or loose. Make sure that handrails are as long as the stairways. Check any carpeting to make sure that it is firmly attached to the stairs. Fix any carpet that is loose or worn. Avoid having throw rugs at the top or bottom of the stairs. If you do have throw rugs, attach them to the floor with carpet tape. Make sure that you have a light switch  at the top of the stairs and the bottom of the stairs. If you do not have them, ask someone to add them for you. What else can I do to help prevent falls? Wear shoes that: Do not have high heels. Have rubber bottoms. Are comfortable and fit you well. Are closed at the toe. Do not wear sandals. If you use a stepladder: Make sure that it is fully opened. Do not climb a closed stepladder. Make sure that both sides of the stepladder are locked into place. Ask someone to hold it for you, if possible. Clearly mark and make sure that you can see: Any grab bars or handrails. First and last steps. Where the edge of each step is. Use tools that help you move around (mobility aids) if they are needed. These  include: Canes. Walkers. Scooters. Crutches. Turn on the lights when you go into a dark area. Replace any light bulbs as soon as they burn out. Set up your furniture so you have a clear path. Avoid moving your furniture around. If any of your floors are uneven, fix them. If there are any pets around you, be aware of where they are. Review your medicines with your doctor. Some medicines can make you feel dizzy. This can increase your chance of falling. Ask your doctor what other things that you can do to help prevent falls. This information is not intended to replace advice given to you by your health care provider. Make sure you discuss any questions you have with your health care provider. Document Released: 10/14/2008 Document Revised: 05/26/2015 Document Reviewed: 01/22/2014 Elsevier Interactive Patient Education  2017 [employer].

## 2021-06-06 ENCOUNTER — Encounter: Payer: Self-pay | Admitting: Internal Medicine

## 2021-06-06 ENCOUNTER — Ambulatory Visit (INDEPENDENT_AMBULATORY_CARE_PROVIDER_SITE_OTHER): Payer: Medicare HMO | Admitting: Internal Medicine

## 2021-06-06 VITALS — BP 126/66 | HR 64 | Temp 98.6°F | Ht 71.0 in | Wt 174.0 lb

## 2021-06-06 DIAGNOSIS — G4700 Insomnia, unspecified: Secondary | ICD-10-CM | POA: Diagnosis not present

## 2021-06-06 DIAGNOSIS — H2513 Age-related nuclear cataract, bilateral: Secondary | ICD-10-CM

## 2021-06-06 DIAGNOSIS — H538 Other visual disturbances: Secondary | ICD-10-CM

## 2021-06-06 DIAGNOSIS — E559 Vitamin D deficiency, unspecified: Secondary | ICD-10-CM | POA: Diagnosis not present

## 2021-06-06 DIAGNOSIS — D3132 Benign neoplasm of left choroid: Secondary | ICD-10-CM

## 2021-06-06 DIAGNOSIS — Z125 Encounter for screening for malignant neoplasm of prostate: Secondary | ICD-10-CM

## 2021-06-06 DIAGNOSIS — E785 Hyperlipidemia, unspecified: Secondary | ICD-10-CM | POA: Insufficient documentation

## 2021-06-06 DIAGNOSIS — R519 Headache, unspecified: Secondary | ICD-10-CM | POA: Diagnosis not present

## 2021-06-06 DIAGNOSIS — E538 Deficiency of other specified B group vitamins: Secondary | ICD-10-CM

## 2021-06-06 DIAGNOSIS — R739 Hyperglycemia, unspecified: Secondary | ICD-10-CM

## 2021-06-06 DIAGNOSIS — Z0001 Encounter for general adult medical examination with abnormal findings: Secondary | ICD-10-CM | POA: Diagnosis not present

## 2021-06-06 DIAGNOSIS — E7800 Pure hypercholesterolemia, unspecified: Secondary | ICD-10-CM | POA: Diagnosis not present

## 2021-06-06 DIAGNOSIS — H259 Unspecified age-related cataract: Secondary | ICD-10-CM | POA: Insufficient documentation

## 2021-06-06 HISTORY — DX: Age-related nuclear cataract, bilateral: H25.13

## 2021-06-06 HISTORY — DX: Benign neoplasm of left choroid: D31.32

## 2021-06-06 LAB — URINALYSIS, ROUTINE W REFLEX MICROSCOPIC
Bilirubin Urine: NEGATIVE
Hgb urine dipstick: NEGATIVE
Ketones, ur: NEGATIVE
Leukocytes,Ua: NEGATIVE
Nitrite: NEGATIVE
RBC / HPF: NONE SEEN (ref 0–?)
Specific Gravity, Urine: <=1.005 — AB (ref 1.000–1.030)
Total Protein, Urine: NEGATIVE
Urine Glucose: NEGATIVE
Urobilinogen, UA: 0.2 (ref 0.0–1.0)
pH: 6 (ref 5.0–8.0)

## 2021-06-06 LAB — PSA: PSA: 2.5 ng/mL (ref 0.10–4.00)

## 2021-06-06 LAB — CBC WITH DIFFERENTIAL/PLATELET
Basophils Absolute: 0 10*3/uL (ref 0.0–0.1)
Basophils Relative: 0.4 % (ref 0.0–3.0)
Eosinophils Absolute: 0.1 10*3/uL (ref 0.0–0.7)
Eosinophils Relative: 1.3 % (ref 0.0–5.0)
HCT: 40.9 % (ref 39.0–52.0)
Hemoglobin: 13.7 g/dL (ref 13.0–17.0)
Lymphocytes Relative: 25.4 % (ref 12.0–46.0)
Lymphs Abs: 1.5 10*3/uL (ref 0.7–4.0)
MCHC: 33.5 g/dL (ref 30.0–36.0)
MCV: 87.6 fl (ref 78.0–100.0)
Monocytes Absolute: 0.4 10*3/uL (ref 0.1–1.0)
Monocytes Relative: 7.4 % (ref 3.0–12.0)
Neutro Abs: 4 10*3/uL (ref 1.4–7.7)
Neutrophils Relative %: 65.5 % (ref 43.0–77.0)
Platelets: 168 10*3/uL (ref 150.0–400.0)
RBC: 4.66 Mil/uL (ref 4.22–5.81)
RDW: 15.2 % (ref 11.5–15.5)
WBC: 6.1 10*3/uL (ref 4.0–10.5)

## 2021-06-06 LAB — HEPATIC FUNCTION PANEL
ALT: 12 U/L (ref 0–53)
AST: 14 U/L (ref 0–37)
Albumin: 4.3 g/dL (ref 3.5–5.2)
Alkaline Phosphatase: 59 U/L (ref 39–117)
Bilirubin, Direct: 0.1 mg/dL (ref 0.0–0.3)
Total Bilirubin: 0.8 mg/dL (ref 0.2–1.2)
Total Protein: 6.8 g/dL (ref 6.0–8.3)

## 2021-06-06 LAB — BASIC METABOLIC PANEL
BUN: 18 mg/dL (ref 6–23)
CO2: 30 mEq/L (ref 19–32)
Calcium: 9.7 mg/dL (ref 8.4–10.5)
Chloride: 100 mEq/L (ref 96–112)
Creatinine, Ser: 0.93 mg/dL (ref 0.40–1.50)
GFR: 81.16 mL/min (ref >60.00)
Glucose, Bld: 87 mg/dL (ref 70–99)
Potassium: 4.3 mEq/L (ref 3.5–5.1)
Sodium: 137 mEq/L (ref 135–145)

## 2021-06-06 LAB — VITAMIN D 25 HYDROXY (VIT D DEFICIENCY, FRACTURES): VITD: 29.17 ng/mL — ABNORMAL LOW (ref 30.00–100.00)

## 2021-06-06 LAB — VITAMIN B12: Vitamin B-12: 177 pg/mL — ABNORMAL LOW (ref 211–911)

## 2021-06-06 LAB — LIPID PANEL
Cholesterol: 175 mg/dL (ref 0–200)
HDL: 58.6 mg/dL (ref >39.00)
LDL Cholesterol: 94 mg/dL (ref 0–99)
NonHDL: 116.72
Total CHOL/HDL Ratio: 3
Triglycerides: 114 mg/dL (ref 0.0–149.0)
VLDL: 22.8 mg/dL (ref 0.0–40.0)

## 2021-06-06 LAB — TSH: TSH: 1.56 u[IU]/mL (ref 0.35–5.50)

## 2021-06-06 LAB — HEMOGLOBIN A1C: Hgb A1c MFr Bld: 6.1 % (ref 4.6–6.5)

## 2021-06-06 MED ORDER — BELSOMRA 15 MG PO TABS: 15.0000 mg | ORAL_TABLET | Freq: Every evening | ORAL | 5 refills | Status: DC | PRN

## 2021-06-06 NOTE — Patient Instructions (Addendum)
Please consider the Shingrix shots and the Tdap at the [facility]  You will be contacted regarding the referral for: MRI for the brain  Please also see your eye doctor asap for the blurred vision  Ok to change the sleeping medication to Belsomra 15 mg at bedtime as needed  Please continue all other medications as before, although we may need to increase the lipitor as we mentioned  Please have the pharmacy call with any other refills you may need.  Please continue your efforts at being more active, low cholesterol diet, and weight control.  You are otherwise up to date with prevention measures today.  Please keep your appointments with your specialists as you may have planned  Please go to the LAB at the blood drawing area for the tests to be done  You will be contacted by phone if any changes need to be made immediately.  Otherwise, you will receive a letter about your results with an explanation, but please check with MyChart first.  Please remember to sign up for MyChart if you have not done so, as this will be important to you in the future with finding out test results, communicating by private email, and scheduling acute appointments online when needed.  Please make an Appointment to return in 6 months, or sooner if needed

## 2021-06-06 NOTE — Progress Notes (Unsigned)
Patient ID: Jason Robles, male   DOB: 31-Mar-1945, 76 y.o.   MRN: [id number]         Chief Complaint:: wellness exam and Annual Exam (Patient c/o having pressure/pain in left side of head)  , hld, insomnia, blurred vision       HPI:  Jason Robles is a 76 y.o. male here for wellness exam; plans to have shingrix and tdap at the local pharmacy, declines covid booster, o/w up to date                        Also c/o unusual new onset left sided HA with mild blurred vision for the past 1 week, Pt denies new neurological symptoms such as facial or extremity weakness or numbness  .  Has a sense of pressure pain worse at left forehead without rash, swelling or sinus symptoms.  Nothing else makes better or worse.  Trazodone makes him feel "bad" and asks for change.  Pt denies chest pain, increased sob or doe, wheezing, orthopnea, PND, increased LE swelling, palpitations, dizziness or syncope.   Pt denies polydipsia, polyuria  Pt denies fever, wt loss, night sweats, loss of appetite, or other constitutional symptoms   Wt Readings from Last 3 Encounters:  06/06/21 174 lb (78.9 kg)  03/13/21 174 lb (78.9 kg)  02/27/21 174 lb (78.9 kg)   BP Readings from Last 3 Encounters:  06/06/21 126/66  03/13/21 110/76  07/27/20 132/82   Immunization History  Administered Date(s) Administered   Fluad Quad(high Dose 65+) 09/09/2019   Influenza, High Dose Seasonal PF 09/12/2016   Influenza, Quadrivalent, Recombinant, Inj, Pf 11/01/2018   Influenza-Unspecified 11/06/2015   PFIZER Comirnaty(Gray Top)Covid-19 Tri-Sucrose Vaccine 05/18/2020   PFIZER(Purple Top)SARS-COV-2 Vaccination 02/22/2019, 03/18/2019, 10/01/2019, 05/18/2020   Pneumococcal Conjugate-13 07/31/2018   Pneumococcal Polysaccharide-23 11/06/2015   Pneumococcal-Unspecified 11/06/2015   Tdap 09/28/2010   Zoster, Live 09/28/2010   Health Maintenance Due  Topic Date Due   Zoster Vaccines- Shingrix (1 of 2) Never done   TETANUS/TDAP   09/27/2020      Past Medical History:  Diagnosis Date   ABDOMINAL PAIN OTHER SPECIFIED SITE 04/30/2008   ALLERGIC RHINITIS 08/25/2007   Anxiety 06/28/2010   BENIGN PROSTATIC HYPERTROPHY 08/21/2006   Chest pain 06/28/2010   COLONIC POLYPS, HX OF 94/50/3888   Complication of anesthesia    post op nausea and vomiting and wakes up too early per pt   DEGENERATIVE JOINT DISEASE, KNEES, 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   Diverticulosis of colon (without mention of hemorrhage) 04/18/2007   EXTERNAL HEMORRHOIDS 04/18/2007   GERD (gastroesophageal reflux disease)    Headache(784.0) 09/20/2009   hx migraines   HEARING LOSS, 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   HYPERLIPIDEMIA 08/21/2006   INSOMNIA-SLEEP DISORDER-UNSPEC 09/20/2009   NONSPECIFIC ABN FINDING RAD & OTH EXAM GU ORGAN 05/03/2008   PERIPHERAL VASCULAR DISEASE 08/25/2007   PONV (postoperative nausea and vomiting)    Raynaud's syndrome 08/21/2006   Past Surgical History:  Procedure Laterality Date   APPENDECTOMY     COLONOSCOPY  07/25/2006   POLYPECTOMY     s/p knee surgery Bilateral    x's 4 since 1980   s/p left shoulder  2002   rotator cuff   s/p lumbar disc surgery  2007   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 His smoking use included cigarettes. He has a 15.00 pack-year smoking history. He has never been exposed to tobacco smoke. He has never used smokeless tobacco. He reports current alcohol use of about 4.0 standard drinks per week. He reports that he does not use drugs. family history includes Alzheimer's disease in  his mother; Diabetes in his maternal uncle and mother; Kidney cancer in his brother. Allergies  Allergen Reactions   Iohexol Itching    Itching post 125cc Omni injection. Treated w/ 50 mg PO benedryl.  Onset Date: 84132440    Current Outpatient Medications on File Prior to Visit  Medication Sig Dispense Refill   aspirin 81 MG EC tablet Take 1 tablet (81 mg total) by mouth daily. Swallow whole. 30 tablet 12   atorvastatin (LIPITOR) 20 MG tablet Take 1 tablet (20 mg total) by mouth daily. 90 tablet 3   ketoconazole (NIZORAL) 2 % cream Apply 1 application. topically daily. 30 g 2   meloxicam (MOBIC) 15 MG tablet TAKE 1 TABLET BY MOUTH EVERY DAY 90 tablet 3   methocarbamol (ROBAXIN) 500 MG tablet Take 500 mg by mouth 3 (three) times daily.     pantoprazole (PROTONIX) 40 MG tablet TAKE 1 TABLET BY MOUTH EVERY DAY 90 tablet 3   traMADol (ULTRAM) 50 MG tablet Take 1 tablet (50 mg total) by mouth every 12 (twelve) hours as needed. 180 tablet 0   triamcinolone (NASACORT) 55 MCG/ACT AERO nasal inhaler Place 2 sprays into the nose daily. 1 Inhaler 12   nitroGLYCERIN (NITROSTAT) 0.4 MG SL tablet Place 1 tablet (0.4 mg total) under the tongue every 5 (five) minutes as needed for chest pain. (Patient not taking: Reported on 06/06/2021) 25 tablet 3   traZODone (DESYREL) 50 MG tablet TAKE 0.5-1 TABLETS BY MOUTH AT BEDTIME AS NEEDED FOR SLEEP. (Patient not taking: Reported on 06/06/2021) 90 tablet 1   No current facility-administered medications on file prior to visit.        ROS:  All others reviewed and negative.  Objective        PE:  BP 126/66 (BP Location: Right Arm, Patient Position: Sitting, Cuff Size: Large)   Pulse 64   Temp 98.6 F (37 C) (Oral)   Ht '5\' 11"'$  (1.803 m)   Wt 174 lb (78.9 kg)   SpO2 98%   BMI 24.27 kg/m                 Constitutional: Pt appears in NAD               HENT: Head: NCAT.                Right Ear: External ear normal.                 Left Ear: External ear normal.                 Eyes: . Pupils are equal, round, and reactive to light. Conjunctivae and EOM are normal               Nose: without d/c or deformity               Neck: Neck supple. Gross normal ROM  Cardiovascular: Normal rate and regular rhythm.                 Pulmonary/Chest: Effort normal and breath sounds without rales or wheezing.                Abd:  Soft, NT, ND, + BS, no organomegaly               Neurological: Pt is alert. At baseline orientation, motor grossly intact, cn 2-12 intact               Skin: Skin is warm. No rashes, no other new lesions, LE edema - none               Psychiatric: Pt behavior is normal without agitation   Micro: none  Cardiac tracings I have personally interpreted today:  none  Pertinent Radiological findings (summarize): none   Lab Results  Component Value Date   WBC 6.1 06/06/2021   HGB 13.7 06/06/2021   HCT 40.9 06/06/2021   PLT 168.0 06/06/2021   GLUCOSE 87 06/06/2021   CHOL 175 06/06/2021   TRIG 114.0 06/06/2021   HDL 58.60 06/06/2021   LDLDIRECT 100.0 07/14/2018   LDLCALC 94 06/06/2021   ALT 12 06/06/2021   AST 14 06/06/2021   NA 137 06/06/2021   K 4.3 06/06/2021   CL 100 06/06/2021   CREATININE 0.93 06/06/2021   BUN 18 06/06/2021   CO2 30 06/06/2021   TSH 1.56 06/06/2021   PSA 2.50 06/06/2021   INR 0.89 05/02/2012   HGBA1C 6.1 06/06/2021   Assessment/Plan:  Jason Robles is a 76 y.o. Other or two or more races [6] male with  has a past medical history of ABDOMINAL PAIN OTHER SPECIFIED SITE (04/30/2008), ALLERGIC RHINITIS (08/25/2007), Anxiety (06/28/2010), BENIGN PROSTATIC HYPERTROPHY (08/21/2006), Chest pain (06/28/2010), COLONIC POLYPS, HX OF (71/24/5809), Complication of anesthesia, DEGENERATIVE JOINT DISEASE, KNEES, BILATERAL (08/21/2006), Diverticulosis of colon (without mention of hemorrhage) (04/18/2007), EXTERNAL HEMORRHOIDS (04/18/2007), GERD (gastroesophageal reflux disease), Headache(784.0)  (09/20/2009), HEARING LOSS, RIGHT EAR (08/21/2006), HYPERLIPIDEMIA (08/21/2006), INSOMNIA-SLEEP DISORDER-UNSPEC (09/20/2009), NONSPECIFIC ABN FINDING RAD & OTH EXAM GU ORGAN (05/03/2008), PERIPHERAL VASCULAR DISEASE (08/25/2007), PONV (postoperative nausea and vomiting), and Raynaud's syndrome (08/21/2006).  Encounter for well adult exam with abnormal findings Age and sex appropriate education and counseling updated with regular exercise and diet Referrals for preventative services - none needed Immunizations addressed - declines covid booster, to have shingrix and tdap at local pharmacy Smoking counseling  - none needed Evidence for depression or other mood disorder - none significant Most recent labs reviewed. I have personally reviewed and have noted: 1) the patient's medical and social history 2) The patient's current medications and supplements 3) The patient's height, weight, and BMI have been recorded in the chart   Hyperlipidemia Lab Results  Component Value Date   [place] 94 06/06/2021   Uncontrolled, goal ldl < 70,, pt to continue current statin lipitor 20 mg as declines change for now   Hyperglycemia Lab Results  Component Value Date   HGBA1C 6.1 06/06/2021   Stable, pt to continue current medical treatment  - diet, wt control   Nonintractable headache New onset left head with blurred vision, for MRI head  Blurred vision, left eye Also for optho f/u, pt will call  Insomnia Recent worsening, has been trazodone intolerant, now for belsomra prn,  to f/u any worsening symptoms or concerns  B12 deficiency Lab Results  Component Value Date   VITAMINB12 177 (L) 06/06/2021  Low, to start oral replacement - b12 1000 mcg qd   Vitamin D deficiency Last vitamin D Lab Results  Component Value Date   VD25OH 29.17 (L) 06/06/2021   Low, to start oral replacement   Followup: Return in about 6 months (around 12/06/2021).  Cathlean Cower, MD 06/07/2021 10:13 PM [facility]

## 2021-06-07 ENCOUNTER — Encounter: Payer: Self-pay | Admitting: Internal Medicine

## 2021-06-07 DIAGNOSIS — R519 Headache, unspecified: Secondary | ICD-10-CM | POA: Insufficient documentation

## 2021-06-07 DIAGNOSIS — E559 Vitamin D deficiency, unspecified: Secondary | ICD-10-CM

## 2021-06-07 DIAGNOSIS — E538 Deficiency of other specified B group vitamins: Secondary | ICD-10-CM | POA: Insufficient documentation

## 2021-06-07 DIAGNOSIS — H538 Other visual disturbances: Secondary | ICD-10-CM

## 2021-06-07 HISTORY — DX: Other visual disturbances: H53.8

## 2021-06-07 HISTORY — DX: Deficiency of other specified B group vitamins: E53.8

## 2021-06-07 HISTORY — DX: Vitamin D deficiency, unspecified: E55.9

## 2021-06-07 NOTE — Assessment & Plan Note (Signed)
Recent worsening, has been trazodone intolerant, now for belsomra prn,  to f/u any worsening symptoms or concerns

## 2021-06-07 NOTE — Assessment & Plan Note (Signed)
Lab Results  Component Value Date   LDLCALC 94 06/06/2021   Uncontrolled, goal ldl < 70,, pt to continue current statin lipitor 20 mg as declines change for now

## 2021-06-07 NOTE — Assessment & Plan Note (Signed)
New onset left head with blurred vision, for MRI head

## 2021-06-07 NOTE — Assessment & Plan Note (Signed)
Lab Results  Component Value Date   VITAMINB12 177 (L) 06/06/2021   Low, to start oral replacement - b12 1000 mcg qd

## 2021-06-07 NOTE — Assessment & Plan Note (Signed)
Age and sex appropriate education and counseling updated with regular exercise and diet Referrals for preventative services - none needed Immunizations addressed - declines covid booster, to have shingrix and tdap at local pharmacy Smoking counseling  - none needed Evidence for depression or other mood disorder - none significant Most recent labs reviewed. I have personally reviewed and have noted: 1) the patient's medical and social history 2) The patient's current medications and supplements 3) The patient's height, weight, and BMI have been recorded in the chart

## 2021-06-07 NOTE — Assessment & Plan Note (Signed)
Also for optho f/u, pt will call

## 2021-06-07 NOTE — Assessment & Plan Note (Signed)
Lab Results  Component Value Date   HGBA1C 6.1 06/06/2021   Stable, pt to continue current medical treatment  - diet, wt control

## 2021-06-07 NOTE — Assessment & Plan Note (Signed)
Last vitamin D Lab Results  Component Value Date   VD25OH 29.17 (L) 06/06/2021   Low, to start oral replacement

## 2021-06-11 ENCOUNTER — Other Ambulatory Visit: Payer: Self-pay | Admitting: Internal Medicine

## 2021-06-11 NOTE — Telephone Encounter (Signed)
Please refill as per office routine med refill policy (all routine meds to be refilled for 3 mo or monthly (per pt preference) up to one year from last visit, then month to month grace period for 3 mo, then further med refills will have to be denied) ? ?

## 2021-06-12 ENCOUNTER — Telehealth: Payer: Self-pay | Admitting: Internal Medicine

## 2021-06-12 NOTE — Telephone Encounter (Signed)
Patient would like to stay on the Robaxin - he had discussed changing to something different with Dr. Jenny Reichmann but patient states that he changed time he was taking medication and that seemed to help.

## 2021-06-14 ENCOUNTER — Telehealth: Payer: Self-pay

## 2021-06-14 MED ORDER — METHOCARBAMOL 500 MG PO TABS: 500.0000 mg | ORAL_TABLET | Freq: Three times a day (TID) | ORAL | 2 refills | Status: AC

## 2021-06-14 NOTE — Telephone Encounter (Signed)
Pt is requesting to see if when he has his MRI done on Tuesday 06/20/21 if there is a way to read it to determine if pt is developing Dementia.   Pt is thinking that a comparison of MRI should be able to determine shrinking of the brain.  Pt is asking for a call back to let him know if that is possible being that its an MRI for something else.  Please advise

## 2021-06-14 NOTE — Telephone Encounter (Signed)
Ok I refilled the robaxin

## 2021-06-14 NOTE — Telephone Encounter (Signed)
Sorry, MRI can show brain shrinkage, but there really is on imaging test to diagnose dementia.  I can refer to neurology if he likes.  thanks

## 2021-06-15 NOTE — Telephone Encounter (Signed)
Patient notified of message as seen below from provider. Patient declines neurology referral at this time

## 2021-06-16 ENCOUNTER — Other Ambulatory Visit: Payer: Self-pay | Admitting: Internal Medicine

## 2021-06-20 ENCOUNTER — Ambulatory Visit
Admission: RE | Admit: 2021-06-20 | Discharge: 2021-06-20 | Disposition: A | Discharge status: Home / Self Care | Payer: Medicare HMO | Source: Ambulatory Visit | Attending: Internal Medicine | Admitting: Internal Medicine

## 2021-06-20 DIAGNOSIS — I611 Nontraumatic intracerebral hemorrhage in hemisphere, cortical: Secondary | ICD-10-CM | POA: Diagnosis not present

## 2021-06-20 DIAGNOSIS — R519 Headache, unspecified: Secondary | ICD-10-CM

## 2021-06-20 DIAGNOSIS — H538 Other visual disturbances: Secondary | ICD-10-CM | POA: Diagnosis not present

## 2021-06-20 DIAGNOSIS — I6782 Cerebral ischemia: Secondary | ICD-10-CM | POA: Diagnosis not present

## 2021-06-28 ENCOUNTER — Encounter: Payer: Self-pay | Admitting: Internal Medicine

## 2021-06-28 ENCOUNTER — Other Ambulatory Visit: Payer: Self-pay | Admitting: Internal Medicine

## 2021-06-28 ENCOUNTER — Ambulatory Visit (INDEPENDENT_AMBULATORY_CARE_PROVIDER_SITE_OTHER): Payer: Medicare HMO | Admitting: Internal Medicine

## 2021-06-28 VITALS — BP 120/68 | HR 71 | Temp 98.1°F | Ht 71.0 in | Wt 172.8 lb

## 2021-06-28 DIAGNOSIS — R519 Headache, unspecified: Secondary | ICD-10-CM | POA: Diagnosis not present

## 2021-06-28 DIAGNOSIS — R413 Other amnesia: Secondary | ICD-10-CM | POA: Diagnosis not present

## 2021-06-28 DIAGNOSIS — F419 Anxiety disorder, unspecified: Secondary | ICD-10-CM

## 2021-06-28 DIAGNOSIS — M159 Polyosteoarthritis, unspecified: Secondary | ICD-10-CM

## 2021-06-28 DIAGNOSIS — F5101 Primary insomnia: Secondary | ICD-10-CM

## 2021-06-28 DIAGNOSIS — M19011 Primary osteoarthritis, right shoulder: Secondary | ICD-10-CM

## 2021-06-28 DIAGNOSIS — R69 Illness, unspecified: Secondary | ICD-10-CM | POA: Diagnosis not present

## 2021-06-28 MED ORDER — TRAMADOL HCL 50 MG PO TABS: 50.0000 mg | ORAL_TABLET | Freq: Two times a day (BID) | ORAL | 3 refills | Status: DC | PRN

## 2021-06-28 MED ORDER — UBRELVY 100 MG PO TABS: ORAL_TABLET | ORAL | 5 refills | Status: DC

## 2021-06-28 MED ORDER — BUTALBITAL-APAP-CAFFEINE 50-325-40 MG PO TABS: 1.0000 | ORAL_TABLET | Freq: Four times a day (QID) | ORAL | 0 refills | Status: AC | PRN

## 2021-06-28 NOTE — Telephone Encounter (Signed)
Ok for PA - has been intolerant imitrex in past

## 2021-06-28 NOTE — Progress Notes (Signed)
Patient ID: Jason Robles, male   DOB: June 20, 1945, 76 y.o.   MRN: [id number]        Chief Complaint: follow up with wife with migraine, anxiety, memory changes, and chronic right shoulder pain       HPI:  [name] is a 76 y.o. male here with wife who is suggesting possible ADD for him undiagnosed or tx, has strong FH dementia in mother and sister, and pt with recurring left sided periocular  and forehead migraous type headaches.  Recent MRI neg.  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  Has had some subjective memory changes, has been referred to neurology.  Also has chronic right shoulder pain with known severe DJD, asking for tramadol refill.  Pt denies chest pain, increased sob or doe, wheezing, orthopnea, PND, increased LE swelling, palpitations, dizziness or syncope.   Pt denies polydipsia, polyuria, or new focal neuro s/s.    Pt denies fever, wt loss, night sweats, loss of appetite, or other constitutional symptoms         Wt Readings from Last 3 Encounters:  06/28/21 172 lb 12.8 oz (78.4 kg)  06/06/21 174 lb (78.9 kg)  03/13/21 174 lb (78.9 kg)   BP Readings from Last 3 Encounters:  06/28/21 120/68  06/06/21 126/66  03/13/21 110/76         Past Medical History:  Diagnosis Date   ABDOMINAL PAIN OTHER SPECIFIED SITE 04/30/2008   ALLERGIC RHINITIS 08/25/2007   Anxiety 06/28/2010   BENIGN PROSTATIC HYPERTROPHY 08/21/2006   Chest pain 06/28/2010   COLONIC POLYPS, HX OF 64/40/3474   Complication of anesthesia    post op nausea and vomiting and wakes up too early per pt   DEGENERATIVE JOINT DISEASE, KNEES, 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   Diverticulosis of colon (without mention of hemorrhage) 04/18/2007   EXTERNAL HEMORRHOIDS 04/18/2007   GERD (gastroesophageal reflux disease)    Headache(784.0) 09/20/2009   hx migraines   HEARING LOSS, 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    HYPERLIPIDEMIA 08/21/2006   INSOMNIA-SLEEP DISORDER-UNSPEC 09/20/2009   NONSPECIFIC ABN FINDING RAD & OTH EXAM GU ORGAN 05/03/2008   PERIPHERAL VASCULAR DISEASE 08/25/2007   PONV (postoperative nausea and vomiting)    Raynaud's syndrome 08/21/2006   Past Surgical History:  Procedure Laterality Date   APPENDECTOMY     COLONOSCOPY  07/25/2006   POLYPECTOMY     s/p knee surgery Bilateral    x's 4 since 1980   s/p left shoulder  2002   rotator cuff   s/p lumbar disc surgery  2007   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 His smoking use included cigarettes. He has a 15.00 pack-year smoking history. He has never been exposed to tobacco smoke. He has never used smokeless tobacco. He reports current alcohol use of about 4.0 standard drinks of alcohol per week. He reports that he does not use drugs. family history includes Alzheimer's disease in his mother; Diabetes in his maternal uncle and  mother; Kidney cancer in his brother. Allergies  Allergen Reactions   Iohexol Itching    Itching post 125cc Omni injection. Treated w/ 50 mg PO benedryl.  Onset Date: [id number]    Current Outpatient Medications on File Prior to Visit  Medication Sig Dispense Refill   aspirin 81 MG EC tablet Take 1 tablet (81 mg total) by mouth daily. Swallow whole. 30 tablet 12   atorvastatin (LIPITOR) 20 MG tablet Take 1 tablet (20 mg total) by mouth daily. 90 tablet 3   ketoconazole (NIZORAL) 2 % cream Apply 1 application. topically daily. 30 g 2   meloxicam (MOBIC) 15 MG tablet TAKE 1 TABLET  BY MOUTH EVERY DAY 90 tablet 3   methocarbamol (ROBAXIN) 500 MG tablet Take 1 tablet (500 mg total) by mouth 3 (three) times daily. 90 tablet 2   pantoprazole (PROTONIX) 40 MG tablet TAKE 1 TABLET BY MOUTH EVERY DAY 90 tablet 3   traZODone (DESYREL) 50 MG tablet Take 0.5-1 tablets by mouth at bedtime as needed.     triamcinolone (NASACORT) 55 MCG/ACT AERO nasal inhaler Place 2 sprays into the nose daily. 1 Inhaler 12   nitroGLYCERIN (NITROSTAT) 0.4 MG SL tablet Place 1 tablet (0.4 mg total) under the tongue every 5 (five) minutes as needed for chest pain. (Patient not taking: Reported on 06/06/2021) 25 tablet 3   Suvorexant (BELSOMRA) 15 MG TABS Take 15 mg by mouth at bedtime as needed. (Patient not taking: Reported on 06/28/2021) 30 tablet 5   No current facility-administered medications on file prior to visit.        ROS:  All others reviewed and negative.  Objective        PE:  BP 120/68 (BP Location: Right Arm, Patient Position: Sitting, Cuff Size: Large)   Pulse 71   Temp 98.1 F (36.7 C) (Oral)   Ht '5\' 11"'$  (1.803 m)   Wt 172 lb 12.8 oz (78.4 kg)   SpO2 95%   BMI 24.10 kg/m                 Constitutional: Pt appears in NAD               HENT: Head: NCAT.                Right Ear: External ear normal.                 Left Ear: External ear normal.                Eyes: . Pupils are equal, round, and reactive to light. Conjunctivae and EOM are normal               Nose: without d/c or deformity               Neck: Neck supple. Gross normal ROM               Cardiovascular: Normal rate and regular rhythm.                 Pulmonary/Chest: Effort normal and breath sounds without rales or wheezing.                Abd:  Soft, NT, ND, + BS, no organomegaly               Neurological: Pt is alert. At baseline orientation, motor grossly intact               Skin: Skin  is warm. No rashes, no other new lesions, LE edema - none               Psychiatric: Pt behavior is normal without  agitation   Micro: none  Cardiac tracings I have personally interpreted today:  none  Pertinent Radiological findings (summarize): none   Lab Results  Component Value Date   WBC 6.1 06/06/2021   HGB 13.7 06/06/2021   HCT 40.9 06/06/2021   PLT 168.0 06/06/2021   GLUCOSE 87 06/06/2021   CHOL 175 06/06/2021   TRIG 114.0 06/06/2021   HDL 58.60 06/06/2021   LDLDIRECT 100.0 07/14/2018   LDLCALC 94 06/06/2021   ALT 12 06/06/2021   AST 14 06/06/2021   NA 137 06/06/2021   K 4.3 06/06/2021   CL 100 06/06/2021   CREATININE 0.93 06/06/2021   BUN 18 06/06/2021   CO2 30 06/06/2021   TSH 1.56 06/06/2021   PSA 2.50 06/06/2021   INR 0.89 05/02/2012   HGBA1C 6.1 06/06/2021   Assessment/Plan:  Shakai Dolley Mcdonnell is a 76 y.o. Other or two or more races [6] male with  has a past medical history of ABDOMINAL PAIN OTHER SPECIFIED SITE (04/30/2008), ALLERGIC RHINITIS (08/25/2007), Anxiety (06/28/2010), BENIGN PROSTATIC HYPERTROPHY (08/21/2006), Chest pain (06/28/2010), COLONIC POLYPS, HX OF (51/88/4166), Complication of anesthesia, DEGENERATIVE JOINT DISEASE, KNEES, BILATERAL (08/21/2006), Diverticulosis of colon (without mention of hemorrhage) (04/18/2007), EXTERNAL HEMORRHOIDS (04/18/2007), GERD (gastroesophageal reflux disease), Headache(784.0) (09/20/2009), HEARING LOSS, RIGHT EAR (08/21/2006), HYPERLIPIDEMIA (08/21/2006), INSOMNIA-SLEEP DISORDER-UNSPEC (09/20/2009), NONSPECIFIC ABN FINDING RAD & OTH EXAM GU ORGAN (05/03/2008), PERIPHERAL VASCULAR DISEASE (08/25/2007), PONV (postoperative nausea and vomiting), and Raynaud's syndrome (08/21/2006).  Osteoarthritis of glenohumeral joint Chronic stable persistent pain, for tramadol refill,  to f/u any worsening symptoms or concerns   Memory changes Subjective, for neurology referral soon  Anxiety With recent situational worsening, declines further tx for now or referral for counseling  Nonintractable headache C/w likely ocular or similar  migraine, for ubrelvy or fioricet depending on coverage  Followup: Return in about 4 months (around 10/28/2021).  Cathlean Cower, MD 07/01/2021 9:24 PM [facility]

## 2021-06-28 NOTE — Patient Instructions (Signed)
Please take all new medication as prescribed  - the ubrelvy if ok with insurance, but otherwise the fioricet as needed for Headache  Please continue all other medications as before, and refills have been done if requested - tramadol  Please have the pharmacy call with any other refills you may need.  Please continue your efforts at being more active, low cholesterol diet, and weight control.  Please keep your appointments with your specialists as you may have planned  You will be contacted regarding the referral for: Neurology  Please make an Appointment to return in 4 months, or sooner if needed

## 2021-06-29 ENCOUNTER — Telehealth: Payer: Self-pay | Admitting: *Deleted

## 2021-06-29 NOTE — Telephone Encounter (Signed)
Rec'd msg from Dr. Jenny Reichmann assisit Lovena Le) stating [2:17 PM] Terence Lux [facility])  Hey Hector Venne do you have a PA for last name Gibbons? It's for Roselyn Meier  [2:28 PM] Neita Garnet.. No and not on my dashboard  [2:29 PM] Tomma Lightning  whats the first name  [2:30 PM] Terence Lux [facility])  Percell Miller  Submitted PA via cover-my-meds w/ (Key: [id number]). Rec'd msg " Your information has been submitted to [employer].".Marland KitchenJohny Chess

## 2021-06-29 NOTE — Telephone Encounter (Signed)
Rec'd determination fax med was APPROVED. EFFECTIVE 01/01/2021 - 12/31/2021. Faxed approval letter to [place].Marland KitchenJohny Chess

## 2021-07-01 ENCOUNTER — Encounter: Payer: Self-pay | Admitting: Internal Medicine

## 2021-07-01 NOTE — Assessment & Plan Note (Signed)
Chronic stable persistent pain, for tramadol refill,  to f/u any worsening symptoms or concerns

## 2021-07-01 NOTE — Assessment & Plan Note (Signed)
Subjective, for neurology referral soon

## 2021-07-01 NOTE — Assessment & Plan Note (Deleted)
C/w likely ocular or similar migraine, for ubrelvy or fioricet depending on coverage

## 2021-07-01 NOTE — Assessment & Plan Note (Signed)
C/w likely ocular or similar migraine, for ubrelvy or fioricet depending on coverage

## 2021-07-01 NOTE — Assessment & Plan Note (Signed)
With recent situational worsening, declines further tx for now or referral for counseling

## 2021-07-03 ENCOUNTER — Encounter: Payer: Self-pay | Admitting: Physician Assistant

## 2021-07-11 ENCOUNTER — Ambulatory Visit: Payer: Medicare HMO | Admitting: Physician Assistant

## 2021-07-11 ENCOUNTER — Other Ambulatory Visit: Payer: Medicare HMO

## 2021-07-11 ENCOUNTER — Encounter: Payer: Self-pay | Admitting: Physician Assistant

## 2021-07-11 VITALS — BP 147/90 | HR 58 | Resp 18 | Ht 71.0 in | Wt 174.0 lb

## 2021-07-11 DIAGNOSIS — R413 Other amnesia: Secondary | ICD-10-CM

## 2021-07-11 DIAGNOSIS — G459 Transient cerebral ischemic attack, unspecified: Secondary | ICD-10-CM

## 2021-07-11 DIAGNOSIS — R519 Headache, unspecified: Secondary | ICD-10-CM

## 2021-07-11 LAB — SEDIMENTATION RATE: Sed Rate: 4 mm/hr (ref 0–20)

## 2021-07-11 NOTE — Patient Instructions (Signed)
It was a pleasure to see you today at our office.   Recommendations:  Neurocognitive evaluation at our office MRA of the head and neck  the radiology office will call you to arrange you appointment Check labs today Follow up in 1 Replenish B12 and D Continue Aspirin Consider psychotherapy for PTSD Consider Sleep study   Whom to call:  Memory  decline, memory medications: Call our office [phone]   For psychiatric meds, mood meds: Please have your primary care physician manage these medications.   Counseling regarding caregiver distress, including caregiver depression, anxiety and issues regarding community resources, adult day care programs, adult living facilities, or memory care questions:   Feel free to contact [place], Social Worker at [phone]   For assessment of decision of mental capacity and competency:  Call Dr. Anthoney Harada, geriatric psychiatrist at [phone]  For guidance in geriatric dementia issues please call [facility] [phone]  For guidance regarding [facility] and if placement were needed at the facility, contact Arnell Asal, Social Worker tel: [phone]  If you have any severe symptoms of a stroke, or other severe issues such as confusion,severe chills or fever, etc call 911 or go to the ER as you may need to be evaluated further      RECOMMENDATIONS FOR ALL PATIENTS WITH MEMORY PROBLEMS: 1. Continue to exercise (Recommend 30 minutes of walking everyday, or 3 hours every week) 2. Increase social interactions - continue going to [place] and enjoy social gatherings with friends and family 3. Eat healthy, avoid fried foods and eat more fruits and vegetables 4. Maintain adequate blood pressure, blood sugar, and blood cholesterol level. Reducing the risk of stroke and cardiovascular disease also helps promoting better memory. 5. Avoid stressful situations. Live a simple life and avoid  aggravations. Organize your time and prepare for the next day in anticipation. 6. Sleep well, avoid any interruptions of sleep and avoid any distractions in the bedroom that may interfere with adequate sleep quality 7. Avoid sugar, avoid sweets as there is a strong link between excessive sugar intake, diabetes, and cognitive impairment We discussed the Mediterranean diet, which has been shown to help patients reduce the risk of progressive memory disorders and reduces cardiovascular risk. This includes eating fish, eat fruits and green leafy vegetables, nuts like almonds and hazelnuts, walnuts, and also use olive oil. Avoid fast foods and fried foods as much as possible. Avoid sweets and sugar as sugar use has been linked to worsening of memory function.  There is always a concern of gradual progression of memory problems. If this is the case, then we may need to adjust level of care according to patient needs. Support, both to the patient and caregiver, should then be put into place.      You have been referred for a neuropsychological evaluation (i.e., evaluation of memory and thinking abilities). Please bring someone with you to this appointment if possible, as it is helpful for the doctor to hear from both you and another adult who knows you well. Please bring eyeglasses and hearing aids if you wear them.    The evaluation will take approximately 3 hours and has two parts:   The first part is a clinical interview with the neuropsychologist (Dr. Melvyn Novas or Dr. Nicole Kindred). During the interview, the neuropsychologist will speak with you and the individual you brought to the appointment.    The second part of the evaluation is testing with the doctor's technician Hinton Dyer or  Kim). During the testing, the technician will ask you to remember different types of material, solve problems, and answer some questionnaires. Your family member will not be present for this portion of the evaluation.   Please note: We  must reserve several hours of the neuropsychologist's time and the psychometrician's time for your evaluation appointment. As such, there is a No-Show fee of $100. If you are unable to attend any of your appointments, please contact our office as soon as possible to reschedule.    FALL PRECAUTIONS: Be cautious when walking. Scan the area for obstacles that may increase the risk of trips and falls. When getting up in the mornings, sit up at the edge of the bed for a few minutes before getting out of bed. Consider elevating the bed at the head end to avoid drop of blood pressure when getting up. Walk always in a well-lit room (use night lights in the walls). Avoid area rugs or power cords from appliances in the middle of the walkways. Use a walker or a cane if necessary and consider physical therapy for balance exercise. Get your eyesight checked regularly.  FINANCIAL OVERSIGHT: Supervision, especially oversight when making financial decisions or transactions is also recommended.  HOME SAFETY: Consider the safety of the kitchen when operating appliances like stoves, microwave oven, and blender. Consider having supervision and share cooking responsibilities until no longer able to participate in those. Accidents with firearms and other hazards in the house should be identified and addressed as well.   ABILITY TO BE LEFT ALONE: If patient is unable to contact 911 operator, consider using LifeLine, or when the need is there, arrange for someone to stay with patients. Smoking is a fire hazard, consider supervision or cessation. Risk of wandering should be assessed by caregiver and if detected at any point, supervision and safe proof recommendations should be instituted.  MEDICATION SUPERVISION: Inability to self-administer medication needs to be constantly addressed. Implement a mechanism to ensure safe administration of the medications.   DRIVING: Regarding driving, in patients with progressive memory  problems, driving will be impaired. We advise to have someone else do the driving if trouble finding directions or if minor accidents are reported. Independent driving assessment is available to determine safety of driving.   If you are interested in the driving assessment, you can contact the following:  [employer] in [place]  [place] [phone] or [phone]    [place] refers to food and lifestyle choices that are based on the traditions of countries located on the [employer]. This way of eating has been shown to help prevent certain conditions and improve outcomes for people who have chronic diseases, like kidney disease and heart disease. What are tips for following this plan? Lifestyle  Cook and eat meals together with your family, when possible. Drink enough fluid to keep your urine clear or pale yellow. Be physically active every day. This includes: Aerobic exercise like running or swimming. Leisure activities like gardening, walking, or housework. Get 7-8 hours of sleep each night. If recommended by your health care provider, drink red wine in moderation. This means 1 glass a day for nonpregnant women and 2 glasses a day for men. A glass of wine equals 5 oz (150 mL). Reading food labels  Check the serving size of packaged foods. For foods such as rice and pasta, the serving size refers to the amount of  cooked product, not dry. Check the total fat in packaged foods. Avoid foods that have saturated fat or trans fats. Check the ingredients list for added sugars, such as corn syrup. Shopping  At the grocery store, buy most of your food from the areas near the walls of the store. This includes: Fresh fruits and vegetables (produce). Grains, beans, nuts, and seeds. Some of these may be available in unpackaged forms or  large amounts (in bulk). Fresh seafood. Poultry and eggs. Low-fat dairy products. Buy whole ingredients instead of prepackaged foods. Buy fresh fruits and vegetables in-season from local farmers markets. Buy frozen fruits and vegetables in resealable bags. If you do not have access to quality fresh seafood, buy precooked frozen shrimp or canned fish, such as tuna, salmon, or sardines. Buy small amounts of raw or cooked vegetables, salads, or olives from the deli or salad bar at your store. Stock your pantry so you always have certain foods on hand, such as olive oil, canned tuna, canned tomatoes, rice, pasta, and beans. Cooking  Cook foods with extra-virgin olive oil instead of using butter or other vegetable oils. Have meat as a side dish, and have vegetables or grains as your main dish. This means having meat in small portions or adding small amounts of meat to foods like pasta or stew. Use beans or vegetables instead of meat in common dishes like chili or lasagna. Experiment with different cooking methods. Try roasting or broiling vegetables instead of steaming or sauteing them. Add frozen vegetables to soups, stews, pasta, or rice. Add nuts or seeds for added healthy fat at each meal. You can add these to yogurt, salads, or vegetable dishes. Marinate fish or vegetables using olive oil, lemon juice, garlic, and fresh herbs. Meal planning  Plan to eat 1 vegetarian meal one day each week. Try to work up to 2 vegetarian meals, if possible. Eat seafood 2 or more times a week. Have healthy snacks readily available, such as: Vegetable sticks with hummus. Greek yogurt. Fruit and nut trail mix. Eat balanced meals throughout the week. This includes: Fruit: 2-3 servings a day Vegetables: 4-5 servings a day Low-fat dairy: 2 servings a day Fish, poultry, or lean meat: 1 serving a day Beans and legumes: 2 or more servings a week Nuts and seeds: 1-2 servings a day Whole grains: 6-8 servings a  day Extra-virgin olive oil: 3-4 servings a day Limit red meat and sweets to only a few servings a month What are my food choices? Mediterranean diet Recommended Grains: Whole-grain pasta. Brown rice. Bulgar wheat. Polenta. Couscous. Whole-wheat bread. [place]. Vegetables: Artichokes. Beets. Broccoli. Cabbage. Carrots. Eggplant. Green beans. Chard. Kale. Spinach. Onions. Leeks. Peas. Squash. Tomatoes. Peppers. Radishes. Fruits: Apples. Apricots. Avocado. Berries. Bananas. Cherries. Dates. Figs. Grapes. Lemons. Melon. Oranges. Peaches. Plums. Pomegranate. Meats and other protein foods: Beans. Almonds. Sunflower seeds. Pine nuts. Peanuts. [place]. Salmon. Scallops. Shrimp. Virgin. Tilapia. Clams. Oysters. Eggs. Dairy: Low-fat milk. Cheese. Greek yogurt. Beverages: Water. Red wine. Herbal tea. Fats and oils: Extra virgin olive oil. Avocado oil. Grape seed oil. Sweets and desserts: [place] yogurt with honey. Baked apples. Poached pears. Trail mix. Seasoning and other foods: Basil. Cilantro. Coriander. Cumin. Mint. Parsley. Sage. Rosemary. Tarragon. Garlic. Oregano. Thyme. Pepper. Balsalmic vinegar. Tahini. Hummus. Tomato sauce. Olives. Mushrooms. Limit these Grains: Prepackaged pasta or rice dishes. Prepackaged cereal with added sugar. Vegetables: Deep fried potatoes (french fries). Fruits: Fruit canned in syrup. Meats and other protein foods: Beef. Pork. Lamb. Poultry with skin. Hot dogs.  [place] Salines. Dairy: Ice cream. Sour cream. Whole milk. Beverages: Juice. Sugar-sweetened soft drinks. Beer. Liquor and spirits. Fats and oils: Butter. Canola oil. Vegetable oil. Beef fat (tallow). Lard. Sweets and desserts: Cookies. Cakes. Pies. Candy. Seasoning and other foods: Mayonnaise. Premade sauces and marinades. The items listed may not be a complete list. Talk with your dietitian about what dietary choices are right for you. Summary The Mediterranean diet includes both food and lifestyle choices. Eat a  variety of fresh fruits and vegetables, beans, nuts, seeds, and whole grains. Limit the amount of red meat and sweets that you eat. Talk with your health care provider about whether it is safe for you to drink red wine in moderation. This means 1 glass a day for nonpregnant women and 2 glasses a day for men. A glass of wine equals 5 oz (150 mL). This information is not intended to replace advice given to you by your health care provider. Make sure you discuss any questions you have with your health care provider. Document Released: 08/11/2015 Document Revised: 09/13/2015 Document Reviewed: 08/11/2015 Elsevier Interactive Patient Education  2017 [employer].
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
## 2021-07-22 ENCOUNTER — Ambulatory Visit
Admission: RE | Admit: 2021-07-22 | Discharge: 2021-07-22 | Disposition: A | Discharge status: Home / Self Care | Payer: Medicare HMO | Source: Ambulatory Visit | Attending: Physician Assistant | Admitting: Physician Assistant

## 2021-07-22 DIAGNOSIS — R519 Headache, unspecified: Secondary | ICD-10-CM | POA: Diagnosis not present

## 2021-07-22 DIAGNOSIS — R413 Other amnesia: Secondary | ICD-10-CM | POA: Diagnosis not present

## 2021-07-22 MED ORDER — GADOBENATE DIMEGLUMINE 529 MG/ML IV SOLN: 16.0000 mL | Freq: Once | INTRAVENOUS | Status: DC | PRN

## 2021-07-22 MED ORDER — GADOBENATE DIMEGLUMINE 529 MG/ML IV SOLN
16.0000 mL | Freq: Once | INTRAVENOUS | Status: AC | PRN
  Administered 2021-07-22: 16 mL via INTRAVENOUS

## 2021-07-24 NOTE — Progress Notes (Signed)
Please inform the patient that the MR a of the head and neck are normal.  Thank you

## 2021-08-15 ENCOUNTER — Encounter: Payer: Self-pay | Admitting: Psychology

## 2021-08-17 ENCOUNTER — Ambulatory Visit: Payer: Medicare HMO | Admitting: Physician Assistant

## 2021-08-31 ENCOUNTER — Other Ambulatory Visit: Payer: Self-pay | Admitting: Orthopedic Surgery

## 2021-08-31 DIAGNOSIS — M19012 Primary osteoarthritis, left shoulder: Secondary | ICD-10-CM | POA: Diagnosis not present

## 2021-08-31 DIAGNOSIS — M19011 Primary osteoarthritis, right shoulder: Secondary | ICD-10-CM | POA: Diagnosis not present

## 2021-09-26 ENCOUNTER — Ambulatory Visit
Admission: RE | Admit: 2021-09-26 | Discharge: 2021-09-26 | Disposition: A | Discharge status: Home / Self Care | Payer: Medicare HMO | Source: Ambulatory Visit | Attending: Orthopedic Surgery | Admitting: Orthopedic Surgery

## 2021-09-26 DIAGNOSIS — M19011 Primary osteoarthritis, right shoulder: Secondary | ICD-10-CM | POA: Diagnosis not present

## 2021-09-26 DIAGNOSIS — M75121 Complete rotator cuff tear or rupture of right shoulder, not specified as traumatic: Secondary | ICD-10-CM | POA: Diagnosis not present

## 2021-09-26 DIAGNOSIS — M25411 Effusion, right shoulder: Secondary | ICD-10-CM | POA: Diagnosis not present

## 2021-10-10 ENCOUNTER — Telehealth: Payer: Self-pay

## 2021-10-10 NOTE — Telephone Encounter (Signed)
Patient is calling in wanting to check up on the surgical clearance form Dr.Roger's office faxed over. They are needing it prior to scheduling patient.

## 2021-10-11 ENCOUNTER — Other Ambulatory Visit: Payer: Self-pay | Admitting: Internal Medicine

## 2021-10-11 NOTE — Telephone Encounter (Signed)
Contacted [facility] to refax surgical clearance form.

## 2021-10-11 NOTE — Telephone Encounter (Signed)
Please refill as per office routine med refill policy (all routine meds to be refilled for 3 mo or monthly (per pt preference) up to one year from last visit, then month to month grace period for 3 mo, then further med refills will have to be denied) ? ?

## 2021-10-31 ENCOUNTER — Ambulatory Visit: Payer: Medicare HMO | Admitting: Internal Medicine

## 2021-11-01 ENCOUNTER — Ambulatory Visit: Payer: Medicare HMO | Admitting: Internal Medicine

## 2021-11-03 ENCOUNTER — Ambulatory Visit (INDEPENDENT_AMBULATORY_CARE_PROVIDER_SITE_OTHER): Payer: Medicare HMO | Admitting: Internal Medicine

## 2021-11-03 VITALS — BP 132/76 | HR 64 | Temp 98.2°F | Ht 71.0 in | Wt 174.0 lb

## 2021-11-03 DIAGNOSIS — E785 Hyperlipidemia, unspecified: Secondary | ICD-10-CM | POA: Diagnosis not present

## 2021-11-03 DIAGNOSIS — R739 Hyperglycemia, unspecified: Secondary | ICD-10-CM

## 2021-11-03 DIAGNOSIS — E559 Vitamin D deficiency, unspecified: Secondary | ICD-10-CM

## 2021-11-03 DIAGNOSIS — Z125 Encounter for screening for malignant neoplasm of prostate: Secondary | ICD-10-CM | POA: Diagnosis not present

## 2021-11-03 DIAGNOSIS — E538 Deficiency of other specified B group vitamins: Secondary | ICD-10-CM | POA: Diagnosis not present

## 2021-11-03 DIAGNOSIS — R410 Disorientation, unspecified: Secondary | ICD-10-CM | POA: Diagnosis not present

## 2021-11-03 DIAGNOSIS — E7800 Pure hypercholesterolemia, unspecified: Secondary | ICD-10-CM

## 2021-11-03 DIAGNOSIS — F5101 Primary insomnia: Secondary | ICD-10-CM

## 2021-11-03 DIAGNOSIS — Z23 Encounter for immunization: Secondary | ICD-10-CM | POA: Diagnosis not present

## 2021-11-03 DIAGNOSIS — R69 Illness, unspecified: Secondary | ICD-10-CM | POA: Diagnosis not present

## 2021-11-03 MED ORDER — ATORVASTATIN CALCIUM 40 MG PO TABS: 40.0000 mg | ORAL_TABLET | Freq: Every day | ORAL | 3 refills | Status: DC

## 2021-11-03 NOTE — Patient Instructions (Addendum)
You had the flu shot today  Ok to change the lipitor to 40 mg per day  Ok to stop the trazodone as it could impair cognition  Ok to take the OTC Melatonin up to 10 mg when needed  Please continue all other medications as before, and refills have been done if requested.  Please have the pharmacy call with any other refills you may need.  Please continue your efforts at being more active, low cholesterol diet, and weight control.  You are otherwise up to date with prevention measures today.  Please keep your appointments with your specialists as you may have planned  Please make an Appointment to return in 6 months, or sooner if needed, also with Lab Appointment for testing done 3-5 days before at the [facility] (so this is for TWO appointments - please see the scheduling desk as you leave)

## 2021-11-03 NOTE — Progress Notes (Unsigned)
Patient ID: Jason Robles, male   DOB: 12-24-1945, 76 y.o.   MRN: [id number]        Chief Complaint: follow up hld, confusion recently,, insomnia       HPI:  Jason Robles is a 76 y.o. male here overall doing ok, Pt denies chest pain, increased sob or doe, wheezing, orthopnea, PND, increased LE swelling, palpitations, dizziness or syncope.   Pt denies polydipsia, polyuria, or new focal neuro s/s.    Pt denies fever, wt loss, night sweats, loss of appetite, or other constitutional symptoms   Taking Vit D and B12.  Scheduled for right shoulder reverse shoulder surgury dec 28.   Also had an episode of memory issues where he spoke the same comments for unclear reason, asked the same questions, and unable to remember a football player   Taking tramadol usually in the evening, then the trazodone at bedtime or 3 am..  Did not want to try the belsomra as of last visit.  Does not want further sleep med for now.  Due for flu shot Wt Readings from Last 3 Encounters:  11/03/21 174 lb (78.9 kg)  07/11/21 174 lb (78.9 kg)  06/28/21 172 lb 12.8 oz (78.4 kg)   BP Readings from Last 3 Encounters:  11/03/21 132/76  07/11/21 (!) 147/90  06/28/21 120/68         Past Medical History:  Diagnosis Date   ABDOMINAL PAIN OTHER SPECIFIED SITE 04/30/2008   ALLERGIC RHINITIS 08/25/2007   Anxiety 06/28/2010   BENIGN PROSTATIC HYPERTROPHY 08/21/2006   Chest pain 06/28/2010   COLONIC POLYPS, HX OF 40/98/1191   Complication of anesthesia    post op nausea and vomiting and wakes up too early per pt   DEGENERATIVE JOINT DISEASE, KNEES, 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   Diverticulosis of colon (without mention of hemorrhage) 04/18/2007   EXTERNAL HEMORRHOIDS 04/18/2007   GERD (gastroesophageal reflux disease)    Headache(784.0) 09/20/2009   hx migraines   HEARING LOSS, 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   HYPERLIPIDEMIA 08/21/2006   INSOMNIA-SLEEP DISORDER-UNSPEC 09/20/2009   NONSPECIFIC ABN FINDING RAD & OTH EXAM GU ORGAN  05/03/2008   PERIPHERAL VASCULAR DISEASE 08/25/2007   PONV (postoperative nausea and vomiting)    Raynaud's syndrome 08/21/2006   Past Surgical History:  Procedure Laterality Date   APPENDECTOMY     COLONOSCOPY  07/25/2006   POLYPECTOMY     s/p knee surgery Bilateral    x's 4 since 1980   s/p left shoulder  2002   rotator cuff   s/p lumbar disc surgery  2007   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 His smoking use included cigarettes. He has a 15.00 pack-year smoking history. He has never been exposed to tobacco smoke. He has never used smokeless tobacco. He reports current alcohol use of about 4.0 standard drinks of alcohol per week. He reports that he does not use drugs. family history includes Alzheimer's disease in his mother; Diabetes in his maternal uncle and mother; Kidney cancer in his brother. Allergies  Allergen Reactions   Iohexol Itching  Itching post 125cc Omni injection. Treated w/ 50 mg PO benedryl.  Onset Date: [id number]    Current Outpatient Medications on File Prior to Visit  Medication Sig Dispense Refill   aspirin 81 MG EC tablet Take 1 tablet (81 mg total) by mouth daily. Swallow whole. 30 tablet 12   butalbital-acetaminophen-caffeine (FIORICET) 50-325-40 MG tablet Take 1-2 tablets by mouth every 6 (six) hours as needed for headache. 20 tablet 0   ketoconazole (NIZORAL) 2 % cream Apply 1 application. topically daily. 30 g 2   meloxicam (MOBIC) 15 MG tablet TAKE 1 TABLET BY MOUTH EVERY DAY 90 tablet 3   methocarbamol (ROBAXIN) 500 MG  tablet Take 1 tablet (500 mg total) by mouth 3 (three) times daily. 90 tablet 2   nitroGLYCERIN (NITROSTAT) 0.4 MG SL tablet Place 1 tablet (0.4 mg total) under the tongue every 5 (five) minutes as needed for chest pain. 25 tablet 3   pantoprazole (PROTONIX) 40 MG tablet TAKE 1 TABLET BY MOUTH EVERY DAY 90 tablet 3   traMADol (ULTRAM) 50 MG tablet Take 1 tablet (50 mg total) by mouth every 12 (twelve) hours as needed. 60 tablet 3   triamcinolone (NASACORT) 55 MCG/ACT AERO nasal inhaler Place 2 sprays into the nose daily. 1 Inhaler 12   Ubrogepant (UBRELVY) 100 MG TABS 1 tab by mouth once per day as needed 30 tablet 5   No current facility-administered medications on file prior to visit.        ROS:  All others reviewed and negative.  Objective        PE:  BP 132/76 (BP Location: Right Arm, Patient Position: Sitting, Cuff Size: Large)   Pulse 64   Temp 98.2 F (36.8 C) (Oral)   Ht '5\' 11"'$  (1.803 m)   Wt 174 lb (78.9 kg)   SpO2 94%   BMI 24.27 kg/m                 Constitutional: Pt appears in NAD               HENT: Head: NCAT.                Right Ear: External ear normal.                 Left Ear: External ear normal.                Eyes: . Pupils are equal, round, and reactive to light. Conjunctivae and EOM are normal               Nose: without d/c or deformity               Neck: Neck supple. Gross normal ROM               Cardiovascular: Normal rate and regular rhythm.                 Pulmonary/Chest: Effort normal and breath sounds without rales or wheezing.                Abd:  Soft, NT, ND, + BS, no organomegaly               Neurological: Pt is alert. At baseline orientation, motor grossly intact               Skin: Skin is warm. No rashes, no other new lesions, LE edema - none  Psychiatric: Pt behavior is normal without agitation   Micro: none  Cardiac tracings I have personally interpreted today:  none  Pertinent Radiological findings (summarize): none    Lab Results  Component Value Date   WBC 6.1 06/06/2021   HGB 13.7 06/06/2021   HCT 40.9 06/06/2021   PLT 168.0 06/06/2021   GLUCOSE 87 06/06/2021   CHOL 175 06/06/2021   TRIG 114.0 06/06/2021   HDL 58.60 06/06/2021   LDLDIRECT 100.0 07/14/2018   LDLCALC 94 06/06/2021   ALT 12 06/06/2021   AST 14 06/06/2021   NA 137 06/06/2021   K 4.3 06/06/2021   CL 100 06/06/2021   CREATININE 0.93 06/06/2021   BUN 18 06/06/2021   CO2 30 06/06/2021   TSH 1.56 06/06/2021   PSA 2.50 06/06/2021   INR 0.89 05/02/2012   HGBA1C 6.1 06/06/2021   Assessment/Plan:  Jason Robles is a 76 y.o. Other or two or more races [6] male with  has a past medical history of ABDOMINAL PAIN OTHER SPECIFIED SITE (04/30/2008), ALLERGIC RHINITIS (08/25/2007), Anxiety (06/28/2010), BENIGN PROSTATIC HYPERTROPHY (08/21/2006), Chest pain (06/28/2010), COLONIC POLYPS, HX OF (96/78/9381), Complication of anesthesia, DEGENERATIVE JOINT DISEASE, KNEES, BILATERAL (08/21/2006), Diverticulosis of colon (without mention of hemorrhage) (04/18/2007), EXTERNAL HEMORRHOIDS (04/18/2007), GERD (gastroesophageal reflux disease), Headache(784.0) (09/20/2009), HEARING LOSS, RIGHT EAR (08/21/2006), HYPERLIPIDEMIA (08/21/2006), INSOMNIA-SLEEP DISORDER-UNSPEC (09/20/2009), NONSPECIFIC ABN FINDING RAD & OTH EXAM GU ORGAN (05/03/2008), PERIPHERAL VASCULAR DISEASE (08/25/2007), PONV (postoperative nausea and vomiting), and Raynaud's syndrome (08/21/2006).  Insomnia D/w pt, mild , ok for melatonin 10 gm qhs prn  Confusion Transient recent, likely related to trazodone and tramadol use together - to d/c trazodone  Dyslipidemia Lab Results  Component Value Date   LDLCALC 94 06/06/2021   Uncontrolled, goal ldl < 70, for increased lipitor 40 mg qd, lower chol diet  Followup: Return in about 6 months (around 05/04/2022).  Cathlean Cower, MD 11/05/2021 7:18 PM [facility]

## 2021-11-05 ENCOUNTER — Encounter: Payer: Self-pay | Admitting: Internal Medicine

## 2021-11-05 DIAGNOSIS — R410 Disorientation, unspecified: Secondary | ICD-10-CM | POA: Insufficient documentation

## 2021-11-05 NOTE — Assessment & Plan Note (Signed)
D/w pt, mild , ok for melatonin 10 gm qhs prn

## 2021-11-05 NOTE — Assessment & Plan Note (Signed)
Transient recent, likely related to trazodone and tramadol use together - to d/c trazodone

## 2021-11-05 NOTE — Assessment & Plan Note (Signed)
Lab Results  Component Value Date   LDLCALC 94 06/06/2021   Uncontrolled, goal ldl < 70, for increased lipitor 40 mg qd, lower chol diet

## 2021-11-28 NOTE — Addendum Note (Signed)
Addended by: Max Sane on: 11/28/2021 09:56 AM   Modules accepted: Orders

## 2021-12-15 ENCOUNTER — Ambulatory Visit: Payer: Medicare HMO | Admitting: Internal Medicine

## 2021-12-15 DIAGNOSIS — M25531 Pain in right wrist: Secondary | ICD-10-CM | POA: Diagnosis not present

## 2021-12-15 DIAGNOSIS — M67431 Ganglion, right wrist: Secondary | ICD-10-CM | POA: Diagnosis not present

## 2021-12-17 DIAGNOSIS — M25531 Pain in right wrist: Secondary | ICD-10-CM | POA: Diagnosis not present

## 2021-12-18 NOTE — Progress Notes (Signed)
Anesthesia Review:  PCP: Cardiologist : Chest x-ray : EKG : Echo : Stress test: Cardiac Cath :  Activity level:  Sleep Study/ CPAP : Fasting Blood Sugar :      / Checks Blood Sugar -- times a day:   Blood Thinner/ Instructions /Last Dose: ASA / Instructions/ Last Dose :    81 mg aspirin

## 2021-12-18 NOTE — Progress Notes (Deleted)
[facility]- Preparing for Total Shoulder Arthroplasty    Before surgery, you can play an important role. Because skin is not sterile, your skin needs to be as free of germs as possible. You can reduce the number of germs on your skin by using the following products. Benzoyl Peroxide Gel Reduces the number of germs present on the skin Applied twice a day to shoulder area starting two days before surgery    ==================================================================  Please follow these instructions carefully:  BENZOYL PEROXIDE 5% GEL  Please do not use if you have an allergy to benzoyl peroxide.   If your skin becomes reddened/irritated stop using the benzoyl peroxide.  Starting two days before surgery, apply as follows: Apply benzoyl peroxide in the morning and at night. Apply after taking a shower. If you are not taking a shower clean entire shoulder front, back, and side along with the armpit with a clean wet washcloth.  Place a quarter-sized dollop on your shoulder and rub in thoroughly, making sure to cover the front, back, and side of your shoulder, along with the armpit.   2 days before ____ AM   ____ PM              1 day before ____ AM   ____ PM                         Do this twice a day for two days.  (Last application is the night before surgery, AFTER using the CHG soap as described below).  Do NOT apply benzoyl peroxide gel on the day of surgery.SURGICAL WAITING ROOM VISITATION Patients having surgery or a procedure may have no more than 2 support people in the waiting area - these visitors may rotate.   Children under the age of 42 must have an adult with them who is not the patient. If the patient needs to stay at the hospital during part of their recovery, the visitor guidelines for inpatient rooms apply. Pre-op nurse will coordinate an appropriate time for 1 support person to accompany patient in pre-op.  This support person may not rotate.    Please refer to  the [facility] website for the visitor guidelines for Inpatients (after your surgery is over and you are in a regular room).       Your procedure is scheduled on:  12/28/2021    Report to [facility] Main Entrance    Report to admitting at   [phone]   Call this number if you have problems the morning of surgery [phone]   Do not eat food :After Midnight.   After Midnight you may have the following liquids until ___ 0415___ AM DAY OF SURGERY  Water Non-Citrus Juices (without pulp, NO RED) Carbonated Beverages Black Coffee (NO MILK/CREAM OR CREAMERS, sugar ok)  Clear Tea (NO MILK/CREAM OR CREAMERS, sugar ok) regular and decaf                             Plain Jell-O (NO RED)                                           Fruit ices (not with fruit pulp, NO RED)  Popsicles (NO RED)                                                               Sports drinks like Gatorade (NO RED)                     The day of surgery:  Drink ONE (1) Pre-Surgery Clear Ensure or G2 at   0415 AM ( have completed by )  morning of surgery. Drink in one sitting. Do not sip.  This drink was given to you during your hospital  pre-op appointment visit. Nothing else to drink after completing the  Pre-Surgery Clear Ensure or G2.          If you have questions, please contact your surgeon's office.        Oral Hygiene is also important to reduce your risk of infection.                                    Remember - BRUSH YOUR TEETH THE MORNING OF SURGERY WITH YOUR REGULAR TOOTHPASTE  DENTURES WILL BE REMOVED PRIOR TO SURGERY PLEASE DO NOT APPLY "Poly grip" OR ADHESIVES!!!   Do NOT smoke after Midnight   Take these medicines the morning of surgery with A SIP OF WATER:  protonix   DO NOT TAKE ANY ORAL DIABETIC MEDICATIONS DAY OF YOUR SURGERY  Bring CPAP mask and tubing day of surgery.                              You may not have any metal on your body  including hair pins, jewelry, and body piercing             Do not wear make-up, lotions, powders, perfumes/cologne, or deodorant  Do not wear nail polish including gel and S&S, artificial/acrylic nails, or any other type of covering on natural nails including finger and toenails. If you have artificial nails, gel coating, etc. that needs to be removed by a nail salon please have this removed prior to surgery or surgery may need to be canceled/ delayed if the surgeon/ anesthesia feels like they are unable to be safely monitored.   Do not shave  48 hours prior to surgery.               Men may shave face and neck.   Do not bring valuables to the hospital. [place].   Contacts, glasses, dentures or bridgework may not be worn into surgery.   Bring small overnight bag day of surgery.   DO NOT [place]. PHARMACY WILL DISPENSE MEDICATIONS LISTED ON YOUR MEDICATION LIST TO YOU DURING YOUR ADMISSION [facility]!    Patients discharged on the day of surgery will not be allowed to drive home.  Someone NEEDS to stay with you for the first 24 hours after anesthesia.   Special Instructions: Bring a copy of your healthcare power of attorney and living will documents the day of surgery if you haven't  scanned them before.              Please read over the following fact sheets you were given: IF [place] [phone]   If you received a COVID test during your pre-op visit  it is requested that you wear a mask when out in public, stay away from anyone that may not be feeling well and notify your surgeon if you develop symptoms. If you test positive for Covid or have been in contact with anyone that has tested positive in the last 10 days please notify you surgeon.    [place] [facility] - Preparing for Surgery Before surgery, you can play an important role.  Because skin  is not sterile, your skin needs to be as free of germs as possible.  You can reduce the number of germs on your skin by washing with CHG (chlorahexidine gluconate) soap before surgery.  CHG is an antiseptic cleaner which kills germs and bonds with the skin to continue killing germs even after washing. Please DO NOT use if you have an allergy to CHG or antibacterial soaps.  If your skin becomes reddened/irritated stop using the CHG and inform your nurse when you arrive at Short Stay. Do not shave (including legs and underarms) for at least 48 hours prior to the first CHG shower.  You may shave your face/neck. Please follow these instructions carefully:  1.  Shower with CHG Soap the night before surgery and the  morning of Surgery.  2.  If you choose to wash your hair, wash your hair first as usual with your  normal  shampoo.  3.  After you shampoo, rinse your hair and body thoroughly to remove the  shampoo.                           4.  Use CHG as you would any other liquid soap.  You can apply chg directly  to the skin and wash                       Gently with a scrungie or clean washcloth.  5.  Apply the CHG Soap to your body ONLY FROM THE NECK DOWN.   Do not use on face/ open                           Wound or open sores. Avoid contact with eyes, ears mouth and genitals (private parts).                       Wash face,  Genitals (private parts) with your normal soap.             6.  Wash thoroughly, paying special attention to the area where your surgery  will be performed.  7.  Thoroughly rinse your body with warm water from the neck down.  8.  DO NOT shower/wash with your normal soap after using and rinsing off  the CHG Soap.                9.  Pat yourself dry with a clean towel.            10.  Wear clean pajamas.            11.  Place clean sheets on your bed the night of your first  shower and do not  sleep with pets. Day of Surgery : Do not apply any lotions/deodorants the morning of  surgery.  Please wear clean clothes to the hospital/surgery center.  FAILURE TO FOLLOW THESE INSTRUCTIONS MAY RESULT IN THE CANCELLATION OF YOUR SURGERY PATIENT SIGNATURE_________________________________  NURSE SIGNATURE__________________________________  ________________________________________________________________________

## 2021-12-19 ENCOUNTER — Encounter (HOSPITAL_COMMUNITY)
Admission: RE | Admit: 2021-12-19 | Discharge: 2021-12-19 | Disposition: A | Discharge status: Home / Self Care | Payer: Medicare HMO | Source: Ambulatory Visit | Attending: Orthopedic Surgery | Admitting: Orthopedic Surgery

## 2021-12-19 ENCOUNTER — Other Ambulatory Visit: Payer: Self-pay

## 2021-12-19 ENCOUNTER — Encounter (HOSPITAL_COMMUNITY): Payer: Self-pay

## 2021-12-19 VITALS — BP 130/92 | HR 68 | Temp 98.2°F | Resp 16 | Ht 71.0 in | Wt 168.0 lb

## 2021-12-19 DIAGNOSIS — Z01812 Encounter for preprocedural laboratory examination: Secondary | ICD-10-CM | POA: Insufficient documentation

## 2021-12-19 DIAGNOSIS — Z01818 Encounter for other preprocedural examination: Secondary | ICD-10-CM

## 2021-12-19 LAB — CBC
HCT: 43 % (ref 39.0–52.0)
Hemoglobin: 14.3 g/dL (ref 13.0–17.0)
MCH: 29.2 pg (ref 26.0–34.0)
MCHC: 33.3 g/dL (ref 30.0–36.0)
MCV: 87.8 fL (ref 80.0–100.0)
Platelets: 196 10*3/uL (ref 150–400)
RBC: 4.9 MIL/uL (ref 4.22–5.81)
RDW: 13.2 % (ref 11.5–15.5)
WBC: 6.1 10*3/uL (ref 4.0–10.5)
nRBC: 0 % (ref 0.0–0.2)

## 2021-12-19 LAB — SURGICAL PCR SCREEN
MRSA, PCR: NEGATIVE
Staphylococcus aureus: NEGATIVE

## 2021-12-19 NOTE — Patient Instructions (Signed)
SURGICAL WAITING ROOM VISITATION Patients having surgery or a procedure may have no more than 2 support people in the waiting area - these visitors may rotate.   Children under the age of 21 must have an adult with them who is not the patient. If the patient needs to stay at the hospital during part of their recovery, the visitor guidelines for inpatient rooms apply. Pre-op nurse will coordinate an appropriate time for 1 support person to accompany patient in pre-op.  This support person may not rotate.    Please refer to the [facility] website for the visitor guidelines for Inpatients (after your surgery is over and you are in a regular room).       Your procedure is scheduled on: 12/28/21    Report to [facility] Main Entrance    Report to admitting at  [place] AM   Call this number if you have problems the morning of surgery [phone]   Do not eat food :After Midnight.   After Midnight you may have the following liquids until ___ 0415___ AM  DAY OF SURGERY  Water Non-Citrus Juices (without pulp, NO RED) Carbonated Beverages Black Coffee (NO MILK/CREAM OR CREAMERS, sugar ok)  Clear Tea (NO MILK/CREAM OR CREAMERS, sugar ok) regular and decaf                             Plain Jell-O (NO RED)                                           Fruit ices (not with fruit pulp, NO RED)                                     Popsicles (NO RED)                                                               Sports drinks like Gatorade (NO RED)                    The day of surgery:  Drink ONE (1) Pre-Surgery Clear Ensure or G2 at 0415 AM ( have completed by )  the morning of surgery. Drink in one sitting. Do not sip.  This drink was given to you during your hospital  pre-op appointment visit. Nothing else to drink after completing the  Pre-Surgery Clear Ensure or G2.          If you have questions, please contact your surgeon's office.       Oral Hygiene is also important to  reduce your risk of infection.                                    Remember - BRUSH YOUR TEETH THE MORNING OF SURGERY WITH YOUR REGULAR TOOTHPASTE  DENTURES WILL BE REMOVED PRIOR TO SURGERY PLEASE DO NOT APPLY "Poly grip" OR ADHESIVES!!!   Do NOT smoke after Midnight   Take these medicines the  morning of surgery with A SIP OF WATER:  protonix   DO NOT TAKE ANY ORAL DIABETIC MEDICATIONS DAY OF YOUR SURGERY  Bring CPAP mask and tubing day of surgery.                              You may not have any metal on your body including hair pins, jewelry, and body piercing             Do not wear make-up, lotions, powders, perfumes/cologne, or deodorant  Do not wear nail polish including gel and S&S, artificial/acrylic nails, or any other type of covering on natural nails including finger and toenails. If you have artificial nails, gel coating, etc. that needs to be removed by a nail salon please have this removed prior to surgery or surgery may need to be canceled/ delayed if the surgeon/ anesthesia feels like they are unable to be safely monitored.   Do not shave  48 hours prior to surgery.               Men may shave face and neck.   Do not bring valuables to the hospital. [place].   Contacts, glasses, dentures or bridgework may not be worn into surgery.   Bring small overnight bag day of surgery.   DO NOT [place]. PHARMACY WILL DISPENSE MEDICATIONS LISTED ON YOUR MEDICATION LIST TO YOU DURING YOUR ADMISSION [place]!    Patients discharged on the day of surgery will not be allowed to drive home.  Someone NEEDS to stay with you for the first 24 hours after anesthesia.   Special Instructions: Bring a copy of your healthcare power of attorney and living will documents the day of surgery if you haven't scanned them before.              Please read over the following fact sheets you were given: IF  [place] [phone]   If you received a COVID test during your pre-op visit  it is requested that you wear a mask when out in public, stay away from anyone that may not be feeling well and notify your surgeon if you develop symptoms. If you test positive for Covid or have been in contact with anyone that has tested positive in the last 10 days please notify you surgeon.    [place] - Preparing for Surgery Before surgery, you can play an important role.  Because skin is not sterile, your skin needs to be as free of germs as possible.  You can reduce the number of germs on your skin by washing with CHG (chlorahexidine gluconate) soap before surgery.  CHG is an antiseptic cleaner which kills germs and bonds with the skin to continue killing germs even after washing. Please DO NOT use if you have an allergy to CHG or antibacterial soaps.  If your skin becomes reddened/irritated stop using the CHG and inform your nurse when you arrive at Short Stay. Do not shave (including legs and underarms) for at least 48 hours prior to the first CHG shower.  You may shave your face/neck. Please follow these instructions carefully:  1.  Shower with CHG Soap the night before surgery and the  morning of Surgery.  2.  If  you choose to wash your hair, wash your hair first as usual with your  normal  shampoo.  3.  After you shampoo, rinse your hair and body thoroughly to remove the  shampoo.                           4.  Use CHG as you would any other liquid soap.  You can apply chg directly  to the skin and wash                       Gently with a scrungie or clean washcloth.  5.  Apply the CHG Soap to your body ONLY FROM THE NECK DOWN.   Do not use on face/ open                           Wound or open sores. Avoid contact with eyes, ears mouth and genitals (private parts).                       Wash face,  Genitals (private parts) with your normal soap.              6.  Wash thoroughly, paying special attention to the area where your surgery  will be performed.  7.  Thoroughly rinse your body with warm water from the neck down.  8.  DO NOT shower/wash with your normal soap after using and rinsing off  the CHG Soap.                9.  Pat yourself dry with a clean towel.            10.  Wear clean pajamas.            11.  Place clean sheets on your bed the night of your first shower and do not  sleep with pets. Day of Surgery : Do not apply any lotions/deodorants the morning of surgery.  Please wear clean clothes to the hospital/surgery center.  FAILURE TO FOLLOW THESE INSTRUCTIONS MAY RESULT IN THE CANCELLATION OF YOUR SURGERY PATIENT SIGNATURE_________________________________  NURSE SIGNATURE__________________________________  ________________________________________________________________________[facility]- Preparing for Total Shoulder Arthroplasty    Before surgery, you can play an important role. Because skin is not sterile, your skin needs to be as free of germs as possible. You can reduce the number of germs on your skin by using the following products. Benzoyl Peroxide Gel Reduces the number of germs present on the skin Applied twice a day to shoulder area starting two days before surgery    ==================================================================  Please follow these instructions carefully:  BENZOYL PEROXIDE 5% GEL  Please do not use if you have an allergy to benzoyl peroxide.   If your skin becomes reddened/irritated stop using the benzoyl peroxide.  Starting two days before surgery, apply as follows: Apply benzoyl peroxide in the morning and at night. Apply after taking a shower. If you are not taking a shower clean entire shoulder front, back, and side along with the armpit with a clean wet washcloth.  Place a quarter-sized dollop on your shoulder and rub in thoroughly, making sure to cover the front, back, and side of  your shoulder, along with the armpit.   2 days before ____ AM   ____ PM              1 day before  ____ AM   ____ PM                         Do this twice a day for two days.  (Last application is the night before surgery, AFTER using the CHG soap as described below).  Do NOT apply benzoyl peroxide gel on the day of surgery.

## 2021-12-27 NOTE — Anesthesia Preprocedure Evaluation (Signed)
Anesthesia Evaluation  Patient identified by MRN, date of birth, ID band Patient awake    Reviewed: Allergy & Precautions, NPO status , Patient's Chart, lab work & pertinent test results  Airway Mallampati: II  TM Distance: >3 FB Neck ROM: Full    Dental no notable dental hx. (+) Teeth Intact, Dental Advisory Given   Pulmonary former smoker   Pulmonary exam normal        Cardiovascular + Peripheral Vascular Disease  negative cardio ROS  Rhythm:Regular Rate:Normal     Neuro/Psych  Headaches  Anxiety     TIA   GI/Hepatic Neg liver ROS,GERD  Medicated,,  Endo/Other  negative endocrine ROS    Renal/GU   negative genitourinary   Musculoskeletal  (+) Arthritis , Osteoarthritis,    Abdominal Normal abdominal exam  (+)   Peds  Hematology negative hematology ROS (+)   Anesthesia Other Findings   Reproductive/Obstetrics                              Anesthesia Physical Anesthesia Plan  ASA: 2  Anesthesia Plan: General and Regional   Post-op Pain Management: Regional block*   Induction: Intravenous  PONV Risk Score and Plan: 2 and Ondansetron, Dexamethasone and Treatment may vary due to age or medical condition  Airway Management Planned: Mask and Oral ETT  Additional Equipment: None  Intra-op Plan:   Post-operative Plan: Extubation in OR  Informed Consent: I have reviewed the patients History and Physical, chart, labs and discussed the procedure including the risks, benefits and alternatives for the proposed anesthesia with the patient or authorized representative who has indicated his/her understanding and acceptance.     Dental advisory given  Plan Discussed with: CRNA  Anesthesia Plan Comments: (Lab Results      Component                Value               Date                      WBC                      6.1                 12/19/2021                HGB                       14.3                12/19/2021                HCT                      43.0                12/19/2021                MCV                      87.8                12/19/2021                PLT  196                 12/19/2021             Lab Results      Component                Value               Date                      NA                       137                 06/06/2021                K                        4.3                 06/06/2021                CO2                      30                  06/06/2021                GLUCOSE                  87                  06/06/2021                BUN                      18                  06/06/2021                CREATININE               0.93                06/06/2021                CALCIUM                  9.7                 06/06/2021                GFRNONAA                 83                  10/22/2019           )        Anesthesia Quick Evaluation

## 2021-12-28 ENCOUNTER — Other Ambulatory Visit: Payer: Self-pay

## 2021-12-28 ENCOUNTER — Ambulatory Visit (HOSPITAL_BASED_OUTPATIENT_CLINIC_OR_DEPARTMENT_OTHER): Payer: Medicare HMO | Admitting: Anesthesiology

## 2021-12-28 ENCOUNTER — Observation Stay (HOSPITAL_COMMUNITY)
Admission: RE | Admit: 2021-12-28 | Discharge: 2021-12-29 | Disposition: A | Discharge status: Home / Self Care | Payer: Medicare HMO | Source: Ambulatory Visit | Attending: Orthopedic Surgery | Admitting: Orthopedic Surgery

## 2021-12-28 ENCOUNTER — Encounter (HOSPITAL_COMMUNITY): Payer: Self-pay | Admitting: Orthopedic Surgery

## 2021-12-28 ENCOUNTER — Encounter (HOSPITAL_COMMUNITY)
Admission: RE | Disposition: A | Discharge status: Home / Self Care | Payer: Self-pay | Source: Ambulatory Visit | Attending: Orthopedic Surgery

## 2021-12-28 ENCOUNTER — Ambulatory Visit (HOSPITAL_COMMUNITY): Payer: Medicare HMO | Admitting: Physician Assistant

## 2021-12-28 ENCOUNTER — Ambulatory Visit (HOSPITAL_COMMUNITY): Payer: Medicare HMO

## 2021-12-28 DIAGNOSIS — Z01818 Encounter for other preprocedural examination: Secondary | ICD-10-CM

## 2021-12-28 DIAGNOSIS — Z87891 Personal history of nicotine dependence: Secondary | ICD-10-CM | POA: Insufficient documentation

## 2021-12-28 DIAGNOSIS — Z7982 Long term (current) use of aspirin: Secondary | ICD-10-CM | POA: Diagnosis not present

## 2021-12-28 DIAGNOSIS — M19011 Primary osteoarthritis, right shoulder: Principal | ICD-10-CM | POA: Insufficient documentation

## 2021-12-28 DIAGNOSIS — Z96653 Presence of artificial knee joint, bilateral: Secondary | ICD-10-CM | POA: Diagnosis not present

## 2021-12-28 DIAGNOSIS — M25711 Osteophyte, right shoulder: Secondary | ICD-10-CM

## 2021-12-28 DIAGNOSIS — Z96611 Presence of right artificial shoulder joint: Secondary | ICD-10-CM | POA: Diagnosis not present

## 2021-12-28 DIAGNOSIS — G8918 Other acute postprocedural pain: Secondary | ICD-10-CM | POA: Diagnosis not present

## 2021-12-28 DIAGNOSIS — Z79899 Other long term (current) drug therapy: Secondary | ICD-10-CM | POA: Diagnosis not present

## 2021-12-28 DIAGNOSIS — Z471 Aftercare following joint replacement surgery: Secondary | ICD-10-CM | POA: Diagnosis not present

## 2021-12-28 HISTORY — PX: REVERSE SHOULDER ARTHROPLASTY: SHX5054

## 2021-12-28 HISTORY — DX: Presence of right artificial shoulder joint: Z96.611

## 2021-12-28 SURGERY — ARTHROPLASTY, SHOULDER, TOTAL, REVERSE
Anesthesia: Regional | Site: Shoulder | Laterality: Right

## 2021-12-28 MED ORDER — NITROGLYCERIN 0.4 MG SL SUBL: 0.4000 mg | SUBLINGUAL_TABLET | SUBLINGUAL | Status: DC | PRN

## 2021-12-28 MED ORDER — BUPIVACAINE HCL (PF) 0.5 % IJ SOLN
INTRAMUSCULAR | Status: DC | PRN
  Administered 2021-12-28: 15 mL via PERINEURAL

## 2021-12-28 MED ORDER — DOCUSATE SODIUM 100 MG PO CAPS
100.0000 mg | ORAL_CAPSULE | Freq: Two times a day (BID) | ORAL | Status: DC
  Administered 2021-12-28 – 2021-12-29 (×3): 100 mg via ORAL
  Filled 2021-12-28 (×3): qty 1

## 2021-12-28 MED ORDER — ACETAMINOPHEN 500 MG PO TABS
500.0000 mg | ORAL_TABLET | Freq: Four times a day (QID) | ORAL | Status: AC
  Administered 2021-12-28 – 2021-12-29 (×4): 500 mg via ORAL
  Filled 2021-12-28 (×4): qty 1

## 2021-12-28 MED ORDER — PHENYLEPHRINE HCL-NACL 20-0.9 MG/250ML-% IV SOLN
INTRAVENOUS | Status: DC | PRN
  Administered 2021-12-28: 40 ug/min via INTRAVENOUS

## 2021-12-28 MED ORDER — 0.9 % SODIUM CHLORIDE (POUR BTL) OPTIME
TOPICAL | Status: DC | PRN
  Administered 2021-12-28: 1000 mL

## 2021-12-28 MED ORDER — ACETAMINOPHEN 10 MG/ML IV SOLN: 1000.0000 mg | Freq: Once | INTRAVENOUS | Status: DC | PRN

## 2021-12-28 MED ORDER — ISOPROPYL ALCOHOL 70 % SOLN
Status: AC
  Filled 2021-12-28: qty 480

## 2021-12-28 MED ORDER — STERILE WATER FOR IRRIGATION IR SOLN
Status: DC | PRN
  Administered 2021-12-28: 2000 mL

## 2021-12-28 MED ORDER — FENTANYL CITRATE PF 50 MCG/ML IJ SOSY
25.0000 ug | PREFILLED_SYRINGE | INTRAMUSCULAR | Status: DC | PRN
  Administered 2021-12-28 (×2): 50 ug via INTRAVENOUS

## 2021-12-28 MED ORDER — CHLORHEXIDINE GLUCONATE 0.12 % MT SOLN
15.0000 mL | Freq: Once | OROMUCOSAL | Status: AC
  Administered 2021-12-28: 15 mL via OROMUCOSAL

## 2021-12-28 MED ORDER — PROPOFOL 10 MG/ML IV BOLUS
INTRAVENOUS | Status: AC
  Filled 2021-12-28: qty 20

## 2021-12-28 MED ORDER — TRANEXAMIC ACID-NACL 1000-0.7 MG/100ML-% IV SOLN
1000.0000 mg | INTRAVENOUS | Status: AC
  Administered 2021-12-28: 1000 mg via INTRAVENOUS
  Filled 2021-12-28: qty 100

## 2021-12-28 MED ORDER — ROCURONIUM BROMIDE 10 MG/ML (PF) SYRINGE
PREFILLED_SYRINGE | INTRAVENOUS | Status: DC | PRN
  Administered 2021-12-28: 60 mg via INTRAVENOUS

## 2021-12-28 MED ORDER — METOCLOPRAMIDE HCL 5 MG PO TABS: 5.0000 mg | ORAL_TABLET | Freq: Three times a day (TID) | ORAL | Status: DC | PRN

## 2021-12-28 MED ORDER — METHOCARBAMOL 500 MG PO TABS
500.0000 mg | ORAL_TABLET | Freq: Four times a day (QID) | ORAL | Status: DC | PRN
  Administered 2021-12-28: 500 mg via ORAL
  Filled 2021-12-28: qty 1

## 2021-12-28 MED ORDER — FENTANYL CITRATE (PF) 250 MCG/5ML IJ SOLN
INTRAMUSCULAR | Status: AC
  Filled 2021-12-28: qty 5

## 2021-12-28 MED ORDER — LACTATED RINGERS IV SOLN: INTRAVENOUS | Status: DC

## 2021-12-28 MED ORDER — MORPHINE SULFATE (PF) 2 MG/ML IV SOLN: 0.5000 mg | INTRAVENOUS | Status: DC | PRN

## 2021-12-28 MED ORDER — MIDAZOLAM HCL 5 MG/5ML IJ SOLN
INTRAMUSCULAR | Status: DC | PRN
  Administered 2021-12-28: 1 mg via INTRAVENOUS

## 2021-12-28 MED ORDER — METHOCARBAMOL 500 MG IVPB - SIMPLE MED
INTRAVENOUS | Status: AC
  Filled 2021-12-28: qty 55

## 2021-12-28 MED ORDER — BUPIVACAINE LIPOSOME 1.3 % IJ SUSP
INTRAMUSCULAR | Status: DC | PRN
  Administered 2021-12-28: 10 mL via PERINEURAL

## 2021-12-28 MED ORDER — MIDAZOLAM HCL 2 MG/2ML IJ SOLN
INTRAMUSCULAR | Status: AC
  Filled 2021-12-28: qty 2

## 2021-12-28 MED ORDER — HYDROCODONE-ACETAMINOPHEN 5-325 MG PO TABS: 1.0000 | ORAL_TABLET | ORAL | Status: DC | PRN

## 2021-12-28 MED ORDER — SUGAMMADEX SODIUM 200 MG/2ML IV SOLN
INTRAVENOUS | Status: DC | PRN
  Administered 2021-12-28: 200 mg via INTRAVENOUS

## 2021-12-28 MED ORDER — PHENOL 1.4 % MT LIQD: 1.0000 | OROMUCOSAL | Status: DC | PRN

## 2021-12-28 MED ORDER — VANCOMYCIN HCL 1000 MG IV SOLR
INTRAVENOUS | Status: AC
  Filled 2021-12-28: qty 20

## 2021-12-28 MED ORDER — ASPIRIN 81 MG PO TBEC
81.0000 mg | DELAYED_RELEASE_TABLET | Freq: Every day | ORAL | Status: DC
  Administered 2021-12-28 – 2021-12-29 (×2): 81 mg via ORAL
  Filled 2021-12-28 (×2): qty 1

## 2021-12-28 MED ORDER — ONDANSETRON HCL 4 MG/2ML IJ SOLN: 4.0000 mg | Freq: Four times a day (QID) | INTRAMUSCULAR | Status: DC | PRN

## 2021-12-28 MED ORDER — VANCOMYCIN HCL 1000 MG IV SOLR
INTRAVENOUS | Status: DC | PRN
  Administered 2021-12-28: 1000 mg

## 2021-12-28 MED ORDER — DEXAMETHASONE SODIUM PHOSPHATE 10 MG/ML IJ SOLN
INTRAMUSCULAR | Status: DC | PRN
  Administered 2021-12-28: 8 mg via INTRAVENOUS

## 2021-12-28 MED ORDER — ONDANSETRON HCL 4 MG PO TABS: 4.0000 mg | ORAL_TABLET | Freq: Four times a day (QID) | ORAL | Status: DC | PRN

## 2021-12-28 MED ORDER — FENTANYL CITRATE PF 50 MCG/ML IJ SOSY
PREFILLED_SYRINGE | INTRAMUSCULAR | Status: AC
  Filled 2021-12-28: qty 1

## 2021-12-28 MED ORDER — LIDOCAINE HCL (PF) 2 % IJ SOLN
INTRAMUSCULAR | Status: AC
  Filled 2021-12-28: qty 5

## 2021-12-28 MED ORDER — ATORVASTATIN CALCIUM 40 MG PO TABS
40.0000 mg | ORAL_TABLET | Freq: Every day | ORAL | Status: DC
  Administered 2021-12-28 – 2021-12-29 (×2): 40 mg via ORAL
  Filled 2021-12-28 (×2): qty 1

## 2021-12-28 MED ORDER — TRANEXAMIC ACID-NACL 1000-0.7 MG/100ML-% IV SOLN
1000.0000 mg | Freq: Once | INTRAVENOUS | Status: AC
  Administered 2021-12-28: 1000 mg via INTRAVENOUS
  Filled 2021-12-28: qty 100

## 2021-12-28 MED ORDER — MENTHOL 3 MG MT LOZG: 1.0000 | LOZENGE | OROMUCOSAL | Status: DC | PRN

## 2021-12-28 MED ORDER — ACETAMINOPHEN 325 MG PO TABS: 325.0000 mg | ORAL_TABLET | Freq: Four times a day (QID) | ORAL | Status: DC | PRN

## 2021-12-28 MED ORDER — DEXAMETHASONE SODIUM PHOSPHATE 10 MG/ML IJ SOLN
INTRAMUSCULAR | Status: AC
  Filled 2021-12-28: qty 1

## 2021-12-28 MED ORDER — METHOCARBAMOL 500 MG IVPB - SIMPLE MED
500.0000 mg | Freq: Four times a day (QID) | INTRAVENOUS | Status: DC | PRN
  Administered 2021-12-28: 500 mg via INTRAVENOUS

## 2021-12-28 MED ORDER — CEFAZOLIN SODIUM-DEXTROSE 1-4 GM/50ML-% IV SOLN
1.0000 g | Freq: Four times a day (QID) | INTRAVENOUS | Status: AC
  Administered 2021-12-28 – 2021-12-29 (×3): 1 g via INTRAVENOUS
  Filled 2021-12-28 (×3): qty 50

## 2021-12-28 MED ORDER — PROPOFOL 10 MG/ML IV BOLUS
INTRAVENOUS | Status: DC | PRN
  Administered 2021-12-28: 160 mg via INTRAVENOUS

## 2021-12-28 MED ORDER — HYDROCODONE-ACETAMINOPHEN 7.5-325 MG PO TABS
1.0000 | ORAL_TABLET | ORAL | Status: DC | PRN
  Administered 2021-12-28: 1 via ORAL
  Filled 2021-12-28: qty 1

## 2021-12-28 MED ORDER — PANTOPRAZOLE SODIUM 40 MG PO TBEC
40.0000 mg | DELAYED_RELEASE_TABLET | Freq: Every day | ORAL | Status: DC
  Administered 2021-12-28 – 2021-12-29 (×2): 40 mg via ORAL
  Filled 2021-12-28 (×2): qty 1

## 2021-12-28 MED ORDER — LIDOCAINE 2% (20 MG/ML) 5 ML SYRINGE
INTRAMUSCULAR | Status: DC | PRN
  Administered 2021-12-28: 60 mg via INTRAVENOUS

## 2021-12-28 MED ORDER — PHENYLEPHRINE 80 MCG/ML (10ML) SYRINGE FOR IV PUSH (FOR BLOOD PRESSURE SUPPORT)
PREFILLED_SYRINGE | INTRAVENOUS | Status: DC | PRN
  Administered 2021-12-28 (×2): 160 ug via INTRAVENOUS

## 2021-12-28 MED ORDER — VITAMIN B-12 1000 MCG PO TABS
500.0000 ug | ORAL_TABLET | Freq: Every day | ORAL | Status: DC
  Administered 2021-12-28 – 2021-12-29 (×2): 500 ug via ORAL
  Filled 2021-12-28 (×2): qty 1

## 2021-12-28 MED ORDER — PHENYLEPHRINE 80 MCG/ML (10ML) SYRINGE FOR IV PUSH (FOR BLOOD PRESSURE SUPPORT)
PREFILLED_SYRINGE | INTRAVENOUS | Status: AC
  Filled 2021-12-28: qty 10

## 2021-12-28 MED ORDER — ONDANSETRON HCL 4 MG/2ML IJ SOLN
INTRAMUSCULAR | Status: DC | PRN
  Administered 2021-12-28: 4 mg via INTRAVENOUS

## 2021-12-28 MED ORDER — FENTANYL CITRATE (PF) 250 MCG/5ML IJ SOLN
INTRAMUSCULAR | Status: DC | PRN
  Administered 2021-12-28 (×2): 50 ug via INTRAVENOUS

## 2021-12-28 MED ORDER — CEFAZOLIN SODIUM-DEXTROSE 2-4 GM/100ML-% IV SOLN
2.0000 g | INTRAVENOUS | Status: AC
  Administered 2021-12-28: 2 g via INTRAVENOUS
  Filled 2021-12-28: qty 100

## 2021-12-28 MED ORDER — ORAL CARE MOUTH RINSE: 15.0000 mL | Freq: Once | OROMUCOSAL | Status: AC

## 2021-12-28 MED ORDER — ONDANSETRON HCL 4 MG/2ML IJ SOLN
INTRAMUSCULAR | Status: AC
  Filled 2021-12-28: qty 2

## 2021-12-28 MED ORDER — METOCLOPRAMIDE HCL 5 MG/ML IJ SOLN: 5.0000 mg | Freq: Three times a day (TID) | INTRAMUSCULAR | Status: DC | PRN

## 2021-12-28 SURGICAL SUPPLY — 75 items
AID PSTN UNV HD RSTRNT DISP (MISCELLANEOUS) ×1
BAG COUNTER SPONGE SURGICOUNT (BAG) IMPLANT
BAG SPEC THK2 15X12 ZIP CLS (MISCELLANEOUS) ×1
BAG SPNG CNTER NS LX DISP (BAG) ×1
BAG ZIPLOCK 12X15 (MISCELLANEOUS) ×1 IMPLANT
BEARING HUMERAL 40 STD VITE (Joint) IMPLANT
BIT DRILL 2.7 W/STOP DISP (BIT) IMPLANT
BIT DRILL TWIST 2.7 (BIT) IMPLANT
BLADE SAG 18X100X1.27 (BLADE) ×1 IMPLANT
BRNG HUM STD 40 RVRS SHLDR (Joint) ×1 IMPLANT
BSPLAT GLND LRG AUG TPR ADPR (Joint) ×1 IMPLANT
CEMENT BONE DEPUY (Cement) IMPLANT
CLSR STERI-STRIP ANTIMIC 1/2X4 (GAUZE/BANDAGES/DRESSINGS) IMPLANT
COMP REV AUG LG W/TAPER/GLENOI (Joint) ×1 IMPLANT
COMPONENT RV AUG LG W/TAPR/GLN (Joint) IMPLANT
COVER BACK TABLE 60X90IN (DRAPES) ×1 IMPLANT
COVER SURGICAL LIGHT HANDLE (MISCELLANEOUS) ×1 IMPLANT
DIAL VERSA SHOULDER 40 STD (Joint) IMPLANT
DRAPE ORTHO SPLIT 77X108 STRL (DRAPES) ×2
DRAPE SHEET LG 3/4 BI-LAMINATE (DRAPES) ×1 IMPLANT
DRAPE SURG 17X11 SM STRL (DRAPES) ×1 IMPLANT
DRAPE SURG ORHT 6 SPLT 77X108 (DRAPES) ×2 IMPLANT
DRAPE TOP 10253 STERILE (DRAPES) ×1 IMPLANT
DRAPE U-SHAPE 47X51 STRL (DRAPES) ×1 IMPLANT
DRSG AQUACEL AG ADV 3.5X 6 (GAUZE/BANDAGES/DRESSINGS) IMPLANT
DRSG AQUACEL AG ADV 3.5X10 (GAUZE/BANDAGES/DRESSINGS) ×1 IMPLANT
DURAPREP 26ML APPLICATOR (WOUND CARE) IMPLANT
ELECT REM PT RETURN 15FT ADLT (MISCELLANEOUS) ×1 IMPLANT
FACESHIELD WRAPAROUND (MASK) ×1 IMPLANT
FACESHIELD WRAPAROUND OR TEAM (MASK) ×1 IMPLANT
GLOVE BIO SURGEON STRL SZ7.5 (GLOVE) ×4 IMPLANT
GLOVE BIOGEL PI IND STRL 8 (GLOVE) ×2 IMPLANT
GOWN STRL REUS W/ TWL XL LVL3 (GOWN DISPOSABLE) ×2 IMPLANT
GOWN STRL REUS W/TWL XL LVL3 (GOWN DISPOSABLE) ×2
GUIDE MODEL REV SHLD RT (ORTHOPEDIC DISPOSABLE SUPPLIES) IMPLANT
KIT BASIN OR (CUSTOM PROCEDURE TRAY) ×1 IMPLANT
KIT TURNOVER KIT A (KITS) IMPLANT
MANIFOLD NEPTUNE II (INSTRUMENTS) ×1 IMPLANT
NDL TAPERED W/ NITINOL LOOP (MISCELLANEOUS) IMPLANT
NEEDLE TAPERED W/ NITINOL LOOP (MISCELLANEOUS) IMPLANT
NS IRRIG 1000ML POUR BTL (IV SOLUTION) ×1 IMPLANT
PACK SHOULDER (CUSTOM PROCEDURE TRAY) ×1 IMPLANT
PIN HUMERAL STMN 3.2MMX9IN (INSTRUMENTS) IMPLANT
PIN THREADED REVERSE (PIN) IMPLANT
PROTECTOR NERVE ULNAR (MISCELLANEOUS) ×1 IMPLANT
REAMER GUIDE BUSHING SURG DISP (MISCELLANEOUS) IMPLANT
REAMER GUIDE MINI 30 ZI (ORTHOPEDIC DISPOSABLE SUPPLIES) IMPLANT
REAMER GUIDE W/SCREW AUG (MISCELLANEOUS) IMPLANT
RESTRAINT HEAD UNIVERSAL NS (MISCELLANEOUS) IMPLANT
SCREW BONE CORT 6.5X35MM (Screw) IMPLANT
SCREW BONE LOCKING 4.75X30X3.5 (Screw) IMPLANT
SCREW BONE LOCKING 4.75X35X3.5 (Screw) IMPLANT
SCREW BONE STRL 6.5MMX30MM (Screw) IMPLANT
SCREW LOCKING 4.75MMX15MM (Screw) IMPLANT
SLING ARM FOAM STRAP MED (SOFTGOODS) IMPLANT
SLING ARM IMMOBILIZER LRG (SOFTGOODS) IMPLANT
SMARTMIX MINI TOWER (MISCELLANEOUS)
SPONGE T-LAP 4X18 ~~LOC~~+RFID (SPONGE) IMPLANT
STEM HUMERAL STRL 12MMX83MM (Stem) IMPLANT
STRIP CLOSURE SKIN 1/2X4 (GAUZE/BANDAGES/DRESSINGS) ×1 IMPLANT
SUCTION FRAZIER HANDLE 10FR (MISCELLANEOUS) ×1
SUCTION TUBE FRAZIER 10FR DISP (MISCELLANEOUS) ×1 IMPLANT
SUT FIBERWIRE #2 38 T-5 BLUE (SUTURE)
SUT MNCRL AB 3-0 PS2 18 (SUTURE) ×1 IMPLANT
SUT VIC AB 0 CT1 36 (SUTURE) ×1 IMPLANT
SUT VIC AB 1 CT1 36 (SUTURE) ×1 IMPLANT
SUT VIC AB 2-0 CT1 27 (SUTURE) ×1
SUT VIC AB 2-0 CT1 TAPERPNT 27 (SUTURE) ×1 IMPLANT
SUTURE FIBERWR #2 38 T-5 BLUE (SUTURE) IMPLANT
SUTURE TAPE 1.3 40 TPR END (SUTURE) ×2 IMPLANT
SUTURETAPE 1.3 40 TPR END (SUTURE) ×2
TOWEL OR 17X26 10 PK STRL BLUE (TOWEL DISPOSABLE) ×1 IMPLANT
TOWER SMARTMIX MINI (MISCELLANEOUS) IMPLANT
TRAY HUM MINI SHOULDER +3 40 (Joint) IMPLANT
TUBE SUCTION HIGH CAP CLEAR NV (SUCTIONS) ×1 IMPLANT

## 2021-12-28 NOTE — H&P (Signed)
ORTHOPAEDIC H and P  REQUESTING PHYSICIAN: Nicholes Stairs, MD  PCP:  Biagio Borg, MD  Chief Complaint: Right shoulder arthritis  HPI: Jason Robles is a 76 y.o. male who complains of worsening right shoulder function as well as increased pain over the last few years.  Here today for definitive treatment with reverse arthroplasty.  No new complaints today.  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   HYPERLIPIDEMIA 08/21/2006   INSOMNIA-SLEEP DISORDER-UNSPEC 09/20/2009   NONSPECIFIC ABN FINDING RAD & OTH EXAM GU ORGAN 05/03/2008   PERIPHERAL VASCULAR DISEASE 08/25/2007   Raynaud's syndrome 08/21/2006   Past Surgical History:  Procedure Laterality Date   APPENDECTOMY     COLONOSCOPY  07/25/2006   POLYPECTOMY     s/p knee surgery Bilateral    x's 4 since 1980   s/p left shoulder  2002   rotator cuff   s/p lumbar disc surgery  2007   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   TOTAL KNEE ARTHROPLASTY Right 03/26/2016   Procedure: RIGHT TOTAL KNEE ARTHROPLASTY;  Surgeon: Vickey Huger, MD;  Location: [facility];  Service: Orthopedics;  Laterality: Right;   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   Procedure: TOTAL KNEE ARTHROPLASTY;  Surgeon: Vickey Huger, MD;  Location: [facility];  Service: Orthopedics;  Laterality: Left;   UPPER GASTROINTESTINAL ENDOSCOPY     Social History   Socioeconomic History   Marital status: Married     Spouse name: Not on file   Number of children: Not on file   Years of education: Not on file   Highest education level: Not on file  Occupational History   Occupation: self employed retail picture framing    Occupation: retired Higher education careers adviser  Tobacco Use   Smoking status: Former    Packs/day: 1.00    Years: 15.00    Total pack years: 15.00    Types: Cigarettes    Quit date: 01/31/1980    Years since quitting: 41.9    Passive exposure: Never   Smokeless tobacco: Never  Vaping Use   Vaping Use: Never used  Substance and Sexual Activity   Alcohol use: Yes    Alcohol/week: 4.0 standard drinks of alcohol    Types: 4 Glasses of wine per week    Comment: ONCE A WEEK   Drug use: No   Sexual activity: Yes  Other Topics Concern   Not on file  Social History Narrative   [place]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   Right handed   Drinks caffeine   2 story home   Lives in [place] Strain: Low Risk  (06/05/2021)   Overall Financial Resource Strain (CARDIA)    Difficulty of Paying Living Expenses: Not hard at all  Food Insecurity: No Food Insecurity (06/05/2021)   Hunger Vital Sign    Worried About Running Out of Food in the Last Year: Never true    Ran Out of Food  in the Last Year: Never true  Transportation Needs: No Transportation Needs (06/05/2021)   PRAPARE - Hydrologist (Medical): No    Lack of Transportation (Non-Medical): No  Physical Activity: Insufficiently Active (06/05/2021)   Exercise Vital Sign    Days of Exercise per Week: 3 days    Minutes of Exercise per Session: 30 min  Stress: No Stress Concern Present (06/05/2021)   [place]    Feeling of Stress : Not at all  Social Connections: Moderately Isolated (06/05/2021)   Social Connection and Isolation Panel [NHANES]    Frequency of Communication with Friends and Family: Twice a week     Frequency of Social Gatherings with Friends and Family: Twice a week    Attends Religious Services: Never    Marine scientist or Organizations: No    Attends Music therapist: Never    Marital Status: Married   Family History  Problem Relation Age of Onset   Diabetes Mother    Alzheimer's disease Mother    Kidney cancer Brother    Diabetes Maternal Uncle    Colon cancer Neg Hx    Colon polyps Neg Hx    Esophageal cancer Neg Hx    Rectal cancer Neg Hx    Stomach cancer Neg Hx    Allergies  Allergen Reactions   Iohexol Itching    Itching post 125cc Omni injection. Treated w/ 50 mg PO benedryl.  Onset Date: 16109604    Prior to Admission medications   Medication Sig Start Date End Date Taking? Authorizing Provider  Ascorbic Acid (VITAMIN C PO) Take 1 tablet by mouth daily.   Yes [provider]  aspirin 81 MG EC tablet Take 1 tablet (81 mg total) by mouth daily. Swallow whole. 09/12/16  Yes Biagio Borg, MD  atorvastatin (LIPITOR) 40 MG tablet Take 1 tablet (40 mg total) by mouth daily. 11/03/21  Yes Biagio Borg, MD  Cyanocobalamin (VITAMIN B-12 PO) Take 1 tablet by mouth daily.   Yes [provider]  meloxicam (MOBIC) 15 MG tablet TAKE 1 TABLET BY MOUTH EVERY DAY 02/01/21  Yes Biagio Borg, MD  methocarbamol (ROBAXIN) 500 MG tablet Take 1 tablet (500 mg total) by mouth 3 (three) times daily. Patient taking differently: Take 500 mg by mouth every 8 (eight) hours as needed for muscle spasms. 06/14/21  Yes Biagio Borg, MD  pantoprazole (PROTONIX) 40 MG tablet TAKE 1 TABLET BY MOUTH EVERY DAY 10/11/21  Yes Biagio Borg, MD  sodium chloride (OCEAN) 0.65 % SOLN nasal spray Place 1 spray into both nostrils as needed for congestion (dry sockets).   Yes [provider]  traMADol (ULTRAM) 50 MG tablet Take 1 tablet (50 mg total) by mouth every 12 (twelve) hours as needed. Patient taking differently: Take 50 mg by mouth 2 (two) times daily.  06/28/21  Yes Biagio Borg, MD  butalbital-acetaminophen-caffeine (FIORICET) [phone] MG tablet Take 1-2 tablets by mouth every 6 (six) hours as needed for headache. Patient not taking: Reported on 12/14/2021 06/28/21 06/28/22  Biagio Borg, MD  ketoconazole (NIZORAL) 2 % cream Apply 1 application. topically daily. Patient not taking: Reported on 12/14/2021 03/27/21   Biagio Borg, MD  nitroGLYCERIN (NITROSTAT) 0.4 MG SL tablet Place 1 tablet (0.4 mg total) under the tongue every 5 (five) minutes as needed for chest pain. 10/22/19   Jettie Booze, MD  triamcinolone (NASACORT) 55 MCG/ACT AERO nasal inhaler Place 2 sprays into the nose daily. Patient not taking: Reported on 12/14/2021 07/23/19   Biagio Borg, MD  Ubrogepant (UBRELVY) 100 MG TABS 1 tab by mouth once per day as needed Patient not taking: Reported on 12/14/2021 06/28/21   Biagio Borg, MD   No results found.  Positive ROS: All other systems have been reviewed and were otherwise negative with the exception of those mentioned in the HPI and as above.  Physical Exam: General: Alert, no acute distress Cardiovascular: No pedal edema Respiratory: No cyanosis, no use of accessory musculature GI: No organomegaly, abdomen is soft and non-tender Skin: No lesions in the area of chief complaint Neurologic: Sensation intact distally Psychiatric: Patient is competent for consent with normal mood and affect Lymphatic: No axillary or cervical lymphadenopathy  MUSCULOSKELETAL: Right upper extremity and shoulder is warm and well-perfused with no open wounds or lesions.  He is neurovascular intact throughout.  Assessment: Right shoulder end-stage osteoarthritis  Plan: Plan to proceed today with reverse arthroplasty for definitive treatment.  No new complaints at this time.  I discussed the risk of bleeding, infection, damage to surrounding nerves and vessels, dislocation, fracture, persistent pain and stiffness, need for revision  surgery as well as the risk of anesthesia.  He has provided informed consent.  Plan for admission postoperatively for observation and therapy and pain control.    Nicholes Stairs, MD [facility] [phone]    12/28/2021 7:13 AM

## 2021-12-28 NOTE — Anesthesia Postprocedure Evaluation (Signed)
Anesthesia Post Note  Patient: Jason Robles  Procedure(s) Performed: REVERSE SHOULDER ARTHROPLASTY (Right: Shoulder)     Patient location during evaluation: PACU Anesthesia Type: Regional and General Level of consciousness: awake and alert Pain management: pain level controlled Vital Signs Assessment: post-procedure vital signs reviewed and stable Respiratory status: spontaneous breathing, nonlabored ventilation, respiratory function stable and patient connected to nasal cannula oxygen Cardiovascular status: blood pressure returned to baseline and stable Postop Assessment: no apparent nausea or vomiting Anesthetic complications: yes   Encounter Notable Events  Notable Event Outcome Phase Comment  Difficult to intubate - expected  Intraprocedure Filed from anesthesia note documentation.    Last Vitals:  Vitals:   12/28/21 1300 12/28/21 1330  BP: 125/74 125/65  Pulse: 78 88  Resp: 16 17  Temp:  36.5 C  SpO2: 94% 97%    Last Pain:  Vitals:   12/28/21 1300  TempSrc:   PainSc: 0-No pain                 Belenda Cruise P Zyon Grout

## 2021-12-28 NOTE — Discharge Instructions (Addendum)
Orthopedic surgery discharge instructions:  -Maintain postoperative bandage until follow-up appointment.  This is waterproof, and you may begin showering on postoperative day #3.  Do not submerge underwater.  Maintain that bandage until your follow-up appointment in 2 weeks.  -No lifting over 2 pounds with operateive arm.  You may use the arm immediately for activities of daily living such as bathing, washing your face and brushing your teeth, eating, and getting dressed.  Otherwise maintain your sling when you are out of the house and sleeping.  -Apply ice liberally to the shoulder throughout the day.  For mild to moderate pain use Tylenol and Advil as needed around-the-clock.  For breakthrough pain use oxycodone as necessary.  -You will return to see Dr. Stann Mainland in the office in 2 weeks for routine postoperative check with x-rays. Call for appointment or any other questions or concerns.

## 2021-12-28 NOTE — Anesthesia Procedure Notes (Signed)
Anesthesia Regional Block: Interscalene brachial plexus block   Pre-Anesthetic Checklist: , timeout performed,  Correct Patient, Correct Site, Correct Laterality,  Correct Procedure, Correct Position, site marked,  Risks and benefits discussed,  Surgical consent,  Pre-op evaluation,  At surgeon's request and post-op pain management  Laterality: Right  Prep: Dura Prep       Needles:  Injection technique: Single-shot  Needle Type: Echogenic Stimulator Needle     Needle Length: 5cm  Needle Gauge: 20     Additional Needles:   Procedures:,,,, ultrasound used (permanent image in chart),,    Narrative:  Start time: 12/28/2021 6:58 AM End time: 12/28/2021 7:03 AM Injection made incrementally with aspirations every 5 mL.  Performed by: Personally  Anesthesiologist: Darral Dash, DO  Additional Notes: Patient identified. Risks/Benefits/Options discussed with patient including but not limited to bleeding, infection, nerve damage, failed block, incomplete pain control. Patient expressed understanding and wished to proceed. All questions were answered. Sterile technique was used throughout the entire procedure. Please see nursing notes for vital signs. Aspirated in 5cc intervals with injection for negative confirmation. Patient was given instructions on fall risk and not to get out of bed. All questions and concerns addressed with instructions to call with any issues or inadequate analgesia.

## 2021-12-28 NOTE — TOC CM/SW Note (Signed)
Transition of Care (TOC) Screening Note  Patient Details  Name: Jason Robles Date of Birth: [birth year]  Transition of Care [facility]) CM/SW Contact:    Sherie Don, LCSW Phone Number: 12/28/2021, 4:04 PM  Transition of Care Department [facility]) has reviewed patient and no TOC needs have been identified at this time. We will continue to monitor patient advancement through interdisciplinary progression rounds. If new patient transition needs arise, please place a TOC consult.

## 2021-12-28 NOTE — Transfer of Care (Signed)
Immediate Anesthesia Transfer of Care Note  Patient: Jason Robles  Procedure(s) Performed: REVERSE SHOULDER ARTHROPLASTY (Right: Shoulder)  Patient Location: PACU  Anesthesia Type:General  Level of Consciousness: oriented, drowsy, and patient cooperative  Airway & Oxygen Therapy: Patient Spontanous Breathing and Patient connected to nasal cannula oxygen  Post-op Assessment: Report given to RN and Post -op Vital signs reviewed and stable  Post vital signs: Reviewed  Last Vitals:  Vitals Value Taken Time  BP 146/85 12/28/21 0926  Temp    Pulse 66 12/28/21 0929  Resp 17 12/28/21 0929  SpO2 100 % 12/28/21 0929  Vitals shown include unvalidated device data.  Last Pain:  Vitals:   12/28/21 0556  TempSrc: Oral  PainSc:          Complications:  Encounter Notable Events  Notable Event Outcome Phase Comment  Difficult to intubate - expected  Intraprocedure Filed from anesthesia note documentation.

## 2021-12-28 NOTE — Op Note (Signed)
12/28/2021  9:29 AM  PATIENT:  Jason Robles    PRE-OPERATIVE DIAGNOSIS:  Right shoulder osteoarthritis  POST-OPERATIVE DIAGNOSIS:  Same  PROCEDURE:  REVERSE SHOULDER ARTHROPLASTY  SURGEON:  Nicholes Stairs, MD  ASSISTANT: Jonelle Sidle, PA-C  Assistant attestation:  PA McClung present for the entire procedure.  ANESTHESIA:   General plus interscalene  ESTIMATED BLOOD LOSS: 75 mL  PREOPERATIVE INDICATIONS:  Jason Robles is a  76 y.o. male with a diagnosis of Right shoulder osteoarthritis who failed conservative measures and elected for surgical management.    The risks benefits and alternatives were discussed with the patient preoperatively including but not limited to the risks of infection, bleeding, nerve injury, cardiopulmonary complications, the need for revision surgery, dislocation, brachial plexus palsy, incomplete relief of pain, among others, and the patient was willing to proceed.  OPERATIVE IMPLANTS:  Biomet comprehensive reverse system with a large augmented baseplate oriented at the 9:30 position.  35 mm central screw and 4 peripheral locking screws.  Size 40 standard glenosphere  On the humeral side size 12 mm mini stem with a +3 taper offset tray and a standard polyethylene liner   OPERATIVE FINDINGS:  End-stage osteoarthritis of the right glenohumeral joint with intact rotator cuff muscle x 4.  He did have significant osteophytes on the humeral side as well as anterior osteophytes on the glenoid side.  Full-thickness cartilage loss on the humerus.  Flattening of the long head of the biceps but intact.  Moderate retroversion also noted.  OPERATIVE PROCEDURE: The patient was brought to the operating room and placed in the supine position. General anesthesia was administered. IV antibiotics were given. A Foley was not placed. Time out was performed. The upper extremity was prepped and draped in usual sterile fashion. The patient was in a beachchair  position. Deltopectoral approach was carried out. The biceps was tenodesed to the pectoralis tendon with #2 Fiberwire. The subscapularis was released off of the bone.   I then performed circumferential releases of the humerus, and then dislocated the head, and then reamed with the reamer to the above named size.  I then applied the jig, and cut the humeral head in 30 of retroversion, and then turned my attention to the glenoid.  Deep retractors were placed, and I resected the labrum, and then placed a guidepin to help of the 3D printing guide.  This was placed into the center position on the glenoid, with slight inferior inclination. I then reamed over the guidepin, and this created a small metaphyseal cancellus blush inferiorly, removing just the cartilage to the subchondral bone superiorly.  We then prepared for the large augment at the 9:30 position.  Second step reamer was used.  The base plate was selected and impacted place, and then I secured it centrally with a nonlocking screw, and I had excellent purchase both inferiorly and superiorly. I placed a short locking screws on anterior and posterior aspects.  I then turned my attention to the glenosphere, and impacted this into place, placing slight inferior offset (set on A).   The glenoid sphere was completely seated, and had engagement of the [facility] taper. I then turned my attention back to the humerus.  I sequentially broached, and then trialed, and was found to restore soft tissue tension, and it had 2 finger tightness. Therefore the above named components were selected. The shoulder felt stable throughout functional motion.    I then impacted the real prosthesis into place, as well as the real  humeral tray, and reduced the shoulder. The shoulder had excellent motion, and was stable, and I irrigated the wounds copiously.   Side-to-side figure-of-eight sutures x 2 were used to repair the subscapularis back to bone.  I then irrigated the  shoulder copiously once more, and we placed 1 g of vancomycin powder into the wound, repaired the deltopectoral interval with #1 Vicryl followed by subcutaneous Vicryl, then monocryl for the skin,  with Steri-Strips and sterile gauze for the skin. The patient was awakened and returned back in stable and satisfactory condition. There no complications and they tolerated the procedure well.  All counts were correct x2.   Disposition:  Jason Robles will be admitted to the orthopedic service postoperatively for pain control and Occupational Therapy.  He will be nonweightbearing to the right upper extremity other than with activities of daily living.  He may remove the sling for ADLs and exercise.  Otherwise he will sleep in the sling for 2 weeks.  I will see him in the office in 2 weeks with x-rays of the right shoulder, 2 views.

## 2021-12-28 NOTE — Anesthesia Procedure Notes (Signed)
Procedure Name: Intubation Date/Time: 12/28/2021 7:36 AM  Performed by: Jenne Campus, CRNAPre-anesthesia Checklist: Patient identified, Emergency Drugs available, Suction available and Patient being monitored Patient Re-evaluated:Patient Re-evaluated prior to induction Oxygen Delivery Method: Circle System Utilized Preoxygenation: Pre-oxygenation with 100% oxygen Induction Type: IV induction Ventilation: Mask ventilation without difficulty and Two handed mask ventilation required Grade View: Grade II Tube type: Oral Tube size: 7.5 mm Number of attempts: 1 Airway Equipment and Method: Stylet and Video-laryngoscopy Placement Confirmation: ETT inserted through vocal cords under direct vision, positive ETCO2 and breath sounds checked- equal and bilateral Secured at: 22 cm Tube secured with: Tape Dental Injury: Teeth and Oropharynx as per pre-operative assessment  Difficulty Due To: Difficulty was anticipated and Difficult Airway- due to anterior larynx Comments: Smooth IV induction. DL x 1 CRNA. Grade 4 view. Head repositioned. DL x 1 MD. Grade 4 view. Atraumatic oral intubation with video-glide. +ETCO2 BBSE.

## 2021-12-28 NOTE — Care Plan (Signed)
Ortho Bundle Case Management Note  Patient Details  Name: Jason Robles MRN: [id number] Date of Birth: 12-18-1945             R Rev TSA on 12-28-21  DCP: Home with wife  DME: Sling and CTU provided by the hosp  PT: HEP        DME Arranged:  N/A DME Agency:       Additional Comments: Please contact me with any questions of if this plan should need to change.  Marianne Sofia, RN,CCM [facility]  [phone] 12/28/2021, 8:50 AM

## 2021-12-28 NOTE — Brief Op Note (Signed)
12/28/2021  9:22 AM  PATIENT:  Jason Robles  76 y.o. male  PRE-OPERATIVE DIAGNOSIS:  Right shoulder osteoarthritis  POST-OPERATIVE DIAGNOSIS:  Right shoulder osteoarthritis  PROCEDURE:  Procedure(s) with comments: REVERSE SHOULDER ARTHROPLASTY (Right) - 150  SURGEON:  Surgeon(s) and Role:    * Stann Mainland, Elly Modena, MD - Primary  PHYSICIAN ASSISTANT: Jonelle Sidle, PA-C  ANESTHESIA:   regional and general  EBL:  75 mL   BLOOD ADMINISTERED:none  DRAINS: none   LOCAL MEDICATIONS USED:  NONE  SPECIMEN:  No Specimen  DISPOSITION OF SPECIMEN:  N/A  COUNTS:  YES  TOURNIQUET:  * No tourniquets in log *  DICTATION: .Note written in EPIC  PLAN OF CARE: Admit for overnight observation  PATIENT DISPOSITION:  PACU - hemodynamically stable.   Delay start of Pharmacological VTE agent (>24hrs) due to surgical blood loss or risk of bleeding: not applicable

## 2021-12-29 DIAGNOSIS — Z7982 Long term (current) use of aspirin: Secondary | ICD-10-CM | POA: Diagnosis not present

## 2021-12-29 DIAGNOSIS — M19011 Primary osteoarthritis, right shoulder: Secondary | ICD-10-CM | POA: Diagnosis not present

## 2021-12-29 DIAGNOSIS — Z79899 Other long term (current) drug therapy: Secondary | ICD-10-CM | POA: Diagnosis not present

## 2021-12-29 DIAGNOSIS — Z87891 Personal history of nicotine dependence: Secondary | ICD-10-CM | POA: Diagnosis not present

## 2021-12-29 DIAGNOSIS — Z96653 Presence of artificial knee joint, bilateral: Secondary | ICD-10-CM | POA: Diagnosis not present

## 2021-12-29 LAB — BASIC METABOLIC PANEL
Anion gap: 5 (ref 5–15)
BUN: 19 mg/dL (ref 8–23)
CO2: 28 mmol/L (ref 22–32)
Calcium: 9.2 mg/dL (ref 8.9–10.3)
Chloride: 102 mmol/L (ref 98–111)
Creatinine, Ser: 0.96 mg/dL (ref 0.61–1.24)
GFR, Estimated: >60 mL/min (ref >60)
Glucose, Bld: 163 mg/dL — ABNORMAL HIGH (ref 70–99)
Potassium: 4.1 mmol/L (ref 3.5–5.1)
Sodium: 135 mmol/L (ref 135–145)

## 2021-12-29 LAB — HEMOGLOBIN AND HEMATOCRIT, BLOOD
HCT: 35.5 % — ABNORMAL LOW (ref 39.0–52.0)
Hemoglobin: 11.7 g/dL — ABNORMAL LOW (ref 13.0–17.0)

## 2021-12-29 MED ORDER — ONDANSETRON HCL 4 MG PO TABS: 4.0000 mg | ORAL_TABLET | Freq: Three times a day (TID) | ORAL | 0 refills | Status: DC | PRN

## 2021-12-29 MED ORDER — HYDROCODONE-ACETAMINOPHEN 5-325 MG PO TABS: 1.0000 | ORAL_TABLET | ORAL | 0 refills | Status: AC | PRN

## 2021-12-29 NOTE — Discharge Summary (Signed)
Patient ID: Jason Robles MRN: [id number] DOB/AGE: 1945/09/09 76 y.o.  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   Diverticulosis of colon (without mention of hemorrhage) 04/18/2007   EXTERNAL HEMORRHOIDS 04/18/2007   GERD (gastroesophageal reflux disease)    Headache(784.0) 09/20/2009   hx migraines   HEARING LOSS, 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   HYPERLIPIDEMIA 08/21/2006   INSOMNIA-SLEEP DISORDER-UNSPEC 09/20/2009   NONSPECIFIC ABN FINDING RAD & OTH EXAM GU ORGAN 05/03/2008   PERIPHERAL VASCULAR DISEASE 08/25/2007   Raynaud's syndrome 08/21/2006   Discharge Diagnoses:   Principal Problem:   S/P reverse total shoulder arthroplasty, right  Estimated body mass index is 23.43 kg/m as calculated from the following:   Height as of this encounter: '5\' 11"'$  (1.803 m).   Weight as of this encounter: 76.2 kg.  Procedure:  Procedure(s) (LRB): REVERSE SHOULDER ARTHROPLASTY (Right)   Consults: None  HPI: Jason Robles is a 76 year old male presented to the hospital on 12/28/2021 for a right reverse total shoulder arthroplasty.  Patient was seen in our office initially and treated conservatively for his right shoulder osteoarthritis.  He continued to have increased pain and dysfunction despite conservative treatment.  He was indicated for shoulder arthroplasty.  Patient was admitted for over night  pain monitoring and OT evaluation.  Laboratory Data: Admission on 12/28/2021, Discharged on 12/29/2021   Component Date Value Ref Range Status   Hemoglobin 12/29/2021 11.7 (L)  13.0 - 17.0 g/dL Final   HCT 12/29/2021 35.5 (L)  39.0 - 52.0 % Final   Performed at [name], [place] [address], [place], [place] [address]   Sodium 12/29/2021 135  135 - 145 mmol/L Final   Potassium 12/29/2021 4.1  3.5 - 5.1 mmol/L Final   Chloride 12/29/2021 102  98 - 111 mmol/L Final   CO2 12/29/2021 28  22 - 32 mmol/L Final   Glucose, Bld 12/29/2021 163 (H)  70 - 99 mg/dL Final   Glucose reference range applies only to samples taken after fasting for at least 8 hours.   BUN 12/29/2021 19  8 - 23 mg/dL Final   Creatinine, Ser 12/29/2021 0.96  0.61 - 1.24 mg/dL Final   Calcium 12/29/2021 9.2  8.9 - 10.3 mg/dL Final   GFR, Estimated 12/29/2021 >60  >60 mL/min Final   Comment: (NOTE) Calculated using the CKD-EPI Creatinine Equation (2021)    Anion gap 12/29/2021 5  5 - 15 Final   Performed at [facility], [place] [address], [place], [place] [address]  Hospital Outpatient Visit on 12/19/2021  Component Date Value Ref Range Status   MRSA, PCR 12/19/2021 NEGATIVE  NEGATIVE Final   Staphylococcus aureus 12/19/2021 NEGATIVE  NEGATIVE Final   Comment: (NOTE) The Xpert SA Assay (FDA approved for NASAL specimens in patients 52 years of age and older), is one component of a comprehensive surveillance program. It is not intended to diagnose infection nor to guide or monitor treatment. Performed at [facility], [place] [address], [place], [place] [address]    WBC 12/19/2021 6.1  4.0 - 10.5 K/uL Final   RBC 12/19/2021 4.90  4.22 - 5.81 MIL/uL Final   Hemoglobin 12/19/2021 14.3  13.0 - 17.0 g/dL Final   HCT 12/19/2021 43.0  39.0 - 52.0 % Final   MCV 12/19/2021 87.8  80.0 - 100.0 fL Final   MCH 12/19/2021 29.2  26.0 - 34.0 pg Final   MCHC 12/19/2021 33.3  30.0 - 36.0 g/dL Final   RDW 12/19/2021 13.2  11.5 - 15.5 % Final   Platelets 12/19/2021 196  150 - 400 K/uL  Final   nRBC 12/19/2021 0.0  0.0 - 0.2 % Final   Performed at [facility], [place] [address], [place], [place] [address]     X-Rays:DG Shoulder Right Port  Result Date: 12/28/2021 CLINICAL DATA:  Reversed total shoulder arthroplasty. EXAM: RIGHT SHOULDER - 1 VIEW COMPARISON:  CT scan 09/26/2021 FINDINGS: Single portable AP film demonstrates well seated components of a total shoulder arthroplasty. No complicating features are identified. IMPRESSION: Well seated components of a total shoulder arthroplasty. Electronically Signed   By: Marijo Sanes M.D.   On: 12/28/2021 10:19    EKG: Orders placed or performed in visit on 10/22/19   EKG 12-Lead     Hospital Course: ROBERTS BON is a 76 y.o. who was admitted to Hospital. They were brought to the operating room on 12/28/2021 and underwent Procedure(s): [place].  Patient tolerated the procedure well and was later transferred to the recovery room and then to the orthopaedic floor for postoperative care.  They were given PO and IV analgesics for pain control following their surgery.  They were given 24 hours of postoperative antibiotics of  Anti-infectives (From admission, onward)    Start     Dose/Rate Route Frequency Ordered Stop   12/28/21 1415  ceFAZolin (ANCEF) IVPB 1 g/50 mL premix        1 g 100 mL/hr over 30 Minutes Intravenous Every 6 hours 12/28/21 1327 12/29/21 0257   12/28/21 0759  vancomycin (VANCOCIN) powder  Status:  Discontinued          As needed 12/28/21 0759 12/28/21 1319   12/28/21 0600  ceFAZolin (ANCEF) IVPB 2g/100 mL premix        2 g 200 mL/hr over 30 Minutes Intravenous On call to O.R. 12/28/21 9798 12/28/21 0742      and started on DVT prophylaxis in the form of Aspirin.    OT was ordered for total joint protocol.  Discharge planning consulted to help with postop disposition and equipment needs.  Patient had an uneventful night on the evening of surgery.  They started to get  up OOB with therapy on day one. The patient had progressed with therapy and meeting their goals.  Patient was evaluated by OT and doing well.  Patient was ready to go home.   Diet: Regular diet Activity:NWB RUE  Follow-up:in 2 weeks Disposition - Home Discharged Condition: good   Discharge Instructions     Call MD / Call 911   Complete by: As directed    If you experience chest pain or shortness of breath, CALL 911 and be transported to the hospital emergency room.  If you develope a fever above 101 F, pus (white drainage) or increased drainage or redness at the wound, or calf pain, call your surgeon's office.   Constipation Prevention   Complete by: As directed    Drink plenty of fluids.  Prune juice may be helpful.  You may use  a stool softener, such as Colace (over the counter) 100 mg twice a day.  Use MiraLax (over the counter) for constipation as needed.   Diet - low sodium heart healthy   Complete by: As directed    Increase activity slowly as tolerated   Complete by: As directed    Post-operative opioid taper instructions:   Complete by: As directed    POST-OPERATIVE OPIOID TAPER INSTRUCTIONS: It is important to wean off of your opioid medication as soon as possible. If you do not need pain medication after your surgery it is ok to stop day one. Opioids include: Codeine, Hydrocodone(Norco, Vicodin), Oxycodone(Percocet, oxycontin) and hydromorphone amongst others.  Long term and even short term use of opiods can cause: Increased pain response Dependence Constipation Depression Respiratory depression And more.  Withdrawal symptoms can include Flu like symptoms Nausea, vomiting And more Techniques to manage these symptoms Hydrate well Eat regular healthy meals Stay active Use relaxation techniques(deep breathing, meditating, yoga) Do Not substitute Alcohol to help with tapering If you have been on opioids for less than two weeks and do not have pain than it is ok to  stop all together.  Plan to wean off of opioids This plan should start within one week post op of your joint replacement. Maintain the same interval or time between taking each dose and first decrease the dose.  Cut the total daily intake of opioids by one tablet each day Next start to increase the time between doses. The last dose that should be eliminated is the evening dose.         Allergies as of 12/29/2021       Reactions   Iohexol Itching   Itching post 125cc Omni injection. Treated w/ 50 mg PO benedryl.  Onset Date: 16109604        Medication List     TAKE these medications    aspirin EC 81 MG tablet Take 1 tablet (81 mg total) by mouth daily. Swallow whole.   atorvastatin 40 MG tablet Commonly known as: LIPITOR Take 1 tablet (40 mg total) by mouth daily.   butalbital-acetaminophen-caffeine 50-325-40 MG tablet Commonly known as: FIORICET Take 1-2 tablets by mouth every 6 (six) hours as needed for headache.   HYDROcodone-acetaminophen 5-325 MG tablet Commonly known as: NORCO/VICODIN Take 1 tablet by mouth every 4 (four) hours as needed for moderate pain.   ketoconazole 2 % cream Commonly known as: NIZORAL Apply 1 application. topically daily.   meloxicam 15 MG tablet Commonly known as: MOBIC TAKE 1 TABLET BY MOUTH EVERY DAY   methocarbamol 500 MG tablet Commonly known as: ROBAXIN Take 1 tablet (500 mg total) by mouth 3 (three) times daily. What changed:  when to take this reasons to take this   nitroGLYCERIN 0.4 MG SL tablet Commonly known as: NITROSTAT Place 1 tablet (0.4 mg total) under the tongue every 5 (five) minutes as needed for chest pain.   ondansetron 4 MG tablet Commonly known as: Zofran Take 1 tablet (4 mg total) by mouth every 8 (eight) hours as needed for vomiting or nausea.   pantoprazole 40 MG tablet Commonly known as: PROTONIX TAKE 1 TABLET BY MOUTH EVERY DAY   sodium chloride 0.65 % Soln nasal spray Commonly known as:  OCEAN Place 1 spray into both nostrils as needed for congestion (dry sockets).   traMADol 50 MG tablet Commonly known as: ULTRAM Take 1 tablet (50 mg total) by mouth every 12 (twelve) hours as needed. What changed: when  to take this   triamcinolone 55 MCG/ACT Aero nasal inhaler Commonly known as: NASACORT Place 2 sprays into the nose daily.   Ubrelvy 100 MG Tabs Generic drug: Ubrogepant 1 tab by mouth once per day as needed   VITAMIN B-12 PO Take 1 tablet by mouth daily.   VITAMIN C PO Take 1 tablet by mouth daily.        Follow-up Information     Nicholes Stairs, MD. Schedule an appointment as soon as possible for a visit in 2 week(s).   Specialty: Orthopedic Surgery Why: For wound re-check Contact information: [address] [place] [address] [phone]                 Signed: Jonelle Sidle, PA-C Orthopaedic Surgery 12/29/2021, 1:05 PM

## 2021-12-29 NOTE — Evaluation (Signed)
Occupational Therapy Evaluation Patient Details Name: Jason Robles MRN: [id number] DOB: 13-May-1945 Today's Date: 12/29/2021   History of Present Illness Patietn s/p right reverse shoulder athroplasty   Clinical Impression   Mr. Jason Robles is a 76 year old man who presents s/p shoulder replacement without functional use of right dominant upper extremity secondary to effects of surgery and interscalene block and shoulder precautions. Therapist provided education and instruction to patient and spouse in regards to exercises, precautions, positioning, donning upper extremity clothing while maintaining shoulder precautions, ice and edema management and donning/doffing sling. Patient and spouse verbalized understanding and demonstrated as needed. Patient needed assistance to donn shirt, underwear, pants, and socks. Patient to follow up with MD for further therapy needs.        Recommendations for follow up therapy are one component of a multi-disciplinary discharge planning process, led by the attending physician.  Recommendations may be updated based on patient status, additional functional criteria and insurance authorization.   Follow Up Recommendations  Follow physician's recommendations for discharge plan and follow up therapies     Assistance Recommended at Discharge Intermittent Supervision/Assistance  Patient can return home with the following A little help with bathing/dressing/bathroom;Assistance with cooking/housework    Functional Status Assessment  Patient has had a recent decline in their functional status and demonstrates the ability to make significant improvements in function in a reasonable and predictable amount of time.  Equipment Recommendations  None recommended by OT    Recommendations for Other Services       Precautions / Restrictions Precautions Precaution Comments: FF A/P/AA 0-90, ABD A/P/AA 0-60, ER A/P/AA 0-30 Required Braces or Orthoses:  Sling Restrictions Weight Bearing Restrictions: Yes RUE Weight Bearing: Non weight bearing Other Position/Activity Restrictions: NWB except for ADLs. Wear sling at night and when not performing ADLs and exercises      Mobility Bed Mobility Overal bed mobility: Independent                  Transfers Overall transfer level: Independent                        Balance Overall balance assessment: No apparent balance deficits (not formally assessed)                                         ADL either performed or assessed with clinical judgement   ADL                                               Vision Patient Visual Report: No change from baseline       Perception     Praxis      Pertinent Vitals/Pain Pain Assessment Pain Assessment: No/denies pain     Hand Dominance Right   Extremity/Trunk Assessment Upper Extremity Assessment Upper Extremity Assessment: RUE deficits/detail RUE Deficits / Details: impaired motor control and senation secondary to block   Lower Extremity Assessment Lower Extremity Assessment: Overall WFL for tasks assessed   Cervical / Trunk Assessment Cervical / Trunk Assessment: Normal   Communication Communication Communication: No difficulties   Cognition Arousal/Alertness: Awake/alert Behavior During Therapy: WFL for tasks assessed/performed Overall Cognitive Status: Within Functional Limits for tasks assessed  General Comments       Exercises     Shoulder Instructions Shoulder Instructions Donning/doffing shirt without moving shoulder: Caregiver independent with task Method for sponge bathing under operated UE: Caregiver independent with task Donning/doffing sling/immobilizer: Caregiver independent with task Correct positioning of sling/immobilizer: Patient able to independently direct caregiver ROM for elbow, wrist and  digits of operated UE: Patient able to independently direct caregiver Sling wearing schedule (on at all times/off for ADL's): Patient able to independently direct caregiver Proper positioning of operated UE when showering: Patient able to independently direct caregiver Dressing change: Patient able to independently direct caregiver Positioning of UE while sleeping: Patient able to independently direct caregiver    Home Living Family/patient expects to be discharged to:: Private residence Living Arrangements: Spouse/significant other Available Help at Discharge: Available 24 hours/day;Family Type of Home: House Home Access: Stairs to enter [employer] of Steps: 3 Entrance Stairs-Rails: None Home Layout: Two level Alternate Level Stairs-Number of Steps: 15   Bathroom Shower/Tub: Occupational psychologist: Standard Bathroom Accessibility: Yes   Home Equipment: Conservation officer, nature (2 wheels)          Prior Functioning/Environment Prior Level of Function : Independent/Modified Independent                        OT Problem List: Decreased strength;Decreased range of motion;Impaired UE functional use      OT Treatment/Interventions:      OT Goals(Current goals can be found in the care plan section) Acute Rehab OT Goals OT Goal Formulation: All assessment and education complete, DC therapy  OT Frequency:      Co-evaluation              AM-PAC OT "6 Clicks" Daily Activity     Outcome Measure Help from another person eating meals?: A Little Help from another person taking care of personal grooming?: A Little Help from another person toileting, which includes using toliet, bedpan, or urinal?: None Help from another person bathing (including washing, rinsing, drying)?: A Little Help from another person to put on and taking off regular upper body clothing?: A Lot Help from another person to put on and taking off regular lower body clothing?: A Little 6  Click Score: 18   End of Session Nurse Communication:  (OT education complete)  Activity Tolerance: Patient tolerated treatment well Patient left: in chair;with call bell/phone within reach;with family/visitor present  OT Visit Diagnosis: Pain                Time: 1610-9604 OT Time Calculation (min): 21 min Charges:  OT General Charges $OT Visit: 1 Visit OT Evaluation $OT Eval Low Complexity: 1 Low  Gustavo Lah, OTR/L Acute Care Rehab Services  Office [phone]   Jason Robles 12/29/2021, 9:25 AM

## 2022-01-02 ENCOUNTER — Encounter (HOSPITAL_COMMUNITY): Payer: Self-pay | Admitting: Orthopedic Surgery

## 2022-01-11 DIAGNOSIS — Z4789 Encounter for other orthopedic aftercare: Secondary | ICD-10-CM | POA: Diagnosis not present

## 2022-01-12 ENCOUNTER — Telehealth: Payer: Self-pay | Admitting: *Deleted

## 2022-01-12 NOTE — Telephone Encounter (Signed)
Rec;d a determination " Your request has been approved Authorization Expiration Date: 01/01/2023. Faxed back to [facility].Marland KitchenJohny Robles

## 2022-01-12 NOTE — Telephone Encounter (Signed)
Rec'd fax pt needing PA on Ubrelvy 100 mg. Submitted w/ (Key: [id number]) PA request has been sent to [facility] Part D.Marland KitchenChryl Heck

## 2022-01-15 DIAGNOSIS — M13831 Other specified arthritis, right wrist: Secondary | ICD-10-CM | POA: Diagnosis not present

## 2022-01-15 DIAGNOSIS — M67431 Ganglion, right wrist: Secondary | ICD-10-CM | POA: Diagnosis not present

## 2022-01-25 DIAGNOSIS — R52 Pain, unspecified: Secondary | ICD-10-CM | POA: Diagnosis not present

## 2022-01-25 DIAGNOSIS — M67431 Ganglion, right wrist: Secondary | ICD-10-CM | POA: Diagnosis not present

## 2022-01-25 DIAGNOSIS — M720 Palmar fascial fibromatosis [Dupuytren]: Secondary | ICD-10-CM | POA: Diagnosis not present

## 2022-01-25 DIAGNOSIS — M13831 Other specified arthritis, right wrist: Secondary | ICD-10-CM | POA: Diagnosis not present

## 2022-01-26 ENCOUNTER — Other Ambulatory Visit: Payer: Self-pay | Admitting: Internal Medicine

## 2022-01-26 DIAGNOSIS — R52 Pain, unspecified: Secondary | ICD-10-CM

## 2022-01-26 HISTORY — DX: Pain, unspecified: R52

## 2022-02-13 DIAGNOSIS — Z4789 Encounter for other orthopedic aftercare: Secondary | ICD-10-CM | POA: Diagnosis not present

## 2022-02-23 ENCOUNTER — Other Ambulatory Visit: Payer: Self-pay | Admitting: Orthopedic Surgery

## 2022-02-23 DIAGNOSIS — M720 Palmar fascial fibromatosis [Dupuytren]: Secondary | ICD-10-CM | POA: Diagnosis not present

## 2022-02-23 DIAGNOSIS — M67431 Ganglion, right wrist: Secondary | ICD-10-CM | POA: Diagnosis not present

## 2022-03-08 ENCOUNTER — Encounter: Payer: Medicare HMO | Admitting: Psychology

## 2022-03-08 DIAGNOSIS — M25641 Stiffness of right hand, not elsewhere classified: Secondary | ICD-10-CM | POA: Diagnosis not present

## 2022-03-13 DIAGNOSIS — M25641 Stiffness of right hand, not elsewhere classified: Secondary | ICD-10-CM | POA: Diagnosis not present

## 2022-03-14 DIAGNOSIS — M720 Palmar fascial fibromatosis [Dupuytren]: Secondary | ICD-10-CM | POA: Diagnosis not present

## 2022-03-19 ENCOUNTER — Encounter: Payer: Medicare HMO | Admitting: Psychology

## 2022-03-27 DIAGNOSIS — M25641 Stiffness of right hand, not elsewhere classified: Secondary | ICD-10-CM | POA: Diagnosis not present

## 2022-04-05 DIAGNOSIS — Z4789 Encounter for other orthopedic aftercare: Secondary | ICD-10-CM | POA: Diagnosis not present

## 2022-04-12 DIAGNOSIS — Z96611 Presence of right artificial shoulder joint: Secondary | ICD-10-CM | POA: Diagnosis not present

## 2022-04-12 DIAGNOSIS — Z471 Aftercare following joint replacement surgery: Secondary | ICD-10-CM | POA: Diagnosis not present

## 2022-04-26 DIAGNOSIS — R200 Anesthesia of skin: Secondary | ICD-10-CM

## 2022-04-26 HISTORY — DX: Anesthesia of skin: R20.0

## 2022-05-09 ENCOUNTER — Ambulatory Visit: Payer: Medicare HMO | Admitting: Internal Medicine

## 2022-05-10 ENCOUNTER — Encounter: Payer: Self-pay | Admitting: Psychology

## 2022-05-10 ENCOUNTER — Telehealth: Payer: Self-pay | Admitting: Internal Medicine

## 2022-05-10 NOTE — Telephone Encounter (Signed)
Contacted Deno H Batchelder to schedule their annual wellness visit. Appointment made for 05/22/2022.  [facility] Care Guide [facility] AWV TEAM Direct Dial: [phone]

## 2022-05-23 ENCOUNTER — Encounter: Payer: Medicare HMO | Admitting: Psychology

## 2022-05-25 ENCOUNTER — Encounter: Payer: Self-pay | Admitting: Internal Medicine

## 2022-05-25 ENCOUNTER — Ambulatory Visit (INDEPENDENT_AMBULATORY_CARE_PROVIDER_SITE_OTHER): Payer: Medicare HMO | Admitting: Internal Medicine

## 2022-05-25 VITALS — BP 126/80 | HR 80 | Temp 98.4°F | Ht 71.0 in | Wt 179.0 lb

## 2022-05-25 DIAGNOSIS — L237 Allergic contact dermatitis due to plants, except food: Secondary | ICD-10-CM | POA: Diagnosis not present

## 2022-05-25 MED ORDER — PREDNISONE 10 MG PO TABS: ORAL_TABLET | ORAL | 0 refills | Status: DC

## 2022-05-25 MED ORDER — METHYLPREDNISOLONE ACETATE 80 MG/ML IJ SUSP
80.0000 mg | Freq: Once | INTRAMUSCULAR | Status: AC
Start: 2022-05-25 — End: 2022-05-25
  Administered 2022-05-25: 80 mg via INTRAMUSCULAR

## 2022-05-25 NOTE — Progress Notes (Signed)
Subjective:    Patient ID: Jason Robles, male    DOB: September 01, 1945, 77 y.o.   MRN: [id number]      HPI Jason Robles is here for  Chief Complaint  Patient presents with   Poison Ivy    Left arm and left leg    He has poison ivy or poison oak.  He has had this in the past.  He did mow the lawn last week but did not go in the woods and does not recall getting in touch with anything like poison ivy or poison oak.  He wonders if he got it from his dogs.  He first noticed the rash on his left distal arm and has spread a little bit further down the forearm.  He also has it on his left leg.  It is itchy.  He is concerned that will continue to spread.    Medications and allergies reviewed with patient and updated if appropriate.  Current Outpatient Medications on File Prior to Visit  Medication Sig Dispense Refill   Ascorbic Acid (VITAMIN C PO) Take 1 tablet by mouth daily.     aspirin 81 MG EC tablet Take 1 tablet (81 mg total) by mouth daily. Swallow whole. 30 tablet 12   atorvastatin (LIPITOR) 40 MG tablet Take 1 tablet (40 mg total) by mouth daily. 90 tablet 3   Cyanocobalamin (VITAMIN B-12 PO) Take 1 tablet by mouth daily.     HYDROcodone-acetaminophen (NORCO/VICODIN) 5-325 MG tablet Take 1 tablet by mouth every 4 (four) hours as needed for moderate pain. 20 tablet 0   ketoconazole (NIZORAL) 2 % cream Apply 1 application. topically daily. 30 g 2   meloxicam (MOBIC) 15 MG tablet TAKE 1 TABLET BY MOUTH EVERY DAY 90 tablet 2   methocarbamol (ROBAXIN) 500 MG tablet Take 1 tablet (500 mg total) by mouth 3 (three) times daily. (Patient taking differently: Take 500 mg by mouth every 8 (eight) hours as needed for muscle spasms.) 90 tablet 2   nitroGLYCERIN (NITROSTAT) 0.4 MG SL tablet Place 1 tablet (0.4 mg total) under the tongue every 5 (five) minutes as needed for chest pain. 25 tablet 3   ondansetron (ZOFRAN) 4 MG tablet Take 1 tablet (4 mg total) by mouth every 8 (eight) hours as needed  for vomiting or nausea. 20 tablet 0   pantoprazole (PROTONIX) 40 MG tablet TAKE 1 TABLET BY MOUTH EVERY DAY 90 tablet 3   sodium chloride (OCEAN) 0.65 % SOLN nasal spray Place 1 spray into both nostrils as needed for congestion (dry sockets).     traMADol (ULTRAM) 50 MG tablet Take 1 tablet (50 mg total) by mouth every 12 (twelve) hours as needed. (Patient taking differently: Take 50 mg by mouth 2 (two) times daily.) 60 tablet 3   triamcinolone (NASACORT) 55 MCG/ACT AERO nasal inhaler Place 2 sprays into the nose daily. 1 Inhaler 12   Ubrogepant (UBRELVY) 100 MG TABS 1 tab by mouth once per day as needed 30 tablet 5   butalbital-acetaminophen-caffeine (FIORICET) 50-325-40 MG tablet Take 1-2 tablets by mouth every 6 (six) hours as needed for headache. (Patient not taking: Reported on 12/14/2021) 20 tablet 0   No current facility-administered medications on file prior to visit.    Review of Systems  Constitutional:  Negative for fever.  Respiratory:  Negative for cough, shortness of breath and wheezing.   Skin:  Positive for rash.       Objective:   Vitals:   05/25/22  1601  BP: 126/80  Pulse: 80  Temp: 98.4 F (36.9 C)  SpO2: 97%   BP Readings from Last 3 Encounters:  05/25/22 126/80  12/29/21 105/71  12/19/21 (!) 130/92   Wt Readings from Last 3 Encounters:  05/25/22 179 lb (81.2 kg)  12/28/21 168 lb (76.2 kg)  12/19/21 168 lb (76.2 kg)   Body mass index is 24.97 kg/m.    Physical Exam Constitutional:      General: He is not in acute distress.    Appearance: Normal appearance. He is not ill-appearing.  HENT:     Head: Normocephalic and atraumatic.  Skin:    General: Skin is warm and dry.     Findings: Rash (Macular, blistery clusters left distal arm with a couple of areas that look like scratch mark or linear lines.  Small area and left leg.  Mild generalized erythema around the clusters.  No large open wounds or discharge.) present.  Neurological:     Mental  Status: He is alert.            Assessment & Plan:    Poison ivy: Acute Likely got it from the dog's It has spread and it is itchy Given that the rash is on multiple body parts I do not think steroid cream would be sufficient Neck Depo-Medrol 80 mg IM x 1 Prednisone taper starting tomorrow for few days Discussed what he can apply to the rash topically Call if no improvement

## 2022-05-25 NOTE — Patient Instructions (Addendum)
      Steroid injection given today    Medications changes include :   prednisone taper starting tomorrow      Return if symptoms worsen or fail to improve.

## 2022-05-29 ENCOUNTER — Ambulatory Visit: Payer: Medicare HMO | Admitting: Internal Medicine

## 2022-06-01 ENCOUNTER — Encounter: Payer: Medicare HMO | Admitting: Psychology

## 2022-06-07 ENCOUNTER — Ambulatory Visit (INDEPENDENT_AMBULATORY_CARE_PROVIDER_SITE_OTHER): Payer: Medicare HMO

## 2022-06-07 VITALS — Ht 71.0 in | Wt 176.0 lb

## 2022-06-07 DIAGNOSIS — Z0000 Encounter for general adult medical examination without abnormal findings: Secondary | ICD-10-CM | POA: Diagnosis not present

## 2022-06-07 NOTE — Patient Instructions (Addendum)
Jason Robles , Thank you for taking time to come for your Medicare Wellness Visit. I appreciate your ongoing commitment to your health goals. Please review the following plan we discussed and let me know if I can assist you in the future.   These are the goals we discussed:  Goals      My goal for 2024 is to maintain my health.        This is a list of the screening recommended for you and due dates:  Health Maintenance  Topic Date Due   COVID-19 Vaccine (7 - 2023-24 season) 09/18/2021   Flu Shot  08/02/2022   Medicare Annual Wellness Visit  06/07/2023   Colon Cancer Screening  03/14/2026   DTaP/Tdap/Td vaccine (3 - Td or Tdap) 07/18/2031   Pneumonia Vaccine  Completed   Hepatitis C Screening  Completed   Zoster (Shingles) Vaccine  Completed   HPV Vaccine  Aged Out    Advanced directives: Yes  Conditions/risks identified: Yes  Next appointment: Follow up in one year for your annual wellness visit.   Preventive Care 48 Years and Older, Male  Preventive care refers to lifestyle choices and visits with your health care provider that can promote health and wellness. What does preventive care include? A yearly physical exam. This is also called an annual well check. Dental exams once or twice a year. Routine eye exams. Ask your health care provider how often you should have your eyes checked. Personal lifestyle choices, including: Daily care of your teeth and gums. Regular physical activity. Eating a healthy diet. Avoiding tobacco and drug use. Limiting alcohol use. Practicing safe sex. Taking low doses of aspirin every day. Taking vitamin and mineral supplements as recommended by your health care provider. What happens during an annual well check? The services and screenings done by your health care provider during your annual well check will depend on your age, overall health, lifestyle risk factors, and family history of disease. Counseling  Your health care provider  may ask you questions about your: Alcohol use. Tobacco use. Drug use. Emotional well-being. Home and relationship well-being. Sexual activity. Eating habits. History of falls. Memory and ability to understand (cognition). Work and work Astronomer. Screening  You may have the following tests or measurements: Height, weight, and BMI. Blood pressure. Lipid and cholesterol levels. These may be checked every 5 years, or more frequently if you are over 103 years old. Skin check. Lung cancer screening. You may have this screening every year starting at age 48 if you have a 30-pack-year history of smoking and currently smoke or have quit within the past 15 years. Fecal occult blood test (FOBT) of the stool. You may have this test every year starting at age 42. Flexible sigmoidoscopy or colonoscopy. You may have a sigmoidoscopy every 5 years or a colonoscopy every 10 years starting at age 74. Prostate cancer screening. Recommendations will vary depending on your family history and other risks. Hepatitis C blood test. Hepatitis B blood test. Sexually transmitted disease (STD) testing. Diabetes screening. This is done by checking your blood sugar (glucose) after you have not eaten for a while (fasting). You may have this done every 1-3 years. Abdominal aortic aneurysm (AAA) screening. You may need this if you are a current or former smoker. Osteoporosis. You may be screened starting at age 22 if you are at high risk. Talk with your health care provider about your test results, treatment options, and if necessary, the need for more tests.  Vaccines  Your health care provider may recommend certain vaccines, such as: Influenza vaccine. This is recommended every year. Tetanus, diphtheria, and acellular pertussis (Tdap, Td) vaccine. You may need a Td booster every 10 years. Zoster vaccine. You may need this after age 71. Pneumococcal 13-valent conjugate (PCV13) vaccine. One dose is recommended after  age 41. Pneumococcal polysaccharide (PPSV23) vaccine. One dose is recommended after age 71. Talk to your health care provider about which screenings and vaccines you need and how often you need them. This information is not intended to replace advice given to you by your health care provider. Make sure you discuss any questions you have with your health care provider. Document Released: 01/14/2015 Document Revised: 09/07/2015 Document Reviewed: 10/19/2014 Elsevier Interactive Patient Education  2017 [employer].  Fall Prevention in the Home Falls can cause injuries. They can happen to people of all ages. There are many things you can do to make your home safe and to help prevent falls. What can I do on the outside of my home? Regularly fix the edges of walkways and driveways and fix any cracks. Remove anything that might make you trip as you walk through a door, such as a raised step or threshold. Trim any bushes or trees on the path to your home. Use bright outdoor lighting. Clear any walking paths of anything that might make someone trip, such as rocks or tools. Regularly check to see if handrails are loose or broken. Make sure that both sides of any steps have handrails. Any raised decks and porches should have guardrails on the edges. Have any leaves, snow, or ice cleared regularly. Use sand or salt on walking paths during winter. Clean up any spills in your garage right away. This includes oil or grease spills. What can I do in the bathroom? Use night lights. Install grab bars by the toilet and in the tub and shower. Do not use towel bars as grab bars. Use non-skid mats or decals in the tub or shower. If you need to sit down in the shower, use a plastic, non-slip stool. Keep the floor dry. Clean up any water that spills on the floor as soon as it happens. Remove soap buildup in the tub or shower regularly. Attach bath mats securely with double-sided non-slip rug tape. Do not have  throw rugs and other things on the floor that can make you trip. What can I do in the bedroom? Use night lights. Make sure that you have a light by your bed that is easy to reach. Do not use any sheets or blankets that are too big for your bed. They should not hang down onto the floor. Have a firm chair that has side arms. You can use this for support while you get dressed. Do not have throw rugs and other things on the floor that can make you trip. What can I do in the kitchen? Clean up any spills right away. Avoid walking on wet floors. Keep items that you use a lot in easy-to-reach places. If you need to reach something above you, use a strong step stool that has a grab bar. Keep electrical cords out of the way. Do not use floor polish or wax that makes floors slippery. If you must use wax, use non-skid floor wax. Do not have throw rugs and other things on the floor that can make you trip. What can I do with my stairs? Do not leave any items on the stairs. Make sure that  there are handrails on both sides of the stairs and use them. Fix handrails that are broken or loose. Make sure that handrails are as long as the stairways. Check any carpeting to make sure that it is firmly attached to the stairs. Fix any carpet that is loose or worn. Avoid having throw rugs at the top or bottom of the stairs. If you do have throw rugs, attach them to the floor with carpet tape. Make sure that you have a light switch at the top of the stairs and the bottom of the stairs. If you do not have them, ask someone to add them for you. What else can I do to help prevent falls? Wear shoes that: Do not have high heels. Have rubber bottoms. Are comfortable and fit you well. Are closed at the toe. Do not wear sandals. If you use a stepladder: Make sure that it is fully opened. Do not climb a closed stepladder. Make sure that both sides of the stepladder are locked into place. Ask someone to hold it for you, if  possible. Clearly mark and make sure that you can see: Any grab bars or handrails. First and last steps. Where the edge of each step is. Use tools that help you move around (mobility aids) if they are needed. These include: Canes. Walkers. Scooters. Crutches. Turn on the lights when you go into a dark area. Replace any light bulbs as soon as they burn out. Set up your furniture so you have a clear path. Avoid moving your furniture around. If any of your floors are uneven, fix them. If there are any pets around you, be aware of where they are. Review your medicines with your doctor. Some medicines can make you feel dizzy. This can increase your chance of falling. Ask your doctor what other things that you can do to help prevent falls. This information is not intended to replace advice given to you by your health care provider. Make sure you discuss any questions you have with your health care provider. Document Released: 10/14/2008 Document Revised: 05/26/2015 Document Reviewed: 01/22/2014 Elsevier Interactive Patient Education  2017 [employer].

## 2022-06-07 NOTE — Progress Notes (Addendum)
I connected with  [name] on 06/07/22 by a audio enabled telemedicine application and verified that I am speaking with the correct person using two identifiers.  Patient Location: Home  Provider Location: Office/Clinic  I discussed the limitations of evaluation and management by telemedicine. The patient expressed understanding and agreed to proceed.  Subjective:   Jason Robles is a 77 y.o. male who presents for Medicare Annual/Subsequent preventive examination.  Review of Systems     Cardiac Risk Factors include: advanced age (>34men, >46 women);dyslipidemia;male gender     Objective:    Today's Vitals   06/07/22 0848  Weight: 176 lb (79.8 kg)  Height: 5\' 11"  (1.803 m)  PainSc: 0-No pain   Body mass index is 24.55 kg/m.     06/07/2022    8:56 AM 12/28/2021    2:07 PM 12/28/2021    5:40 AM 12/19/2021    9:06 AM 07/11/2021    7:47 AM 06/05/2021    9:22 AM 03/14/2016    9:30 AM  Advanced Directives  Does Patient Have a Medical Advance Directive? Yes No Yes Yes Yes Yes Yes  Type of Estate agent of [place];Living will  Healthcare Power of [place];Living will Healthcare Power of [place];Living will  Healthcare Power of [place];Living will Healthcare Power of [place];Living will  Does patient want to make changes to medical advance directive?  No - Patient declined No - Patient declined    No - Patient declined  Copy of Healthcare Power of Attorney in Chart? No - copy requested  No - copy requested   No - copy requested No - copy requested  Would patient like information on creating a medical advance directive?  No - Patient declined         Current Medications (verified) Outpatient Encounter Medications as of 06/07/2022  Medication Sig   Ascorbic Acid (VITAMIN C PO) Take 1 tablet by mouth daily.   aspirin 81 MG EC tablet Take 1 tablet (81 mg total) by mouth daily. Swallow whole.   atorvastatin (LIPITOR) 40 MG tablet Take 1 tablet (40  mg total) by mouth daily.   butalbital-acetaminophen-caffeine (FIORICET) 50-325-40 MG tablet Take 1-2 tablets by mouth every 6 (six) hours as needed for headache. (Patient not taking: Reported on 12/14/2021)   Cyanocobalamin (VITAMIN B-12 PO) Take 1 tablet by mouth daily.   HYDROcodone-acetaminophen (NORCO/VICODIN) 5-325 MG tablet Take 1 tablet by mouth every 4 (four) hours as needed for moderate pain.   ketoconazole (NIZORAL) 2 % cream Apply 1 application. topically daily.   meloxicam (MOBIC) 15 MG tablet TAKE 1 TABLET BY MOUTH EVERY DAY   methocarbamol (ROBAXIN) 500 MG tablet Take 1 tablet (500 mg total) by mouth 3 (three) times daily. (Patient taking differently: Take 500 mg by mouth every 8 (eight) hours as needed for muscle spasms.)   nitroGLYCERIN (NITROSTAT) 0.4 MG SL tablet Place 1 tablet (0.4 mg total) under the tongue every 5 (five) minutes as needed for chest pain.   ondansetron (ZOFRAN) 4 MG tablet Take 1 tablet (4 mg total) by mouth every 8 (eight) hours as needed for vomiting or nausea.   pantoprazole (PROTONIX) 40 MG tablet TAKE 1 TABLET BY MOUTH EVERY DAY   predniSONE (DELTASONE) 10 MG tablet 3 tabs po qd x 2 days, then 2 tabs po qd x 2 days, then 1 tab po qd x 2 days   sodium chloride (OCEAN) 0.65 % SOLN nasal spray Place 1 spray into both nostrils as needed for  congestion (dry sockets).   traMADol (ULTRAM) 50 MG tablet Take 1 tablet (50 mg total) by mouth every 12 (twelve) hours as needed. (Patient taking differently: Take 50 mg by mouth 2 (two) times daily.)   triamcinolone (NASACORT) 55 MCG/ACT AERO nasal inhaler Place 2 sprays into the nose daily.   Ubrogepant (UBRELVY) 100 MG TABS 1 tab by mouth once per day as needed   No facility-administered encounter medications on file as of 06/07/2022.    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   Diverticulosis of colon (without mention of hemorrhage) 04/18/2007   EXTERNAL HEMORRHOIDS 04/18/2007   GERD (gastroesophageal reflux disease)    Headache(784.0) 09/20/2009   hx migraines   HEARING LOSS, 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   HYPERLIPIDEMIA 08/21/2006   INSOMNIA-SLEEP DISORDER-UNSPEC 09/20/2009   NONSPECIFIC ABN FINDING RAD & OTH EXAM GU ORGAN 05/03/2008   PERIPHERAL VASCULAR DISEASE 08/25/2007   Raynaud's syndrome 08/21/2006   Past Surgical History:  Procedure Laterality Date   APPENDECTOMY     COLONOSCOPY  07/25/2006   POLYPECTOMY     REVERSE SHOULDER ARTHROPLASTY Right 12/28/2021   Procedure: REVERSE SHOULDER ARTHROPLASTY;  Surgeon: Yolonda Kida, MD;  Location: [facility];  Service: Orthopedics;  Laterality: Right;  150   s/p knee surgery Bilateral    x's 4 since 1980   s/p left shoulder  2002   rotator cuff   s/p lumbar disc surgery  2007   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   TOTAL KNEE ARTHROPLASTY Right 03/26/2016   Procedure: RIGHT TOTAL KNEE ARTHROPLASTY;  Surgeon: Dannielle Huh, MD;  Location: [facility];  Service: Orthopedics;  Laterality: Right;   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   Procedure: TOTAL KNEE ARTHROPLASTY;  Surgeon: Dannielle Huh, MD;  Location: [facility];  Service: Orthopedics;  Laterality: Left;   UPPER GASTROINTESTINAL ENDOSCOPY     Family History  Problem Relation Age of Onset   Diabetes Mother    Alzheimer's disease Mother    Kidney cancer Brother    Diabetes Maternal Uncle    Colon cancer Neg Hx    Colon polyps Neg Hx    Esophageal cancer Neg Hx    Rectal cancer Neg Hx    Stomach cancer Neg Hx    Social History   Socioeconomic History   Marital status: Married    Spouse name: Not on file   Number of children: Not  on file   Years of education: Not on file   Highest education level: Not on file  Occupational History   Occupation: self employed retail picture framing    Occupation: retired Nurse, adult  Tobacco Use   Smoking status: Former    Packs/day: 1.00    Years: 15.00    Additional pack years: 0.00    Total pack years: 15.00    Types: Cigarettes    Quit date: 01/31/1980    Years since quitting: 42.3    Passive exposure: Never   Smokeless tobacco: Never  Vaping Use   Vaping Use: Never used  Substance and Sexual Activity   Alcohol use: Yes    Alcohol/week: 4.0 standard drinks of alcohol  Types: 4 Glasses of wine per week    Comment: ONCE A WEEK   Drug use: No   Sexual activity: Yes  Other Topics Concern   Not on file  Social History Narrative   [place]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   Right handed   Drinks caffeine   2 story home   Lives in [place]   Social Determinants of Health   Financial Resource Strain: Low Risk  (06/07/2022)   Overall Financial Resource Strain (CARDIA)    Difficulty of Paying Living Expenses: Not hard at all  Food Insecurity: No Food Insecurity (06/07/2022)   Hunger Vital Sign    Worried About Running Out of Food in the Last Year: Never true    Ran Out of Food in the Last Year: Never true  Transportation Needs: No Transportation Needs (06/07/2022)   PRAPARE - Administrator, Civil Service (Medical): No    Lack of Transportation (Non-Medical): No  Physical Activity: Sufficiently Active (06/07/2022)   Exercise Vital Sign    Days of Exercise per Week: 5 days    Minutes of Exercise per Session: 30 min  Stress: No Stress Concern Present (06/07/2022)   [employer] of Occupational Health - Occupational Stress Questionnaire    Feeling of Stress : Not at all  Social Connections: Moderately Isolated (06/07/2022)   Social Connection and Isolation Panel [NHANES]    Frequency of Communication with Friends and Family: Twice a week    Frequency of Social  Gatherings with Friends and Family: Twice a week    Attends Religious Services: Never    Database administrator or Organizations: No    Attends Engineer, structural: Never    Marital Status: Married    Tobacco Counseling Counseling given: Not Answered   Clinical Intake:  Pre-visit preparation completed: Yes  Pain : No/denies pain Pain Score: 0-No pain     BMI - recorded: 24.55 Nutritional Status: BMI of 19-24  Normal Nutritional Risks: None Diabetes: No  How often do you need to have someone help you when you read instructions, pamphlets, or other written materials from your doctor or pharmacy?: 1 - Never What is the last grade level you completed in school?: Military  Diabetic? No  Interpreter Needed?: No  Information entered by :: Oseas Detty N. Baylin Cabal, LPN.   Activities of Daily Living    06/07/2022    8:57 AM 12/28/2021    2:07 PM  In your present state of health, do you have any difficulty performing the following activities:  Hearing? 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 0 0  Difficulty concentrating or making decisions? 0 0  Walking or climbing stairs? 0 0  Dressing or bathing? 0 0  Doing errands, shopping? 0 0  Preparing Food and eating ? N   Using the Toilet? N   In the past six months, have you accidently leaked urine? N   Do you have problems with loss of bowel control? N   Managing your Medications? N   Managing your Finances? N   Housekeeping or managing your Housekeeping? N     Patient Care Team: Corwin Levins, MD as PCP - General  Indicate any recent Medical Services you may have received from other than [facility] providers in the past year (date may be approximate).     Assessment:   This is a routine wellness examination for Mohab.  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 Vision Screening - Comments:: Wears rx glasses - up to date with routine eye exams with  [facility] with Leida Lauth, OD.   Dietary issues and exercise activities discussed: Current Exercise Habits: Home exercise routine, Type of exercise: walking, Time (Minutes): 30, Frequency (Times/Week): 5, Weekly Exercise (Minutes/Week): 150, Intensity: Moderate, Exercise limited by: orthopedic condition(s)   Goals Addressed             This Visit's Progress    My goal for 2024 is to maintain my health.        Depression Screen    06/07/2022    8:54 AM 11/03/2021    9:16 AM 06/06/2021    9:45 AM 06/06/2021    9:06 AM 06/05/2021    9:23 AM 06/05/2021    9:21 AM 07/27/2020   10:44 AM  PHQ 2/9 Scores  PHQ - 2 Score 0 0 0 0 0 0 0  PHQ- 9 Score 0 0  1       Fall Risk    06/07/2022    8:57 AM 11/03/2021    9:16 AM 07/11/2021    7:47 AM 06/06/2021    9:44 AM 06/06/2021    9:06 AM  Fall Risk   Falls in the past year? 0 0 0 0 0  Number falls in past yr: 0  0 0 0  Injury with Fall? 0  0 0 0  Risk for fall due to : No Fall Risks No Fall Risks     Follow up Falls prevention discussed Falls evaluation completed       FALL RISK PREVENTION PERTAINING TO THE HOME:  Any stairs in or around the home? Yes  If so, are there any without handrails? No  Home free of loose throw rugs in walkways, pet beds, electrical cords, etc? Yes  Adequate lighting in your home to reduce risk of falls? Yes   ASSISTIVE DEVICES UTILIZED TO PREVENT FALLS:  Life alert? No  Use of a cane, walker or w/c? No  Grab bars in the bathroom? Yes  Shower chair or bench in shower? Yes  Elevated toilet seat or a handicapped toilet? Yes   TIMED UP AND GO:  Was the test performed? No . Telephonic Visit  Cognitive Function:      07/11/2021    9:00 AM  Montreal Cognitive Assessment   Visuospatial/ Executive (0/5) 3  Naming (0/3) 3  Attention: Read list of digits (0/2) 2  Attention: Read list of letters (0/1) 1  Attention: Serial 7 subtraction starting at 100 (0/3) 3  Language: Repeat phrase (0/2) 2  Language :  Fluency (0/1) 1  Abstraction (0/2) 2  Delayed Recall (0/5) 3  Orientation (0/6) 6  Total 26  Adjusted Score (based on education) 26      06/07/2022    8:58 AM  6CIT Screen  What Year? 0 points  What month? 0 points  What time? 0 points  Count back from 20 0 points  Months in reverse 0 points  Repeat phrase 0 points  Total Score 0 points    Immunizations Immunization History  Administered Date(s) Administered   Fluad Quad(high Dose 65+) 09/09/2019, 11/03/2021   Influenza, High Dose Seasonal PF 09/12/2016   Influenza, Quadrivalent, Recombinant, Inj, Pf 11/01/2018   Influenza-Unspecified 11/06/2015   PFIZER Comirnaty(Gray Top)Covid-19 Tri-Sucrose Vaccine 05/18/2020   PFIZER(Purple Top)SARS-COV-2 Vaccination 02/22/2019, 03/18/2019, 10/01/2019, 05/18/2020   PNEUMOCOCCAL CONJUGATE-20 07/17/2021   Pfizer Covid-19 Vaccine Bivalent Booster 76yrs & up  07/24/2021   Pneumococcal Conjugate-13 07/31/2018   Pneumococcal Polysaccharide-23 11/06/2015   Pneumococcal-Unspecified 11/06/2015   Tdap 09/28/2010, 07/17/2021   Zoster Recombinat (Shingrix) 07/17/2021, 10/02/2021   Zoster, Live 09/28/2010    TDAP status: Up to date  Flu Vaccine status: Up to date  Pneumococcal vaccine status: Up to date  Covid-19 vaccine status: Completed vaccines  Qualifies for Shingles Vaccine? Yes   Zostavax completed Yes   Shingrix Completed?: No.    Education has been provided regarding the importance of this vaccine. Patient has been advised to call insurance company to determine out of pocket expense if they have not yet received this vaccine. Advised may also receive vaccine at local pharmacy or Health Dept. Verbalized acceptance and understanding.  Screening Tests Health Maintenance  Topic Date Due   COVID-19 Vaccine (7 - 2023-24 season) 09/18/2021   INFLUENZA VACCINE  08/02/2022   Medicare Annual Wellness (AWV)  06/07/2023   Colonoscopy  03/14/2026   DTaP/Tdap/Td (3 - Td or Tdap) 07/18/2031    Pneumonia Vaccine 63+ Years old  Completed   Hepatitis C Screening  Completed   Zoster Vaccines- Shingrix  Completed   HPV VACCINES  Aged Out    Health Maintenance  Health Maintenance Due  Topic Date Due   COVID-19 Vaccine (7 - 2023-24 season) 09/18/2021    Colorectal cancer screening: Type of screening: Colonoscopy. Completed 03/13/2021. Repeat every 5 years  Lung Cancer Screening: (Low Dose CT Chest recommended if Age 15-80 years, 30 pack-year currently smoking OR have quit w/in 15years.) does not qualify.   Lung Cancer Screening Referral: no  Additional Screening:  Hepatitis C Screening: does qualify; Completed 09/12/2016  Vision Screening: Recommended annual ophthalmology exams for early detection of glaucoma and other disorders of the eye. Is the patient up to date with their annual eye exam?  Yes  Who is the provider or what is the name of the office in which the patient attends annual eye exams? [facility] Leida Lauth, OD.) If pt is not established with a provider, would they like to be referred to a provider to establish care? No .   Dental Screening: Recommended annual dental exams for proper oral hygiene  Community Resource Referral / Chronic Care Management: CRR required this visit?  No   CCM required this visit?  No      Plan:     I have personally reviewed and noted the following in the patient's chart:   Medical and social history Use of alcohol, tobacco or illicit drugs  Current medications and supplements including opioid prescriptions. Patient is currently taking opioid prescriptions. Information provided to patient regarding non-opioid alternatives. Patient advised to discuss non-opioid treatment plan with their provider. Functional ability and status Nutritional status Physical activity Advanced directives List of other physicians Hospitalizations, surgeries, and ER visits in previous 12 months Vitals Screenings to include cognitive,  depression, and falls Referrals and appointments  In addition, I have reviewed and discussed with patient certain preventive protocols, quality metrics, and best practice recommendations. A written personalized care plan for preventive services as well as general preventive health recommendations were provided to patient.     Mickeal Needy, LPN   01/06/1094   Nurse Notes: Normal cognitive status assessed by direct observation via telephone conversation by this Nurse Health Advisor. No abnormalities found.   Medical screening examination/treatment/procedure(s) were performed by non-physician practitioner and as supervising physician I was immediately available for consultation/collaboration.  I agree with above. Jacinta Shoe, MD

## 2022-06-14 ENCOUNTER — Ambulatory Visit: Payer: Medicare HMO | Admitting: Internal Medicine

## 2022-06-14 DIAGNOSIS — R200 Anesthesia of skin: Secondary | ICD-10-CM | POA: Diagnosis not present

## 2022-06-14 DIAGNOSIS — M13831 Other specified arthritis, right wrist: Secondary | ICD-10-CM | POA: Diagnosis not present

## 2022-06-14 DIAGNOSIS — M720 Palmar fascial fibromatosis [Dupuytren]: Secondary | ICD-10-CM | POA: Diagnosis not present

## 2022-06-14 DIAGNOSIS — M67431 Ganglion, right wrist: Secondary | ICD-10-CM | POA: Diagnosis not present

## 2022-06-20 DIAGNOSIS — L820 Inflamed seborrheic keratosis: Secondary | ICD-10-CM | POA: Diagnosis not present

## 2022-06-27 ENCOUNTER — Ambulatory Visit (INDEPENDENT_AMBULATORY_CARE_PROVIDER_SITE_OTHER): Payer: Medicare HMO | Admitting: Internal Medicine

## 2022-06-27 ENCOUNTER — Encounter: Payer: Self-pay | Admitting: Internal Medicine

## 2022-06-27 VITALS — BP 124/76 | HR 70 | Temp 98.1°F | Ht 71.0 in | Wt 176.0 lb

## 2022-06-27 DIAGNOSIS — R202 Paresthesia of skin: Secondary | ICD-10-CM

## 2022-06-27 DIAGNOSIS — Z0001 Encounter for general adult medical examination with abnormal findings: Secondary | ICD-10-CM | POA: Diagnosis not present

## 2022-06-27 DIAGNOSIS — M549 Dorsalgia, unspecified: Secondary | ICD-10-CM | POA: Diagnosis not present

## 2022-06-27 DIAGNOSIS — E7800 Pure hypercholesterolemia, unspecified: Secondary | ICD-10-CM | POA: Diagnosis not present

## 2022-06-27 DIAGNOSIS — E559 Vitamin D deficiency, unspecified: Secondary | ICD-10-CM

## 2022-06-27 DIAGNOSIS — E785 Hyperlipidemia, unspecified: Secondary | ICD-10-CM | POA: Diagnosis not present

## 2022-06-27 DIAGNOSIS — Z125 Encounter for screening for malignant neoplasm of prostate: Secondary | ICD-10-CM

## 2022-06-27 DIAGNOSIS — E538 Deficiency of other specified B group vitamins: Secondary | ICD-10-CM

## 2022-06-27 DIAGNOSIS — R739 Hyperglycemia, unspecified: Secondary | ICD-10-CM

## 2022-06-27 HISTORY — DX: Paresthesia of skin: R20.2

## 2022-06-27 HISTORY — DX: Dorsalgia, unspecified: M54.9

## 2022-06-27 LAB — CBC WITH DIFFERENTIAL/PLATELET
Basophils Absolute: 0 10*3/uL (ref 0.0–0.1)
Basophils Relative: 0.4 % (ref 0.0–3.0)
Eosinophils Absolute: 0.1 10*3/uL (ref 0.0–0.7)
Eosinophils Relative: 1.8 % (ref 0.0–5.0)
HCT: 40.6 % (ref 39.0–52.0)
Hemoglobin: 13.5 g/dL (ref 13.0–17.0)
Lymphocytes Relative: 28.6 % (ref 12.0–46.0)
Lymphs Abs: 1.7 10*3/uL (ref 0.7–4.0)
MCHC: 33.2 g/dL (ref 30.0–36.0)
MCV: 86.8 fl (ref 78.0–100.0)
Monocytes Absolute: 0.4 10*3/uL (ref 0.1–1.0)
Monocytes Relative: 6.3 % (ref 3.0–12.0)
Neutro Abs: 3.8 10*3/uL (ref 1.4–7.7)
Neutrophils Relative %: 62.9 % (ref 43.0–77.0)
Platelets: 191 10*3/uL (ref 150.0–400.0)
RBC: 4.68 Mil/uL (ref 4.22–5.81)
RDW: 14.6 % (ref 11.5–15.5)
WBC: 6.1 10*3/uL (ref 4.0–10.5)

## 2022-06-27 LAB — BASIC METABOLIC PANEL
BUN: 26 mg/dL — ABNORMAL HIGH (ref 6–23)
CO2: 37 mEq/L — ABNORMAL HIGH (ref 19–32)
Calcium: 10.4 mg/dL (ref 8.4–10.5)
Chloride: 100 mEq/L (ref 96–112)
Creatinine, Ser: 1.06 mg/dL (ref 0.40–1.50)
GFR: 68.85 mL/min (ref >60.00)
Glucose, Bld: 111 mg/dL — ABNORMAL HIGH (ref 70–99)
Potassium: 4.4 mEq/L (ref 3.5–5.1)
Sodium: 140 mEq/L (ref 135–145)

## 2022-06-27 LAB — VITAMIN B12: Vitamin B-12: 248 pg/mL (ref 211–911)

## 2022-06-27 LAB — VITAMIN D 25 HYDROXY (VIT D DEFICIENCY, FRACTURES): VITD: 25.21 ng/mL — ABNORMAL LOW (ref 30.00–100.00)

## 2022-06-27 LAB — LIPID PANEL
Cholesterol: 159 mg/dL (ref 0–200)
HDL: 54.6 mg/dL (ref >39.00)
LDL Cholesterol: 75 mg/dL (ref 0–99)
NonHDL: 104.7
Total CHOL/HDL Ratio: 3
Triglycerides: 149 mg/dL (ref 0.0–149.0)
VLDL: 29.8 mg/dL (ref 0.0–40.0)

## 2022-06-27 LAB — MICROALBUMIN / CREATININE URINE RATIO
Creatinine,U: 118.9 mg/dL
Microalb Creat Ratio: 0.7 mg/g (ref 0.0–30.0)
Microalb, Ur: 0.8 mg/dL (ref 0.0–1.9)

## 2022-06-27 LAB — HEPATIC FUNCTION PANEL
ALT: 20 U/L (ref 0–53)
AST: 20 U/L (ref 0–37)
Albumin: 4.3 g/dL (ref 3.5–5.2)
Alkaline Phosphatase: 64 U/L (ref 39–117)
Bilirubin, Direct: 0.1 mg/dL (ref 0.0–0.3)
Total Bilirubin: 0.6 mg/dL (ref 0.2–1.2)
Total Protein: 6.9 g/dL (ref 6.0–8.3)

## 2022-06-27 LAB — HEMOGLOBIN A1C: Hgb A1c MFr Bld: 6 % (ref 4.6–6.5)

## 2022-06-27 LAB — TSH: TSH: 1.13 u[IU]/mL (ref 0.35–5.50)

## 2022-06-27 LAB — PSA: PSA: 2.41 ng/mL (ref 0.10–4.00)

## 2022-06-27 NOTE — Progress Notes (Signed)
Patient ID: Jason Robles, male   DOB: 1945/01/15, 77 y.o.   MRN: [id number]         Chief Complaint:: wellness exam and Tingling (In left arm has been going on for about a month , was also getting pain in collar bone )  , hld, hyperglycemia, upper back pain on left side, low vit d, low b12       HPI:  Jason Robles is a 77 y.o. 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   Pt denies polydipsia, polyuria, or new focal neuro s/s.    Pt denies fever, wt loss, night sweats, loss of appetite, or other constitutional symptoms  Pt denies chest pain, increased sob or doe, wheezing, orthopnea, PND, increased LE swelling, palpitations, dizziness or syncope.      Wt Readings from Last 3 Encounters:  06/27/22 176 lb (79.8 kg)  06/07/22 176 lb (79.8 kg)  05/25/22 179 lb (81.2 kg)   BP Readings from Last 3 Encounters:  06/27/22 124/76  05/25/22 126/80  12/29/21 105/71   Immunization History  Administered Date(s) Administered   Fluad Quad(high Dose 65+) 09/09/2019, 11/03/2021   Influenza, High Dose Seasonal PF 09/12/2016   Influenza, Quadrivalent, Recombinant, Inj, Pf 11/01/2018   Influenza-Unspecified 11/06/2015   PFIZER Comirnaty(Gray Top)Covid-19 Tri-Sucrose Vaccine 05/18/2020   PFIZER(Purple Top)SARS-COV-2 Vaccination 02/22/2019, 03/18/2019, 10/01/2019, 05/18/2020   PNEUMOCOCCAL CONJUGATE-20 07/17/2021   Pfizer Covid-19 Vaccine Bivalent Booster 47yrs & up 07/24/2021   Pneumococcal Conjugate-13 07/31/2018   Pneumococcal Polysaccharide-23 11/06/2015   Pneumococcal-Unspecified 11/06/2015   Tdap 09/28/2010, 07/17/2021   Zoster Recombinant(Shingrix) 07/17/2021, 10/02/2021   Zoster, Live 09/28/2010    There are no preventive care reminders to display for this patient.     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   Diverticulosis of colon (without mention of hemorrhage) 04/18/2007   EXTERNAL HEMORRHOIDS 04/18/2007   GERD (gastroesophageal reflux disease)    Headache(784.0) 09/20/2009   hx migraines   HEARING LOSS, 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   HYPERLIPIDEMIA 08/21/2006   INSOMNIA-SLEEP DISORDER-UNSPEC 09/20/2009   NONSPECIFIC ABN FINDING RAD & OTH EXAM GU ORGAN 05/03/2008   PERIPHERAL VASCULAR DISEASE 08/25/2007   Raynaud's syndrome 08/21/2006   Past Surgical History:  Procedure Laterality Date   APPENDECTOMY     COLONOSCOPY  07/25/2006   POLYPECTOMY     REVERSE SHOULDER ARTHROPLASTY Right 12/28/2021   Procedure: REVERSE SHOULDER ARTHROPLASTY;  Surgeon: Yolonda Kida, MD;  Location: [facility];  Service: Orthopedics;  Laterality: Right;  150   s/p knee surgery Bilateral    x's 4 since 1980   s/p left shoulder  2002   rotator cuff   s/p lumbar disc surgery  2007   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 His smoking use included cigarettes. He has a 15.00 pack-year smoking history. He has never been exposed to tobacco smoke. He has never  used smokeless tobacco. He reports current alcohol use of about 4.0 standard drinks of alcohol per week. He reports that he does not use drugs. family history includes Alzheimer's disease in his mother; Diabetes in his maternal uncle and mother; Kidney cancer in his brother. Allergies  Allergen Reactions   Iohexol Itching    Itching post 125cc Omni injection. Treated w/ 50 mg PO benedryl.  Onset Date: [id number]    Current Outpatient Medications on File Prior to Visit  Medication Sig Dispense Refill   Ascorbic Acid (VITAMIN C PO) Take 1 tablet by mouth daily.     aspirin 81 MG EC tablet Take 1 tablet (81 mg total) by mouth daily. Swallow whole. 30 tablet 12   atorvastatin (LIPITOR) 40 MG tablet Take 1 tablet (40 mg total) by mouth daily. 90 tablet 3   Cyanocobalamin (VITAMIN B-12 PO) Take 1 tablet by mouth daily.     HYDROcodone-acetaminophen (NORCO/VICODIN) 5-325 MG tablet Take 1 tablet by mouth every 4 (four) hours as needed for moderate pain. 20 tablet 0   ketoconazole (NIZORAL) 2 % cream Apply 1 application. topically daily. 30 g 2   meloxicam (MOBIC) 15 MG tablet TAKE 1 TABLET BY MOUTH EVERY DAY 90 tablet 2   methocarbamol (ROBAXIN) 500 MG tablet Take 1 tablet (500 mg total) by mouth 3 (three) times daily. (Patient taking differently: Take 500 mg by mouth every 8 (eight) hours as needed for muscle spasms.) 90 tablet 2   nitroGLYCERIN (NITROSTAT) 0.4 MG SL tablet Place 1 tablet (0.4 mg total) under the tongue every 5 (five) minutes as needed for chest pain. 25 tablet 3   ondansetron (ZOFRAN) 4 MG tablet Take 1 tablet (4 mg total) by mouth every 8 (eight) hours as needed for vomiting or nausea. 20 tablet 0   pantoprazole (PROTONIX) 40 MG tablet TAKE 1 TABLET BY MOUTH EVERY DAY 90 tablet 3   predniSONE (DELTASONE) 10 MG tablet 3 tabs po qd x 2 days, then 2 tabs po qd x 2 days, then 1 tab po qd x 2 days 12 tablet 0   sodium chloride (OCEAN) 0.65 % SOLN nasal spray Place 1 spray into both  nostrils as needed for congestion (dry sockets).     traMADol (ULTRAM) 50 MG tablet Take 1 tablet (50 mg total) by mouth every 12 (twelve) hours as needed. (Patient taking differently: Take 50 mg by mouth 2 (two) times daily.) 60 tablet 3   triamcinolone (NASACORT) 55 MCG/ACT AERO nasal inhaler Place 2 sprays into the nose daily. 1 Inhaler 12   Ubrogepant (UBRELVY) 100 MG TABS 1 tab by mouth once per day as needed 30 tablet 5   No current facility-administered medications on file prior to visit.        ROS:  All others reviewed and negative.  Objective        PE:  BP 124/76 (BP Location: Right Arm, Patient Position: Sitting, Cuff Size: Normal)   Pulse 70  Temp 98.1 F (36.7 C) (Oral)   Ht 5\' 11"  (1.803 m)   Wt 176 lb (79.8 kg)   SpO2 98%   BMI 24.55 kg/m                 Constitutional: Pt appears in NAD               HENT: Head: NCAT.                Right Ear: External ear normal.                 Left Ear: External ear normal.                Eyes: . Pupils are equal, round, and reactive to light. Conjunctivae and EOM are normal               Nose: without d/c or deformity               Neck: Neck supple. Gross normal ROM               Cardiovascular: Normal rate and regular rhythm.                 Pulmonary/Chest: Effort normal and breath sounds without rales or wheezing.                Abd:  Soft, NT, ND, + BS, no organomegaly;  also with tender left rhomboid area just medial to left scapula               Neurological: Pt is alert. At baseline orientation, motor grossly intact               Skin: Skin is warm. No rashes, no other new lesions, LE edema - none               Psychiatric: Pt behavior is normal without agitation   Micro: none  Cardiac tracings I have personally interpreted today:  none  Pertinent Radiological findings (summarize): none   Lab Results  Component Value Date   WBC 6.1 06/27/2022   HGB 13.5 06/27/2022   HCT 40.6 06/27/2022   PLT 191.0 06/27/2022    GLUCOSE 111 (H) 06/27/2022   CHOL 159 06/27/2022   TRIG 149.0 06/27/2022   HDL 54.60 06/27/2022   LDLDIRECT 100.0 07/14/2018   LDLCALC 75 06/27/2022   ALT 20 06/27/2022   AST 20 06/27/2022   NA 140 06/27/2022   K 4.4 06/27/2022   CL 100 06/27/2022   CREATININE 1.06 06/27/2022   BUN 26 (H) 06/27/2022   CO2 37 (H) 06/27/2022   TSH 1.13 06/27/2022   PSA 2.41 06/27/2022   INR 0.89 05/02/2012   HGBA1C 6.0 06/27/2022   MICROALBUR 0.8 06/27/2022   Assessment/Plan:  Jason Robles is a 77 y.o. Other or two or more races [6] male with  has a past medical history of ABDOMINAL PAIN OTHER SPECIFIED SITE (04/30/2008), ALLERGIC RHINITIS (08/25/2007), Anxiety (06/28/2010), BENIGN PROSTATIC HYPERTROPHY (08/21/2006), Chest pain (06/28/2010), COLONIC POLYPS, HX OF (04/18/2007), Complication of anesthesia, DEGENERATIVE JOINT DISEASE, KNEES, BILATERAL (08/21/2006), Diverticulosis of colon (without mention of hemorrhage) (04/18/2007), EXTERNAL HEMORRHOIDS (04/18/2007), GERD (gastroesophageal reflux disease), Headache(784.0) (09/20/2009), HEARING LOSS, RIGHT EAR (08/21/2006), HYPERLIPIDEMIA (08/21/2006), INSOMNIA-SLEEP DISORDER-UNSPEC (09/20/2009), NONSPECIFIC ABN FINDING RAD & OTH EXAM GU ORGAN (05/03/2008), PERIPHERAL VASCULAR DISEASE (08/25/2007), and Raynaud's syndrome (08/21/2006).  Encounter for well adult exam with abnormal findings Age and sex appropriate education and counseling updated  with regular exercise and diet Referrals for preventative services - none needed Immunizations addressed - declines covid booster Smoking counseling  - none needed Evidence for depression or other mood disorder - none significant Most recent labs reviewed. I have personally reviewed and have noted: 1) the patient's medical and social history 2) The patient's current medications and supplements 3) The patient's height, weight, and BMI have been recorded in the chart   Hyperlipidemia Lab Results   Component Value Date   LDLCALC 75 06/27/2022   uncontrolled, goal ldl < 70 , pt to cont lipitor 40 every day and lower chol diet, declines other change for now   Hyperglycemia Lab Results  Component Value Date   HGBA1C 6.0 06/27/2022   Stable, pt to continue current medical treatment  - diet, wt control   Arm paresthesia, left Mild, exam benign, I suspect c spine vs ulnar neuritis,  to f/u any worsening symptoms or concerns  Upper back pain on left side 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  Oliver Barre, MD 06/30/2022 3:41 PM [facility]

## 2022-06-27 NOTE — Patient Instructions (Signed)

## 2022-06-28 LAB — URINALYSIS, ROUTINE W REFLEX MICROSCOPIC
Bilirubin Urine: NEGATIVE
Hgb urine dipstick: NEGATIVE
Ketones, ur: NEGATIVE
Leukocytes,Ua: NEGATIVE
Nitrite: NEGATIVE
RBC / HPF: NONE SEEN (ref 0–?)
Specific Gravity, Urine: 1.02 (ref 1.000–1.030)
Total Protein, Urine: NEGATIVE
Urine Glucose: NEGATIVE
Urobilinogen, UA: 1 (ref 0.0–1.0)
pH: 7 (ref 5.0–8.0)

## 2022-06-28 NOTE — Progress Notes (Signed)
The test results show that your current treatment is OK, as the tests are stable.  Please continue the same plan.  There is no other need for change of treatment or further evaluation based on these results, at this time.  thanks 

## 2022-06-30 ENCOUNTER — Encounter: Payer: Self-pay | Admitting: Internal Medicine

## 2022-06-30 NOTE — Assessment & Plan Note (Signed)
Last vitamin D Lab Results  Component Value Date   VD25OH 25.21 (L) 06/27/2022   Low, to start oral replacement

## 2022-06-30 NOTE — Assessment & Plan Note (Signed)

## 2022-06-30 NOTE — Assessment & Plan Note (Signed)
Lab Results  Component Value Date   HGBA1C 6.0 06/27/2022   Stable, pt to continue current medical treatment  - diet, wt control

## 2022-06-30 NOTE — Assessment & Plan Note (Signed)
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

## 2022-06-30 NOTE — Assessment & Plan Note (Signed)
Lab Results  Component Value Date   VITAMINB12 248 06/27/2022   Low, to start oral replacement - b12 1000 mcg qd

## 2022-06-30 NOTE — Assessment & Plan Note (Signed)
Mild, exam benign, I suspect c spine vs ulnar neuritis,  to f/u any worsening symptoms or concerns

## 2022-06-30 NOTE — Assessment & Plan Note (Signed)
Lab Results  Component Value Date   LDLCALC 75 06/27/2022   uncontrolled, goal ldl < 70 , pt to cont lipitor 40 every day and lower chol diet, declines other change for now

## 2022-07-18 ENCOUNTER — Ambulatory Visit (INDEPENDENT_AMBULATORY_CARE_PROVIDER_SITE_OTHER): Payer: Medicare HMO | Admitting: Psychology

## 2022-07-18 ENCOUNTER — Ambulatory Visit: Payer: Medicare HMO

## 2022-07-18 ENCOUNTER — Encounter: Payer: Self-pay | Admitting: Psychology

## 2022-07-18 DIAGNOSIS — F4312 Post-traumatic stress disorder, chronic: Secondary | ICD-10-CM

## 2022-07-18 DIAGNOSIS — G3184 Mild cognitive impairment, so stated: Secondary | ICD-10-CM

## 2022-07-18 DIAGNOSIS — R4189 Other symptoms and signs involving cognitive functions and awareness: Secondary | ICD-10-CM

## 2022-07-18 HISTORY — DX: Mild cognitive impairment of uncertain or unknown etiology: G31.84

## 2022-07-18 NOTE — Progress Notes (Signed)
   Psychometrician Note   Cognitive testing was administered to Jason Robles by Shan Levans, B.S. (psychometrist) under the supervision of Dr. Newman Nickels, Ph.D., licensed psychologist on 07/18/2022. Jason Robles did not appear overtly distressed by the testing session per behavioral observation or responses across self-report questionnaires. Rest breaks were offered.    The battery of tests administered was selected by Dr. Newman Nickels, Ph.D. with consideration to Jason Robles's current level of functioning, the nature of his symptoms, emotional and behavioral responses during interview, level of literacy, observed level of motivation/effort, and the nature of the referral question. This battery was communicated to the psychometrist. Communication between Dr. Newman Nickels, Ph.D. and the psychometrist was ongoing throughout the evaluation and Dr. Newman Nickels, Ph.D. was immediately accessible at all times. Dr. Newman Nickels, Ph.D. provided supervision to the psychometrist on the date of this service to the extent necessary to assure the quality of all services provided.    Jason Robles will return within approximately 1-2 weeks for an interactive feedback session with Dr. Milbert Coulter at which time his test performances, clinical impressions, and treatment recommendations will be reviewed in detail. Jason Robles understands he can contact our office should he require our assistance before this time.  A total of 125 minutes of billable time were spent face-to-face with Jason Robles by the psychometrist. This includes both test administration and scoring time. Billing for these services is reflected in the clinical report generated by Dr. Newman Nickels, Ph.D.  This note reflects time spent with the psychometrician and does not include test scores or any clinical interpretations made by Dr. Milbert Coulter. The full report will follow in a separate note.

## 2022-07-18 NOTE — Progress Notes (Signed)
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 Average        Scaled Score Percentile   WAIS-IV Similarities: 10 50 Average       D-KEFS Color-Word Interference Test: Raw Score (Scaled Score) Percentile     Color Naming 39 secs. (8) 25 Average    Word Reading 28 secs. (9) 37 Average    Inhibition 68 secs. (11) 63 Average      Total Errors 1 error (12) 75 Above Average    Inhibition/Switching 141 secs. (2) <1 Exceptionally Low      Total Errors 4 errors (9) 37 Average       D-KEFS Verbal Fluency Test: Raw Score (Scaled Score) Percentile     Letter Total Correct 41 (12) 75 Above Average    Category Total Correct 36 (11) 63 Average    Category Switching Total Correct 8 (5) 5 Well Below Average    Category Switching Accuracy 7 (6) 9 Below Average      Total Set Loss Errors 6 (5) 5 Well Below Average      Total Repetition Errors 16 (1) <1 Exceptionally Low       NAB Executive Functions Module, Form 1: T Score Percentile     Judgment 64 92 Well Above Average        Language:     Verbal Fluency Test: Raw Score (T Score) Percentile     Phonemic Fluency (FAS) 41 (49) 46 Average    Animal Fluency 20 (53) 62 Average        NAB Language Module, Form 1: T Score Percentile     Auditory Comprehension 56 73 Average    Naming 31/31 (59) 82 Above Average       Visuospatial/Visuoconstruction:      Raw Score Percentile   Clock Drawing: 9/10 --- Within Normal Limits  Scaled Score Percentile   WAIS-IV Block Design: 9 37 Average  WAIS-IV Matrix Reasoning: 11 63 Average       Mood and Personality:      Raw Score Percentile   Beck Depression Inventory - II: 14 --- Mild  PROMIS Anxiety Questionnaire: 17 --- Mild       Additional Questionnaires:      Raw Score Percentile   PROMIS Sleep Disturbance Questionnaire: 25 --- Mild   Informed Consent and Coding/Compliance:   The current evaluation represents a clinical evaluation for the purposes previously outlined by the referral source and is in no way reflective of a forensic evaluation.   Jason Robles. Carpenito was provided with a verbal description of the nature and purpose of the present neuropsychological evaluation. Also reviewed were the foreseeable risks and/or discomforts and benefits of the procedure, limits of confidentiality, and mandatory reporting requirements of this provider. The patient was given the opportunity to ask questions and receive answers about the evaluation. Oral consent to participate was provided by the patient.   This evaluation was conducted by Newman Nickels, Ph.D., ABPP-CN, board certified clinical neuropsychologist. Jason Robles. [place]way completed a clinical interview with Dr. Milbert Coulter, billed as one unit [phone], and 125 minutes of cognitive testing and scoring, billed as one unit [phone] and three additional units [id number]. Psychometrist Shan Levans, B.S. assisted Dr. Milbert Coulter with test administration and scoring procedures. As a separate and discrete service, one unit [id number] and two units [phone] were  billed for Dr. Tammy Sours time spent in interpretation and report writing.

## 2022-07-19 ENCOUNTER — Encounter: Payer: Self-pay | Admitting: Psychology

## 2022-07-25 ENCOUNTER — Encounter: Payer: Self-pay | Admitting: Psychology

## 2022-07-25 ENCOUNTER — Encounter: Payer: Medicare HMO | Admitting: Psychology

## 2022-08-01 ENCOUNTER — Ambulatory Visit: Payer: Medicare HMO | Admitting: Psychology

## 2022-08-01 DIAGNOSIS — F4312 Post-traumatic stress disorder, chronic: Secondary | ICD-10-CM | POA: Diagnosis not present

## 2022-08-01 DIAGNOSIS — G3184 Mild cognitive impairment, so stated: Secondary | ICD-10-CM

## 2022-08-01 NOTE — Progress Notes (Signed)
   Neuropsychology Feedback Session Jason Robles. [facility]  Department of Neurology  Reason for Referral:   GRABIEL Robles is a 77 y.o. right-handed Caucasian male referred by Marlowe Kays, PA-C, to characterize his current cognitive functioning and assist with diagnostic clarity and treatment planning in the context of subjective cognitive decline.   Feedback:   Jason Robles completed a comprehensive neuropsychological evaluation on 07/18/2022. Please refer to that encounter for the full report and recommendations. Briefly, results suggested a consistent impairment surrounding encoding (i.e., learning) aspects of verbal memory. Further performance variability was exhibited across executive functioning and both delayed retrieval and recognition/consolidation aspects of verbal and visual memory. The etiology for ongoing cognitive weakness/variability is unclear at the present time. 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 Especially when in combination, cognitive difficulties surrounding executive functioning and memory are relatively commonplace. Neurologically speaking, Jason Robles described a positive family history of Alzheimer's disease. The extremely early beginnings of this illness cannot be ruled out. Across delayed memory testing, retention rates were 20% and 31% across list and figure-based tasks, which could be worrisome for rapid forgetting. He likewise had trouble across yes/no recognition trials, suggesting some storage dysfunction. However, his retention rate across story-based content was 100% and he performed well across recognition trials. He also performed well across non-memory domains  which are commonly impacted early on in this disease process. Overall, it would appear premature to attribute memory loss to the presence of this illness.  Jason Robles was unaccompanied during the current feedback session. Content of the current session focused on the results of his neuropsychological evaluation. Jason Robles was given the opportunity to ask questions and his questions were answered. He was encouraged to reach out should additional questions arise. A copy of his report was provided at the conclusion of the visit.      One unit [phone] was billed for Dr. Tammy Sours time spent preparing for, conducting, and documenting the current feedback session with Mr. [name].

## 2022-08-02 ENCOUNTER — Ambulatory Visit (INDEPENDENT_AMBULATORY_CARE_PROVIDER_SITE_OTHER): Payer: Medicare HMO | Admitting: Internal Medicine

## 2022-08-02 ENCOUNTER — Telehealth: Payer: Self-pay

## 2022-08-02 ENCOUNTER — Encounter: Payer: Self-pay | Admitting: Internal Medicine

## 2022-08-02 VITALS — BP 120/82 | HR 77 | Temp 98.2°F | Ht 71.0 in | Wt 173.0 lb

## 2022-08-02 DIAGNOSIS — E559 Vitamin D deficiency, unspecified: Secondary | ICD-10-CM

## 2022-08-02 DIAGNOSIS — R739 Hyperglycemia, unspecified: Secondary | ICD-10-CM

## 2022-08-02 DIAGNOSIS — F5101 Primary insomnia: Secondary | ICD-10-CM | POA: Diagnosis not present

## 2022-08-02 DIAGNOSIS — G4710 Hypersomnia, unspecified: Secondary | ICD-10-CM

## 2022-08-02 MED ORDER — ZOLPIDEM TARTRATE 10 MG PO TABS: 10.0000 mg | ORAL_TABLET | Freq: Every evening | ORAL | 1 refills | Status: DC | PRN

## 2022-08-02 NOTE — Telephone Encounter (Signed)
I think he means the ultram and mobic - ok to continue all medications as prescribed

## 2022-08-02 NOTE — Patient Instructions (Signed)
Please take all new medication as prescribed  - the ambien for sleep as needed  Please continue all other medications as before, and refills have been done if requested.  Please have the pharmacy call with any other refills you may need.  Please continue your efforts at being more active, low cholesterol diet, and weight control.  Please keep your appointments with your specialists as you may have planned  You will be contacted regarding the referral for: Pulmonary for possible sleep apnea

## 2022-08-02 NOTE — Progress Notes (Signed)
Patient ID: IZAIH KATAOKA, male   DOB: 05-12-1945, 77 y.o.   MRN: [id number]        Chief Complaint: follow up insomnia, daytime somnolence, hyperglycema, low vit d       HPI:  Jason Robles is a 77 y.o. male here with c/o persistent insomnia with inability to get to sleep, but does get through the night with ambien in the past.  Also despite sleeping better at night, still has worsening day time somnolence   Pt denies chest pain, increased sob or doe, wheezing, orthopnea, PND, increased LE swelling, palpitations, dizziness or syncope.   Pt denies polydipsia, polyuria, or new focal neuro s/s.    Pt denies fever, wt loss, night sweats, loss of appetite, or other constitutional symptoms         Wt Readings from Last 3 Encounters:  08/02/22 173 lb (78.5 kg)  06/27/22 176 lb (79.8 kg)  06/07/22 176 lb (79.8 kg)   BP Readings from Last 3 Encounters:  08/02/22 120/82  06/27/22 124/76  05/25/22 126/80         Past Medical History:  Diagnosis Date   Acute left lumbar radiculopathy 04/16/2017   Age-related nuclear cataract, bilateral 06/06/2021   Allergic conjunctivitis 08/16/2015   Allergic rhinitis 08/25/2007   Aortic atherosclerosis 07/27/2020   Arm paresthesia, left 06/27/2022   Benign prostatic hyperplasia 08/21/2006   Bilateral carpal tunnel syndrome 07/26/2019   Blurred vision, left eye 06/07/2021   Chest pain 06/28/2010   Chronic post-traumatic stress disorder (PTSD)    Complication of anesthesia    woken up during surgeries   Crushing injury of right ring finger 04/15/2019   Degenerative joint disease of knee 08/21/2006   Diverticulosis of colon (without mention of hemorrhage) 04/18/2007   External hemorrhoids 04/18/2007   Ganglion cyst of wrist, right 11/11/2015   GERD (gastroesophageal reflux disease)    Hand arthritis 07/26/2019   Headache 09/20/2009   Hearing loss, 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/21/2006   Hyperglycemia 08/05/2019   Hyperlipidemia 08/21/2006   Inflammatory pain  01/26/2022   Insomnia 08/16/2015   Lightheadedness 04/30/2018   Low back pain 04/02/2017   Medial epicondylitis of left elbow 06/25/2012   Mild cognitive impairment of uncertain or unknown etiology 07/18/2022   Nail fungus 06/02/2018   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   Numbness of hand 04/26/2022   Osteoarthritis of glenohumeral joint 06/08/2019   Peripheral vascular disease 08/25/2007   Personal history of colonic polyps 04/18/2007   Poison oak 05/24/2020   Primary osteoarthritis involving multiple joints 04/15/2019   Raynaud's syndrome 08/21/2006   Renal insufficiency 07/31/2018   Right knee pain 06/25/2012   S/P reverse total shoulder arthroplasty, 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   Syncope 05/02/2012   TIA (transient ischemic attack) 06/28/2010   Upper back pain on left side 06/27/2022   Vertigo 04/30/2018   Vitamin B12 deficiency 06/07/2021   Vitamin D deficiency 06/07/2021   Past Surgical History:  Procedure Laterality Date   APPENDECTOMY     COLONOSCOPY  07/25/2006   POLYPECTOMY     REVERSE SHOULDER ARTHROPLASTY Right 12/28/2021   Procedure: REVERSE SHOULDER ARTHROPLASTY;  Surgeon: Yolonda Kida, MD;  Location: [facility];  Service: Orthopedics;  Laterality: Right;  150   s/p knee surgery Bilateral    x's 4 since 1980   s/p left shoulder  2002   rotator cuff   s/p lumbar disc surgery  2007   TOTAL KNEE ARTHROPLASTY  Left 02/06/2016   TOTAL KNEE ARTHROPLASTY Right 03/26/2016   Procedure: RIGHT TOTAL KNEE ARTHROPLASTY;  Surgeon: Dannielle Huh, MD;  Location: [facility] OR;  Service: Orthopedics;  Laterality: Right;   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   Procedure: TOTAL KNEE ARTHROPLASTY;  Surgeon: Dannielle Huh, MD;  Location: [facility] OR;  Service: Orthopedics;  Laterality: Left;   UPPER GASTROINTESTINAL ENDOSCOPY      reports that he quit smoking about 42 years ago. His smoking use included cigarettes. He started smoking about 57 years ago.  He has a 15 pack-year smoking history. He has never been exposed to tobacco smoke. He has never used smokeless tobacco. He reports current alcohol use of about 4.0 standard drinks of alcohol per week. He reports that he does not use drugs. family history includes Alzheimer's disease in his half-sister, maternal aunt, and mother; Diabetes in his maternal uncle and mother; Kidney cancer in his brother. Allergies  Allergen Reactions   Iohexol Itching    Itching post 125cc Omni injection. Treated w/ 50 mg PO benedryl.  Onset Date: [id number]    Current Outpatient Medications on File Prior to Visit  Medication Sig Dispense Refill   Ascorbic Acid (VITAMIN C PO) Take 1 tablet by mouth daily.     aspirin 81 MG EC tablet Take 1 tablet (81 mg total) by mouth daily. Swallow whole. 30 tablet 12   atorvastatin (LIPITOR) 40 MG tablet Take 1 tablet (40 mg total) by mouth daily. 90 tablet 3   Cyanocobalamin (VITAMIN B-12 PO) Take 1 tablet by mouth daily.     meloxicam (MOBIC) 15 MG tablet TAKE 1 TABLET BY MOUTH EVERY DAY 90 tablet 2   sodium chloride (OCEAN) 0.65 % SOLN nasal spray Place 1 spray into both nostrils as needed for congestion (dry sockets).     triamcinolone (NASACORT) 55 MCG/ACT AERO nasal inhaler Place 2 sprays into the nose daily. 1 Inhaler 12   Ubrogepant (UBRELVY) 100 MG TABS 1 tab by mouth once per day as needed 30 tablet 5   HYDROcodone-acetaminophen (NORCO/VICODIN) 5-325 MG tablet Take 1 tablet by mouth every 4 (four) hours as needed for moderate pain. (Patient not taking: Reported on 08/02/2022) 20 tablet 0   ketoconazole (NIZORAL) 2 % cream Apply 1 application. topically daily. (Patient not taking: Reported on 08/02/2022) 30 g 2   methocarbamol (ROBAXIN) 500 MG tablet Take 1 tablet (500 mg total) by mouth 3 (three) times daily. (Patient not taking: Reported on 08/02/2022) 90 tablet 2   nitroGLYCERIN (NITROSTAT) 0.4 MG SL tablet Place 1 tablet (0.4 mg total) under the tongue every 5 (five) minutes  as needed for chest pain. (Patient not taking: Reported on 08/02/2022) 25 tablet 3   ondansetron (ZOFRAN) 4 MG tablet Take 1 tablet (4 mg total) by mouth every 8 (eight) hours as needed for vomiting or nausea. (Patient not taking: Reported on 08/02/2022) 20 tablet 0   pantoprazole (PROTONIX) 40 MG tablet TAKE 1 TABLET BY MOUTH EVERY DAY (Patient not taking: Reported on 08/02/2022) 90 tablet 3   predniSONE (DELTASONE) 10 MG tablet 3 tabs po qd x 2 days, then 2 tabs po qd x 2 days, then 1 tab po qd x 2 days (Patient not taking: Reported on 08/02/2022) 12 tablet 0   traMADol (ULTRAM) 50 MG tablet Take 1 tablet (50 mg total) by mouth every 12 (twelve) hours as needed. (Patient not taking: Reported on 08/02/2022) 60 tablet 3   No current facility-administered medications on file prior to visit.  ROS:  All others reviewed and negative.  Objective        PE:  BP 120/82 (BP Location: Left Arm, Patient Position: Sitting, Cuff Size: Normal)   Pulse 77   Temp 98.2 F (36.8 C) (Oral)   Ht 5\' 11"  (1.803 m)   Wt 173 lb (78.5 kg)   SpO2 99%   BMI 24.13 kg/m                 Constitutional: Pt appears in NAD               HENT: Head: NCAT.                Right Ear: External ear normal.                 Left Ear: External ear normal.                Eyes: . Pupils are equal, round, and reactive to light. Conjunctivae and EOM are normal               Nose: without d/c or deformity               Neck: Neck supple. Gross normal ROM               Cardiovascular: Normal rate and regular rhythm.                 Pulmonary/Chest: Effort normal and breath sounds without rales or wheezing.                Abd:  Soft, NT, ND, + BS, no organomegaly               Neurological: Pt is alert. At baseline orientation, motor grossly intact               Skin: Skin is warm. No rashes, no other new lesions, LE edema - none               Psychiatric: Pt behavior is normal without agitation   Micro: none  Cardiac tracings I  have personally interpreted today:  none  Pertinent Radiological findings (summarize): none   Lab Results  Component Value Date   WBC 6.1 06/27/2022   HGB 13.5 06/27/2022   HCT 40.6 06/27/2022   PLT 191.0 06/27/2022   GLUCOSE 111 (H) 06/27/2022   CHOL 159 06/27/2022   TRIG 149.0 06/27/2022   HDL 54.60 06/27/2022   LDLDIRECT 100.0 07/14/2018   LDLCALC 75 06/27/2022   ALT 20 06/27/2022   AST 20 06/27/2022   NA 140 06/27/2022   K 4.4 06/27/2022   CL 100 06/27/2022   CREATININE 1.06 06/27/2022   BUN 26 (H) 06/27/2022   CO2 37 (H) 06/27/2022   TSH 1.13 06/27/2022   PSA 2.41 06/27/2022   INR 0.89 05/02/2012   HGBA1C 6.0 06/27/2022   MICROALBUR 0.8 06/27/2022   Assessment/Plan:  Jason Robles is a 77 y.o. White or Caucasian [1] male with  has a past medical history of Acute left lumbar radiculopathy (04/16/2017), Age-related nuclear cataract, bilateral (06/06/2021), Allergic conjunctivitis (08/16/2015), Allergic rhinitis (08/25/2007), Aortic atherosclerosis (07/27/2020), Arm paresthesia, left (06/27/2022), Benign prostatic hyperplasia (08/21/2006), Bilateral carpal tunnel syndrome (07/26/2019), Blurred vision, left eye (06/07/2021), Chest pain (06/28/2010), Chronic post-traumatic stress disorder (PTSD), Complication of anesthesia, Crushing injury of right ring finger (04/15/2019), Degenerative joint disease of knee (08/21/2006), Diverticulosis of colon (without mention of hemorrhage) (04/18/2007), External hemorrhoids (04/18/2007), Ganglion  cyst of wrist, right (11/11/2015), GERD (gastroesophageal reflux disease), Hand arthritis (07/26/2019), Headache (09/20/2009), Hearing loss, right ear (08/21/2006), Hyperglycemia (08/05/2019), Hyperlipidemia (08/21/2006), Inflammatory pain (01/26/2022), Insomnia (08/16/2015), Lightheadedness (04/30/2018), Low back pain (04/02/2017), Medial epicondylitis of left elbow (06/25/2012), Mild cognitive impairment of uncertain or unknown etiology  (07/18/2022), Nail fungus (06/02/2018), Nevus of choroid of left eye (06/06/2021), Numbness of hand (04/26/2022), Osteoarthritis of glenohumeral joint (06/08/2019), Peripheral vascular disease (08/25/2007), Personal history of colonic polyps (04/18/2007), Poison oak (05/24/2020), Primary osteoarthritis involving multiple joints (04/15/2019), Raynaud's syndrome (08/21/2006), Renal insufficiency (07/31/2018), Right knee pain (06/25/2012), S/P reverse total shoulder arthroplasty, 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  Insomnia Chronic mild persistent - for ambien 10 at bedtime prn restart  Hyperglycemia Lab Results  Component Value Date   HGBA1C 6.0 06/27/2022   Stable, pt to continue current medical treatment - diet, wt control   Vitamin D deficiency Last vitamin D Lab Results  Component Value Date   VD25OH 25.21 (L) 06/27/2022   Low,to start oral replacement   Hypersomnolence Can't r/o osa- for pulm referral  Followup: Return if symptoms worsen or fail to improve.  Jason Barre, MD 08/05/2022 12:56 PM [facility]

## 2022-08-02 NOTE — Telephone Encounter (Signed)
Spoke to patient and relayed back message for MD

## 2022-08-05 ENCOUNTER — Encounter: Payer: Self-pay | Admitting: Internal Medicine

## 2022-08-05 NOTE — Assessment & Plan Note (Signed)
Chronic mild persistent - for ambien 10 at bedtime prn restart

## 2022-08-05 NOTE — Assessment & Plan Note (Signed)
Last vitamin D Lab Results  Component Value Date   VD25OH 25.21 (L) 06/27/2022   Low, to start oral replacement

## 2022-08-05 NOTE — Assessment & Plan Note (Signed)
Cant r/o osa - for pulm referral

## 2022-08-05 NOTE — Assessment & Plan Note (Signed)
Lab Results  Component Value Date   HGBA1C 6.0 06/27/2022   Stable, pt to continue current medical treatment  - diet, wt control

## 2022-10-01 ENCOUNTER — Telehealth: Payer: Self-pay | Admitting: Internal Medicine

## 2022-10-01 NOTE — Telephone Encounter (Signed)
Patient would like to know if PCP would recommend he get shingles vaccines. Patient would like a call back at [phone].

## 2022-10-01 NOTE — Telephone Encounter (Signed)
Yes that would be great, to be done at any pharmacy, as it would be free there for anyone over 75yo

## 2022-10-02 NOTE — Telephone Encounter (Signed)
Called and let Pt know

## 2022-10-17 ENCOUNTER — Other Ambulatory Visit: Payer: Self-pay | Admitting: Internal Medicine

## 2022-10-17 ENCOUNTER — Other Ambulatory Visit: Payer: Self-pay

## 2022-10-26 ENCOUNTER — Other Ambulatory Visit: Payer: Self-pay | Admitting: Internal Medicine

## 2022-10-26 ENCOUNTER — Other Ambulatory Visit: Payer: Self-pay

## 2023-01-15 ENCOUNTER — Telehealth: Payer: Self-pay

## 2023-01-15 ENCOUNTER — Other Ambulatory Visit (HOSPITAL_COMMUNITY): Payer: Self-pay

## 2023-01-15 NOTE — Telephone Encounter (Signed)
 Pharmacy Patient Advocate Encounter   Received notification from CoverMyMeds that prior authorization for Ubrelvy  100MG  tablets is required/requested.   Insurance verification completed.   The patient is insured through [employer] [facility] Medicare .   Prior Authorization for Ubrelvy  100MG  tablets has been APPROVED from 01-15-2023 to 01-01-2024   PA #/Case ID/Reference #: Jason Robles

## 2023-02-28 ENCOUNTER — Other Ambulatory Visit: Payer: Self-pay | Admitting: Internal Medicine

## 2023-03-04 ENCOUNTER — Institutional Professional Consult (permissible substitution): Payer: Medicare HMO | Admitting: Psychology

## 2023-03-04 ENCOUNTER — Ambulatory Visit: Payer: Self-pay

## 2023-03-11 ENCOUNTER — Encounter: Payer: Medicare HMO | Admitting: Psychology

## 2023-05-01 ENCOUNTER — Telehealth: Payer: Self-pay

## 2023-05-01 MED ORDER — ZOLPIDEM TARTRATE 10 MG PO TABS: 10.0000 mg | ORAL_TABLET | Freq: Every evening | ORAL | 1 refills | Status: DC | PRN

## 2023-05-01 NOTE — Addendum Note (Signed)
 Addended by: Roslyn Coombe on: 05/01/2023 10:52 AM   Modules accepted: Orders

## 2023-05-01 NOTE — Telephone Encounter (Signed)
 Copied from CRM [id number]. Topic: Clinical - Medication Question >> Apr 30, 2023  4:17 PM Trula Gable C wrote: Reason for CRM: Patient called in regarding medication zolpidem  (AMBIEN ) 10 MG tablet stated his wife is also on it she gets a 90 day supply and he only gets 30 day supply and it is cheaper , would like to know if there is a reason he can only get 30 day supply and if he could get 90 .   [phone]

## 2023-05-01 NOTE — Telephone Encounter (Signed)
 Ok [facility]     thanks

## 2023-05-28 ENCOUNTER — Other Ambulatory Visit: Payer: Self-pay | Admitting: Internal Medicine

## 2023-05-28 DIAGNOSIS — R413 Other amnesia: Secondary | ICD-10-CM

## 2023-06-12 ENCOUNTER — Ambulatory Visit: Payer: Medicare HMO

## 2023-07-08 ENCOUNTER — Ambulatory Visit
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
## 2023-07-15 ENCOUNTER — Encounter: Payer: Self-pay | Admitting: Physician Assistant

## 2023-07-15 ENCOUNTER — Ambulatory Visit: Admitting: Physician Assistant

## 2023-07-15 ENCOUNTER — Ambulatory Visit

## 2023-07-15 VITALS — BP 123/78 | HR 60 | Resp 18 | Ht 71.0 in | Wt 168.0 lb

## 2023-07-15 DIAGNOSIS — G3184 Mild cognitive impairment, so stated: Secondary | ICD-10-CM

## 2023-07-15 NOTE — Patient Instructions (Addendum)
 It was a pleasure to see you today at our office.   Recommendations:  Neurocognitive evaluation at our office  bet June and July of next year Follow up in 1 year July 14 at 11:30  Recommend psychotherapy for PTSD        [email or web address]-[email or web address]   RECOMMENDATIONS FOR ALL PATIENTS WITH MEMORY PROBLEMS: 1. Continue to exercise (Recommend 30 minutes of walking everyday, or 3 hours every week) 2. Increase social interactions - continue going to [place] and enjoy social gatherings with friends and family 3. Eat healthy, avoid fried foods and eat more fruits and vegetables 4. Maintain adequate blood pressure, blood sugar, and blood cholesterol level. Reducing the risk of stroke and cardiovascular disease also helps promoting better memory. 5. Avoid stressful situations. Live a simple life and avoid aggravations. Organize your time and prepare for the next day in anticipation. 6. Sleep well, avoid any interruptions of sleep and avoid any distractions in the bedroom that may interfere with adequate sleep quality 7. Avoid sugar, avoid sweets as there is a strong link between excessive sugar intake, diabetes, and cognitive impairment We discussed the Mediterranean diet, which has been shown to help patients reduce the risk of progressive memory disorders and reduces cardiovascular risk. This includes eating fish, eat fruits and green leafy vegetables, nuts like almonds and hazelnuts, walnuts, and also use olive oil. Avoid fast foods and fried foods as much as possible. Avoid sweets and sugar as sugar use has been linked to worsening of memory function.  There is always a concern of gradual progression of memory problems. If this is the case, then we may need to adjust level of care according to patient needs. Support, both to the patient and caregiver, should then be put into place.      You have been referred for a neuropsychological evaluation  (i.e., evaluation of memory and thinking abilities). Please bring someone with you to this appointment if possible, as it is helpful for the doctor to hear from both you and another adult who knows you well. Please bring eyeglasses and hearing aids if you wear them.    The evaluation will take approximately 3 hours and has two parts:   The first part is a clinical interview with the neuropsychologist (Dr. Richie or Dr. Gayland). During the interview, the neuropsychologist will speak with you and the individual you brought to the appointment.    The second part of the evaluation is testing with the doctor's technician Neal or Luke). During the testing, the technician will ask you to remember different types of material, solve problems, and answer some questionnaires. Your family member will not be present for this portion of the evaluation.   Please note: We must reserve several hours of the neuropsychologist's time and the psychometrician's time for your evaluation appointment. As such, there is a No-Show fee of $100. If you are unable to attend any of your appointments, please contact our office as soon as possible to reschedule.      DRIVING: Regarding driving, in patients with progressive memory problems, driving will be impaired. We advise to have someone else do the driving if trouble finding directions or if minor accidents are reported. Independent driving assessment is available to determine safety of driving.   If you are interested in the driving assessment, you can contact the following:  [facility] in [place] [phone]  [facility] [phone]  [facility] [phone]  [facility] [phone] or [phone]   FALL  PRECAUTIONS: Be cautious when walking. Scan the area for obstacles that may increase the risk of trips and falls. When getting up in the mornings, sit up at the edge of the bed for a few minutes before getting out  of bed. Consider elevating the bed at the head end to avoid drop of blood pressure when getting up. Walk always in a well-lit room (use night lights in the walls). Avoid area rugs or power cords from appliances in the middle of the walkways. Use a walker or a cane if necessary and consider physical therapy for balance exercise. Get your eyesight checked regularly.  FINANCIAL OVERSIGHT: Supervision, especially oversight when making financial decisions or transactions is also recommended.  HOME SAFETY: Consider the safety of the kitchen when operating appliances like stoves, microwave oven, and blender. Consider having supervision and share cooking responsibilities until no longer able to participate in those. Accidents with firearms and other hazards in the house should be identified and addressed as well.   ABILITY TO BE LEFT ALONE: If patient is unable to contact 911 operator, consider using LifeLine, or when the need is there, arrange for someone to stay with patients. Smoking is a fire hazard, consider supervision or cessation. Risk of wandering should be assessed by caregiver and if detected at any point, supervision and safe proof recommendations should be instituted.  MEDICATION SUPERVISION: Inability to self-administer medication needs to be constantly addressed. Implement a mechanism to ensure safe administration of the medications.      Mediterranean Diet A Mediterranean diet refers to food and lifestyle choices that are based on the traditions of countries located on the [employer] [place]. This way of eating has been shown to help prevent certain conditions and improve outcomes for people who have chronic diseases, like kidney disease and heart disease. What are tips for following this plan? Lifestyle  Cook and eat meals together with your family, when possible. Drink enough fluid to keep your urine clear or pale yellow. Be physically active every day. This includes: Aerobic exercise  like running or swimming. Leisure activities like gardening, walking, or housework. Get 7-8 hours of sleep each night. If recommended by your health care provider, drink red wine in moderation. This means 1 glass a day for nonpregnant women and 2 glasses a day for men. A glass of wine equals 5 oz (150 mL). Reading food labels  Check the serving size of packaged foods. For foods such as rice and pasta, the serving size refers to the amount of cooked product, not dry. Check the total fat in packaged foods. Avoid foods that have saturated fat or trans fats. Check the ingredients list for added sugars, such as corn syrup. Shopping  At the grocery store, buy most of your food from the areas near the walls of the store. This includes: Fresh fruits and vegetables (produce). Grains, beans, nuts, and seeds. Some of these may be available in unpackaged forms or large amounts (in bulk). Fresh seafood. Poultry and eggs. Low-fat dairy products. Buy whole ingredients instead of prepackaged foods. Buy fresh fruits and vegetables in-season from local farmers markets. Buy frozen fruits and vegetables in resealable bags. If you do not have access to quality fresh seafood, buy precooked frozen shrimp or canned fish, such as tuna, salmon, or sardines. Buy small amounts of raw or cooked vegetables, salads, or olives from the deli or salad bar at your store. Stock your pantry so you always have certain foods on hand, such as olive oil, canned  tuna, canned tomatoes, rice, pasta, and beans. Cooking  Cook foods with extra-virgin olive oil instead of using butter or other vegetable oils. Have meat as a side dish, and have vegetables or grains as your main dish. This means having meat in small portions or adding small amounts of meat to foods like pasta or stew. Use beans or vegetables instead of meat in common dishes like chili or lasagna. Experiment with different cooking methods. Try roasting or broiling vegetables  instead of steaming or sauteing them. Add frozen vegetables to soups, stews, pasta, or rice. Add nuts or seeds for added healthy fat at each meal. You can add these to yogurt, salads, or vegetable dishes. Marinate fish or vegetables using olive oil, lemon juice, garlic, and fresh herbs. Meal planning  Plan to eat 1 vegetarian meal one day each week. Try to work up to 2 vegetarian meals, if possible. Eat seafood 2 or more times a week. Have healthy snacks readily available, such as: Vegetable sticks with hummus. Greek yogurt. Fruit and nut trail mix. Eat balanced meals throughout the week. This includes: Fruit: 2-3 servings a day Vegetables: 4-5 servings a day Low-fat dairy: 2 servings a day Fish, poultry, or lean meat: 1 serving a day Beans and legumes: 2 or more servings a week Nuts and seeds: 1-2 servings a day Whole grains: 6-8 servings a day Extra-virgin olive oil: 3-4 servings a day Limit red meat and sweets to only a few servings a month What are my food choices? Mediterranean diet Recommended Grains: Whole-grain pasta. Brown rice. Bulgar wheat. Polenta. Couscous. Whole-wheat bread. [place]. Vegetables: Artichokes. Beets. Broccoli. Cabbage. Carrots. Eggplant. Green beans. Chard. Kale. Spinach. Onions. Leeks. Peas. Squash. Tomatoes. Peppers. Radishes. Fruits: Apples. Apricots. Avocado. Berries. Bananas. Cherries. Dates. Figs. Grapes. Lemons. Melon. Oranges. Peaches. Plums. Pomegranate. Meats and other protein foods: Beans. Almonds. Sunflower seeds. Pine nuts. Peanuts. Cod. Salmon. Scallops. Shrimp. Tuna. Tilapia. Clams. Oysters. Eggs. Dairy: Low-fat milk. Cheese. Greek yogurt. Beverages: Water . Red wine. Herbal tea. Fats and oils: Extra virgin olive oil. Avocado oil. Grape seed oil. Sweets and desserts: [place] yogurt with honey. Baked apples. Poached pears. Trail mix. Seasoning and other foods: Basil. Cilantro. Coriander. Cumin. Mint. Parsley. Sage. Rosemary. Tarragon.  Garlic. Oregano. Thyme. Pepper. Balsalmic vinegar. Tahini. Hummus. Tomato sauce. Olives. Mushrooms. Limit these Grains: Prepackaged pasta or rice dishes. Prepackaged cereal with added sugar. Vegetables: Deep fried potatoes (french fries). Fruits: Fruit canned in syrup. Meats and other protein foods: Beef. Pork. Lamb. Poultry with skin. Hot dogs. [place]. Dairy: Ice cream. Sour cream. Whole milk. Beverages: Juice. Sugar-sweetened soft drinks. Beer. Liquor and spirits. Fats and oils: Butter. Canola oil. Vegetable oil. Beef fat (tallow). Lard. Sweets and desserts: Cookies. Cakes. Pies. Candy. Seasoning and other foods: Mayonnaise. Premade sauces and marinades. The items listed may not be a complete list. Talk with your dietitian about what dietary choices are right for you. Summary The Mediterranean diet includes both food and lifestyle choices. Eat a variety of fresh fruits and vegetables, beans, nuts, seeds, and whole grains. Limit the amount of red meat and sweets that you eat. Talk with your health care provider about whether it is safe for you to drink red wine in moderation. This means 1 glass a day for nonpregnant women and 2 glasses a day for men. A glass of wine equals 5 oz (150 mL). This information is not intended to replace advice given to you by your health care provider. Make sure you discuss any questions you have  with your health care provider. Document Released: 08/11/2015 Document Revised: 09/13/2015 Document Reviewed: 08/11/2015 Elsevier Interactive Patient Education  2017 [employer].

## 2023-07-30 DIAGNOSIS — L821 Other seborrheic keratosis: Secondary | ICD-10-CM | POA: Diagnosis not present

## 2023-07-30 DIAGNOSIS — D225 Melanocytic nevi of trunk: Secondary | ICD-10-CM | POA: Diagnosis not present

## 2023-07-30 DIAGNOSIS — L820 Inflamed seborrheic keratosis: Secondary | ICD-10-CM | POA: Diagnosis not present

## 2023-09-10 ENCOUNTER — Ambulatory Visit: Admitting: Physician Assistant

## 2023-10-14 ENCOUNTER — Other Ambulatory Visit: Payer: Self-pay | Admitting: Internal Medicine

## 2023-11-05 ENCOUNTER — Other Ambulatory Visit: Payer: Self-pay | Admitting: Internal Medicine

## 2023-11-05 MED ORDER — ZOLPIDEM TARTRATE 10 MG PO TABS: 10.0000 mg | ORAL_TABLET | Freq: Every evening | ORAL | 0 refills | Status: AC | PRN

## 2023-11-22 ENCOUNTER — Other Ambulatory Visit: Payer: Self-pay | Admitting: Internal Medicine

## 2023-12-05 ENCOUNTER — Telehealth: Payer: Self-pay

## 2023-12-05 NOTE — Telephone Encounter (Signed)
 Copied from CRM #[id number]. Topic: Clinical - Medical Advice >> Dec 05, 2023  2:43 PM Anairis L wrote: Reason for CRM: Patient would like to know if he has ever been tested for parkinson's  disease if not can he be tested.   Please give patient a call.

## 2023-12-10 ENCOUNTER — Other Ambulatory Visit: Payer: Self-pay | Admitting: Internal Medicine

## 2023-12-10 ENCOUNTER — Other Ambulatory Visit: Payer: Self-pay

## 2023-12-11 ENCOUNTER — Ambulatory Visit: Payer: Self-pay | Admitting: *Deleted

## 2023-12-11 NOTE — Telephone Encounter (Signed)
 FYI Only or Action Required?: FYI only for provider: UC advised.  Patient was last seen in primary care on 08/02/2022 by Norleen Lynwood ORN, MD.  Called Nurse Triage reporting Cough.  Symptoms began a week ago.  Interventions attempted: Rest, hydration, or home remedies.  Symptoms are: gradually worsening.  Triage Disposition: See HCP Within 4 Hours (Or PCP Triage)  Patient/caregiver understands and will follow disposition?: yes  Copied from CRM #[id number]. Topic: Clinical - Red Word Triage >> Dec 11, 2023  8:46 AM Carlyon D wrote: Red Word that prompted transfer to Nurse Triage: Pt wife miss judy calling in regards to pt  having a fever, sore throat, sinus headache, Severe cough. Reason for Disposition  [1] MILD difficulty breathing (e.g., minimal/no SOB at rest, SOB with walking, pulse < 100) AND [2] still present when not coughing  Answer Assessment - Initial Assessment Questions 1. ONSET: When did the cough begin?      Friday/Sat 2. SEVERITY: How bad is the cough today?      Coughing a lot and at night 3. SPUTUM: Describe the color of your sputum (e.g., none, dry cough; clear, white, yellow, green)     yellow 4. HEMOPTYSIS: Are you coughing up any blood? If Yes, ask: How much? (e.g., flecks, streaks, tablespoons, etc.)     no 5. DIFFICULTY BREATHING: Are you having difficulty breathing? If Yes, ask: How bad is it? (e.g., mild, moderate, severe)      Slightly shallow due to congestion 6. FEVER: Do you have a fever? If Yes, ask: What is your temperature, how was it measured, and when did it start?     Low grade 99-100- Sunday, no temp today 7. CARDIAC HISTORY: Do you have any history of heart disease? (e.g., heart attack, congestive heart failure)      TIA 8. LUNG HISTORY: Do you have any history of lung disease?  (e.g., pulmonary embolus, asthma, emphysema)     no 9. PE RISK FACTORS: Do you have a history of blood clots? (or: recent major surgery, recent  prolonged travel, bedridden)     no 10. OTHER SYMPTOMS: Do you have any other symptoms? (e.g., runny nose, wheezing, chest pain)       sneezing  12. TRAVEL: Have you traveled out of the country in the last month? (e.g., travel history, exposures)       no  Protocols used: Cough - Acute Productive-A-AH

## 2023-12-19 ENCOUNTER — Ambulatory Visit

## 2023-12-19 ENCOUNTER — Encounter: Payer: Self-pay | Admitting: Nurse Practitioner

## 2023-12-19 ENCOUNTER — Ambulatory Visit: Payer: Self-pay | Admitting: Nurse Practitioner

## 2023-12-19 ENCOUNTER — Ambulatory Visit: Admitting: Nurse Practitioner

## 2023-12-19 VITALS — BP 112/72 | HR 95 | Temp 97.8°F | Ht 71.0 in | Wt 170.0 lb

## 2023-12-19 DIAGNOSIS — E782 Mixed hyperlipidemia: Secondary | ICD-10-CM | POA: Diagnosis not present

## 2023-12-19 DIAGNOSIS — R7303 Prediabetes: Secondary | ICD-10-CM | POA: Diagnosis not present

## 2023-12-19 DIAGNOSIS — N400 Enlarged prostate without lower urinary tract symptoms: Secondary | ICD-10-CM

## 2023-12-19 DIAGNOSIS — Z96611 Presence of right artificial shoulder joint: Secondary | ICD-10-CM | POA: Diagnosis not present

## 2023-12-19 DIAGNOSIS — E559 Vitamin D deficiency, unspecified: Secondary | ICD-10-CM

## 2023-12-19 DIAGNOSIS — I7300 Raynaud's syndrome without gangrene: Secondary | ICD-10-CM

## 2023-12-19 DIAGNOSIS — K219 Gastro-esophageal reflux disease without esophagitis: Secondary | ICD-10-CM | POA: Diagnosis not present

## 2023-12-19 DIAGNOSIS — R051 Acute cough: Secondary | ICD-10-CM | POA: Diagnosis not present

## 2023-12-19 DIAGNOSIS — G459 Transient cerebral ischemic attack, unspecified: Secondary | ICD-10-CM

## 2023-12-19 DIAGNOSIS — E538 Deficiency of other specified B group vitamins: Secondary | ICD-10-CM | POA: Diagnosis not present

## 2023-12-19 DIAGNOSIS — M150 Primary generalized (osteo)arthritis: Secondary | ICD-10-CM | POA: Diagnosis not present

## 2023-12-19 DIAGNOSIS — R918 Other nonspecific abnormal finding of lung field: Secondary | ICD-10-CM | POA: Diagnosis not present

## 2023-12-19 DIAGNOSIS — R059 Cough, unspecified: Secondary | ICD-10-CM | POA: Diagnosis not present

## 2023-12-19 MED ORDER — CELECOXIB 200 MG PO CAPS: 200.0000 mg | ORAL_CAPSULE | Freq: Every day | ORAL | Status: AC

## 2023-12-19 MED ORDER — TAMSULOSIN HCL 0.4 MG PO CAPS: 0.4000 mg | ORAL_CAPSULE | Freq: Every day | ORAL | 0 refills | Status: DC

## 2023-12-19 MED ORDER — AZITHROMYCIN 250 MG PO TABS: ORAL_TABLET | ORAL | 0 refills | Status: AC

## 2023-12-19 MED ORDER — ASPIRIN 81 MG PO TBEC: 81.0000 mg | DELAYED_RELEASE_TABLET | Freq: Every day | ORAL | Status: DC

## 2023-12-19 NOTE — Progress Notes (Unsigned)
 New Patient Visit  BP 112/72 (BP Location: Left Arm, Patient Position: Sitting, Cuff Size: Normal)   Pulse 95   Temp 97.8 F (36.6 C)   Ht 5' 11 (1.803 m)   Wt 170 lb (77.1 kg)   BMI 23.71 kg/m    Subjective:    Patient ID: Jason Robles, male    DOB: 08-21-1945, 78 y.o.   MRN: [id number]  CC: Chief Complaint  Patient presents with   Transfer of Care    Est. Care, no concerns    HPI: Jason Robles is a 78 y.o. male presents to transfer care to a new provider.  Introduced to publishing rights manager role and practice setting.  All questions answered.  Discussed provider/patient relationship and expectations.   Past Medical History:  Diagnosis Date   Acute left lumbar radiculopathy 04/16/2017   Age-related nuclear cataract, bilateral 06/06/2021   Allergic conjunctivitis 08/16/2015   Allergic rhinitis 08/25/2007   Aortic atherosclerosis 07/27/2020   Arm paresthesia, left 06/27/2022   Benign prostatic hyperplasia 08/21/2006   Bilateral carpal tunnel syndrome 07/26/2019   Blurred vision, left eye 06/07/2021   Chest pain 06/28/2010   Chronic post-traumatic stress disorder (PTSD)    Complication of anesthesia    woken up during surgeries   Crushing injury of right ring finger 04/15/2019   Degenerative joint disease of knee 08/21/2006   Diverticulosis of colon (without mention of hemorrhage) 04/18/2007   External hemorrhoids 04/18/2007   Ganglion cyst of wrist, right 11/11/2015   GERD (gastroesophageal reflux disease)    Hand arthritis 07/26/2019   Headache 09/20/2009   Hearing loss, 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/21/2006   Hyperglycemia 08/05/2019   Hyperlipidemia 08/21/2006   Inflammatory pain 01/26/2022   Insomnia 08/16/2015   Lightheadedness 04/30/2018   Low back pain 04/02/2017   Medial epicondylitis of left elbow 06/25/2012   Mild cognitive impairment of uncertain or unknown etiology 07/18/2022   Nail fungus 06/02/2018   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    Numbness of hand 04/26/2022   Osteoarthritis of glenohumeral joint 06/08/2019   Peripheral vascular disease 08/25/2007   Personal history of colonic polyps 04/18/2007   Poison oak 05/24/2020   Primary osteoarthritis involving multiple joints 04/15/2019   Raynaud's syndrome 08/21/2006   Renal insufficiency 07/31/2018   Right knee pain 06/25/2012   S/P reverse total shoulder arthroplasty, 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   Syncope 05/02/2012   TIA (transient ischemic attack) 06/28/2010   Upper back pain on left side 06/27/2022   Vertigo 04/30/2018   Vitamin B12 deficiency 06/07/2021   Vitamin D  deficiency 06/07/2021    Past Surgical History:  Procedure Laterality Date   APPENDECTOMY     COLONOSCOPY  07/25/2006   POLYPECTOMY     REVERSE SHOULDER ARTHROPLASTY Right 12/28/2021   Procedure: REVERSE SHOULDER ARTHROPLASTY;  Surgeon: Sharl Selinda Dover, MD;  Location: [facility];  Service: Orthopedics;  Laterality: Right;  150   s/p knee surgery Bilateral    x's 4 since 1980   s/p left shoulder  2002   rotator cuff   s/p lumbar disc surgery  2007   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   TOTAL KNEE ARTHROPLASTY Right 03/26/2016   Procedure: RIGHT TOTAL KNEE ARTHROPLASTY;  Surgeon: Marcey Raman, MD;  Location: [facility] OR;  Service: Orthopedics;  Laterality: Right;   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   Procedure: TOTAL KNEE ARTHROPLASTY;  Surgeon: Raman Marcey, MD;  Location: [facility] OR;  Service: Orthopedics;  Laterality: Left;   UPPER GASTROINTESTINAL ENDOSCOPY      Family History  Problem Relation Age of Onset   Diabetes Mother    Alzheimer's disease Mother    Alzheimer's disease Sister    Kidney cancer Brother    Alzheimer's disease Maternal Aunt    Diabetes Maternal Uncle    Alzheimer's disease Half-Sister    Colon cancer Neg Hx    Colon polyps Neg Hx    Esophageal cancer Neg Hx    Rectal cancer Neg Hx    Stomach cancer Neg Hx      Social  History[1]  Medications Ordered Prior to Encounter[2]   Review of Systems  Constitutional:  Positive for fatigue. Negative for fever.  HENT:  Positive for congestion. Negative for sore throat.   Respiratory:  Positive for cough. Negative for shortness of breath.   Cardiovascular: Negative.   Genitourinary:  Positive for frequency. Negative for dysuria and hematuria.  Musculoskeletal:  Positive for arthralgias.  Skin: Negative.   Neurological:  Positive for dizziness.  Psychiatric/Behavioral: Negative.        Objective:    BP 112/72 (BP Location: Left Arm, Patient Position: Sitting, Cuff Size: Normal)   Pulse 95   Temp 97.8 F (36.6 C)   Ht 5' 11 (1.803 m)   Wt 170 lb (77.1 kg)   BMI 23.71 kg/m   Wt Readings from Last 3 Encounters:  12/19/23 170 lb (77.1 kg)  07/15/23 168 lb (76.2 kg)  08/02/22 173 lb (78.5 kg)    BP Readings from Last 3 Encounters:  12/19/23 112/72  07/15/23 123/78  08/02/22 120/82    Physical Exam     Assessment & Plan:   Problem List Items Addressed This Visit       Cardiovascular and Mediastinum   TIA (transient ischemic attack) - Primary   Relevant Medications   aspirin  EC 81 MG tablet     Digestive   GERD (gastroesophageal reflux disease)     Musculoskeletal and Integument   Primary osteoarthritis involving multiple joints   Relevant Medications   aspirin  EC 81 MG tablet   celecoxib  (CELEBREX ) 200 MG capsule     Genitourinary   Benign prostatic hyperplasia     Other   Hyperlipidemia   Relevant Medications   aspirin  EC 81 MG tablet   Vitamin D  deficiency   Vitamin B12 deficiency   Prediabetes     Follow up plan: No follow-ups on file.  Luara Faye A Chrisanne Loose    [1]  Social History Tobacco Use   Smoking status: Former    Current packs/day: 0.00    Average packs/day: 1 pack/day for 15.0 years (15.0 ttl pk-yrs)    Types: Cigarettes    Start date: 01/30/1965    Quit date: 01/31/1980    Years since quitting: 43.9     Passive exposure: Never   Smokeless tobacco: Never  Vaping Use   Vaping status: Never Used  Substance Use Topics   Alcohol  use: Not Currently    Alcohol /week: 2.0 standard drinks of alcohol     Types: 2 Shots of liquor per week   Drug use: No  [2]  Current Outpatient Medications on File Prior to Visit  Medication Sig Dispense Refill   Ascorbic Acid (VITAMIN C PO) Take 1 tablet by mouth daily.     atorvastatin  (LIPITOR) 40 MG tablet TAKE 1 TABLET BY MOUTH EVERY DAY 90 tablet 1   methocarbamol  (ROBAXIN ) 500 MG tablet Take 1 tablet (500 mg total) by  mouth 3 (three) times daily. (Patient taking differently: Take 500 mg by mouth as needed.) 90 tablet 2   ondansetron  (ZOFRAN ) 4 MG tablet Take 1 tablet (4 mg total) by mouth every 8 (eight) hours as needed for vomiting or nausea. 20 tablet 0   pantoprazole  (PROTONIX ) 40 MG tablet TAKE 1 TABLET BY MOUTH EVERY DAY (Patient not taking: Reported on 12/19/2023) 90 tablet 3   sodium chloride  (OCEAN) 0.65 % SOLN nasal spray Place 1 spray into both nostrils as needed for congestion (dry sockets).     triamcinolone  (NASACORT ) 55 MCG/ACT AERO nasal inhaler Place 2 sprays into the nose daily. 1 Inhaler 12   zolpidem  (AMBIEN ) 10 MG tablet Take 1 tablet (10 mg total) by mouth at bedtime as needed. for sleep G47.00 90 tablet 0   nitroGLYCERIN  (NITROSTAT ) 0.4 MG SL tablet Place 1 tablet (0.4 mg total) under the tongue every 5 (five) minutes as needed for chest pain. (Patient not taking: Reported on 12/19/2023) 25 tablet 3   No current facility-administered medications on file prior to visit.

## 2023-12-19 NOTE — Patient Instructions (Signed)
 It was great to see you!  Let's get an xray of your chest  Start flomax  1 capsule in the evening after dinner for your urinary frequency at night   Let's follow-up in 4-6 week, sooner if you have concerns.  If a referral was placed today, you will be contacted for an appointment. Please note that routine referrals can sometimes take up to 3-4 weeks to process. Please call our office if you haven't heard anything after this time frame.  Take care,  Tinnie Harada, NP

## 2023-12-20 NOTE — Assessment & Plan Note (Signed)
 Chronic, stable. He wears gloves in winter and avoids cold temperatures as able.

## 2023-12-20 NOTE — Assessment & Plan Note (Signed)
 Recommend he start a vitamin B12 supplement 500mcg daily.

## 2023-12-20 NOTE — Assessment & Plan Note (Signed)
 He is taking vitamin D  supplements. Continue vitamin D  supplementation.

## 2023-12-20 NOTE — Assessment & Plan Note (Signed)
 He has slightly elevated blood sugars but no diabetes diagnosis. His diet has improved. Continue current dietary management. Recent labs reviewed.

## 2023-12-20 NOTE — Assessment & Plan Note (Signed)
 His condition is managed with atorvastatin  40mg  daily. Continue atorvastatin . Recent lipid panel reviewed.

## 2023-12-20 NOTE — Assessment & Plan Note (Signed)
Chronic, stable. Continue protonix 40mg  daily.

## 2023-12-20 NOTE — Assessment & Plan Note (Signed)
 History of TIA. Continue atorvastatin  20mg  daily and aspirin  81mg  daily.

## 2023-12-20 NOTE — Assessment & Plan Note (Signed)
 He experiences nocturia and dribbling and is interested in medication to improve symptoms. Prescribe Flomax  (tamsulosin ) 1 capsule in the evening after dinner. Recent PSA reviewed.

## 2023-12-20 NOTE — Assessment & Plan Note (Signed)
 He has undergone multiple joint replacements and is scheduled for left shoulder replacement in two and a half weeks. Pain is managed with Celebrex . Continue Celebrex  200mg  daily for pain management.

## 2024-01-03 ENCOUNTER — Ambulatory Visit (INDEPENDENT_AMBULATORY_CARE_PROVIDER_SITE_OTHER): Admitting: Family Medicine

## 2024-01-03 ENCOUNTER — Encounter: Payer: Self-pay | Admitting: Family Medicine

## 2024-01-03 ENCOUNTER — Ambulatory Visit: Payer: Self-pay

## 2024-01-03 VITALS — BP 116/70 | HR 72 | Temp 97.9°F | Ht 71.0 in | Wt 172.4 lb

## 2024-01-03 DIAGNOSIS — R351 Nocturia: Secondary | ICD-10-CM

## 2024-01-03 DIAGNOSIS — R42 Dizziness and giddiness: Secondary | ICD-10-CM

## 2024-01-03 DIAGNOSIS — R7303 Prediabetes: Secondary | ICD-10-CM

## 2024-01-03 DIAGNOSIS — Z8701 Personal history of pneumonia (recurrent): Secondary | ICD-10-CM

## 2024-01-03 DIAGNOSIS — I7300 Raynaud's syndrome without gangrene: Secondary | ICD-10-CM

## 2024-01-03 NOTE — Telephone Encounter (Signed)
 Appointment today @3 :40 pm. Dm/cma

## 2024-01-03 NOTE — Telephone Encounter (Signed)
 FYI Only or Action Required?: FYI only for provider: appointment scheduled on 01/06/2024.  Patient was last seen in primary care on 12/19/2023 by Nedra Tinnie LABOR, NP.  Called Nurse Triage reporting Dizziness.  Symptoms began several weeks ago.  Interventions attempted: Rest, hydration, or home remedies.  Symptoms are: gradually worsening.  Triage Disposition: see PCP in 24 hours  Patient/caregiver understands and will follow disposition?: Yes   Copied from CRM #[id number]. Topic: Clinical - Red Word Triage >> Jan 03, 2024  8:35 AM Victoria A wrote: [facility] that prompted transfer to Nurse Triage: Patient's  wife called patient is having dizziness, and congestion -patient was dx with Pneumonia on 12/19/23 Reason for Disposition  [1] MODERATE dizziness (e.g., interferes with normal activities) AND [2] has NOT been evaluated by doctor (or NP/PA) for this  (Exception: Dizziness caused by heat exposure, sudden standing, or poor fluid intake.)  Answer Assessment - Initial Assessment Questions Notified CAL, Jon, who will contact provider for possible appt. Today and contact patient. Scheduled and put on wait list as well   1. Describe dizziness Feels like head is fogging up on him 2. LIGHTHEADED: Do you feel lightheaded? (e.g., somewhat faint, woozy, weak upon standing)     Feels light headed or like he will pass out 3. VERTIGO: Do you feel like either you or the room is spinning or tilting? (i.e., vertigo)     denies 4. SEVERITY: How bad is it?  Do you feel like you are going to faint? Can you stand and walk?     Does feel like he will pass out at time and needs to kneel down 5. ONSET:  When did the dizziness begin?     Since the beginning of the pneumonia, but worsening over the past couple of days 6. AGGRAVATING FACTORS: Does anything make it worse? (e.g., standing, change in head position)     Bending over or after walking up the stairs 7. HEART RATE: Can you tell me  your heart rate? How many beats in 15 seconds?  (Note: Not all patients can do this.)       Thinks that  heart is beating faster when this occurs 8. CAUSE: What do you think is causing the dizziness? (e.g., decreased fluids or food, diarrhea, emotional distress, heat exposure, new medicine, sudden standing, vomiting; unknown)     Diagnosed with pneumonia on 12/18 9. RECURRENT SYMPTOM: Have you had dizziness before? If Yes, ask: When was the last time? What happened that time?     denies 10. OTHER SYMPTOMS: Do you have any other symptoms? (e.g., fever, chest pain, vomiting, diarrhea, bleeding)       denies  Protocols used: Dizziness - Lightheadedness-A-AH

## 2024-01-04 LAB — CBC WITH DIFFERENTIAL/PLATELET
Absolute Lymphocytes: 1903 {cells}/uL (ref 850–3900)
Absolute Monocytes: 374 {cells}/uL (ref 200–950)
Basophils Absolute: 28 {cells}/uL (ref 0–200)
Basophils Relative: 0.5 %
Eosinophils Absolute: 149 {cells}/uL (ref 15–500)
Eosinophils Relative: 2.7 %
HCT: 40.6 % (ref 39.4–51.1)
Hemoglobin: 13.4 g/dL (ref 13.2–17.1)
MCH: 28.7 pg (ref 27.0–33.0)
MCHC: 33 g/dL (ref 31.6–35.4)
MCV: 86.9 fL (ref 81.4–101.7)
MPV: 9.5 fL (ref 7.5–12.5)
Monocytes Relative: 6.8 %
Neutro Abs: 3047 {cells}/uL (ref 1500–7800)
Neutrophils Relative %: 55.4 %
Platelets: 183 Thousand/uL (ref 140–400)
RBC: 4.67 Million/uL (ref 4.20–5.80)
RDW: 13.1 % (ref 11.0–15.0)
Total Lymphocyte: 34.6 %
WBC: 5.5 Thousand/uL (ref 3.8–10.8)

## 2024-01-04 LAB — COMPREHENSIVE METABOLIC PANEL WITH GFR
AG Ratio: 1.6 (calc) (ref 1.0–2.5)
ALT: 21 U/L (ref 9–46)
AST: 21 U/L (ref 10–35)
Albumin: 4.3 g/dL (ref 3.6–5.1)
Alkaline phosphatase (APISO): 78 U/L (ref 35–144)
BUN/Creatinine Ratio: 25 (calc) — ABNORMAL HIGH (ref 6–22)
BUN: 26 mg/dL — ABNORMAL HIGH (ref 7–25)
CO2: 29 mmol/L (ref 20–32)
Calcium: 10.3 mg/dL (ref 8.6–10.3)
Chloride: 100 mmol/L (ref 98–110)
Creat: 1.04 mg/dL (ref 0.70–1.28)
Globulin: 2.7 g/dL (ref 1.9–3.7)
Glucose, Bld: 98 mg/dL (ref 65–99)
Potassium: 4.5 mmol/L (ref 3.5–5.3)
Sodium: 137 mmol/L (ref 135–146)
Total Bilirubin: 0.4 mg/dL (ref 0.2–1.2)
Total Protein: 7 g/dL (ref 6.1–8.1)
eGFR: 74 mL/min/1.73m2

## 2024-01-04 LAB — TSH: TSH: 2.21 m[IU]/L (ref 0.40–4.50)

## 2024-01-04 LAB — VITAMIN B12: Vitamin B-12: 335 pg/mL (ref 200–1100)

## 2024-01-04 LAB — T4, FREE: Free T4: 1.2 ng/dL (ref 0.8–1.8)

## 2024-01-04 LAB — MAGNESIUM: Magnesium: 1.8 mg/dL (ref 1.5–2.5)

## 2024-01-04 NOTE — Progress Notes (Signed)
 "    Diagnoses and Orders:   1. Dizziness   2. Nocturia   3. History of recent pneumonia   4. Prediabetes   5. Raynaud's disease without gangrene    Orders Placed This Encounter  Procedures   CBC with Differential/Platelet   Comprehensive metabolic panel with GFR   Magnesium   T4, free   TSH   Vitamin B12   Assessment & Plan:   Assessment and Plan Assessment & Plan Dizziness likely secondary to tamsulosin  (Flomax ) Dizziness with bending, or standing, moderate, no syncope. Symptoms began post-tamsulosin , known for orthostatic hypotension. Note: Oxygen desaturation to 93% noted when standing.  - Discontinued tamsulosin  to assess dizziness resolution. - Ordered labs today to check for other organic reasons for dizziness. - Advised rest and avoidance of strenuous activities until next Monday (he wanted to work in his garden over the weekend). - See AVS.  Nocturia and urinary hesitancy Experiencing nocturia and urinary hesitancy. No urinary retention. - Discontinued tamsulosin . - Discuss alternative medications with PCP or consider urologist referral.  Recent pneumonia Diagnosed with pneumonia, treated with azithromycin . Symptoms improved, reduced coughing, no wheezing. Oxygen desaturation minimal while standing without symptoms.  Raynaud's phenomenon Hands turn pale and cold in response to cold. No new symptoms.  Osteoarthritis of hands Pain and occasional numbness in hands, likely due to osteoarthritis, possibly related to previous occupational activities.  Prediabetes Previous labs indicated prediabetes. No recent labs available. - Ordered labs to assess current blood sugar levels.  Hypertriglyceridemia Previous labs indicated hypertriglyceridemia. No recent labs available. - Ordered labs to assess current triglyceride levels.  Geni Shutter, DO, MS, FAAFP, Dipl. [facility] [address] [place], [address] Dept:  [phone] Dept Fax: [phone]  Subjective:   History of Present Illness Jason Robles is a 79 year old male who presents with dizziness and recent pneumonia.  Dizziness - Moderate dizziness with exertion, bending over, and standing up - Symptoms improve with rest - Onset after starting nightly tamsulosin   Urinary symptoms - Nocturia, waking multiple times per night to urinate  Respiratory symptoms - Treated for pneumonia about two weeks ago with azithromycin  - No current cough or wheeze - Shortness of breath when climbing stairs  Raynaud's phenomenon and hand pain - Raynaud's affecting hands, with pallor, coldness, and numbness - Hand pain attributed to arthritis  Musculoskeletal symptoms - History of right knee and right shoulder replacements - Pain and functional issues in the left shoulder  Review of Systems: Negative, with the exception of above mentioned in HPI.  History:   Reviewed by clinician on day of visit: allergies, medications, problem list, medical history, surgical history, family history, social history, and previous encounter notes.  Medications:   Show/hide medication list[1] Allergies[2]  Objective:   BP 116/70 (BP Location: Right Arm, Patient Position: Sitting, Cuff Size: Large)   Pulse 72   Temp 97.9 F (36.6 C) (Oral)   Ht 5' 11 (1.803 m)   Wt 172 lb 6.4 oz (78.2 kg)   SpO2 99%   BMI 24.04 kg/m    Physical Exam Constitutional:      General: He is not in acute distress.    Appearance: He is well-developed.  HENT:     Head: Normocephalic and atraumatic.     Right Ear: Tympanic membrane and ear canal normal.     Left Ear: Tympanic membrane and ear canal normal.     Nose: Nose normal. No congestion or rhinorrhea.  Mouth/Throat:     Mouth: Mucous membranes are moist.  Eyes:     Extraocular Movements: Extraocular movements intact.     Conjunctiva/sclera: Conjunctivae normal.  Cardiovascular:     Rate and Rhythm: Normal  rate and regular rhythm.     Heart sounds: Normal heart sounds.  Pulmonary:     Effort: Pulmonary effort is normal.     Breath sounds: Normal breath sounds.  Musculoskeletal:     Right lower leg: No edema.     Left lower leg: No edema.  Skin:    General: Skin is warm and dry.  Neurological:     General: No focal deficit present.     Mental Status: He is alert.  Psychiatric:        Behavior: Behavior normal.    Results for orders placed or performed in visit on 01/03/24  CBC with Differential/Platelet   Collection Time: 01/03/24  4:30 PM  Result Value Ref Range   WBC 5.5 3.8 - 10.8 Thousand/uL   RBC 4.67 4.20 - 5.80 Million/uL   Hemoglobin 13.4 13.2 - 17.1 g/dL   HCT 59.3 60.5 - 48.8 %   MCV 86.9 81.4 - 101.7 fL   MCH 28.7 27.0 - 33.0 pg   MCHC 33.0 31.6 - 35.4 g/dL   RDW 86.8 88.9 - 84.9 %   Platelets 183 140 - 400 Thousand/uL   MPV 9.5 7.5 - 12.5 fL   Neutro Abs 3,047 1,500 - 7,800 cells/uL   Absolute Lymphocytes 1,903 850 - 3,900 cells/uL   Absolute Monocytes 374 200 - 950 cells/uL   Eosinophils Absolute 149 15 - 500 cells/uL   Basophils Absolute 28 0 - 200 cells/uL   Neutrophils Relative % 55.4 %   Total Lymphocyte 34.6 %   Monocytes Relative 6.8 %   Eosinophils Relative 2.7 %   Basophils Relative 0.5 %  Comprehensive metabolic panel with GFR   Collection Time: 01/03/24  4:30 PM  Result Value Ref Range   Glucose, Bld 98 65 - 99 mg/dL   BUN 26 (H) 7 - 25 mg/dL   Creat 8.95 9.29 - 8.71 mg/dL   eGFR 74 > OR = 60 fO/fpw/8.26f7   BUN/Creatinine Ratio 25 (H) 6 - 22 (calc)   Sodium 137 135 - 146 mmol/L   Potassium 4.5 3.5 - 5.3 mmol/L   Chloride 100 98 - 110 mmol/L   CO2 29 20 - 32 mmol/L   Calcium  10.3 8.6 - 10.3 mg/dL   Total Protein 7.0 6.1 - 8.1 g/dL   Albumin  4.3 3.6 - 5.1 g/dL   Globulin 2.7 1.9 - 3.7 g/dL (calc)   AG Ratio 1.6 1.0 - 2.5 (calc)   Total Bilirubin 0.4 0.2 - 1.2 mg/dL   Alkaline phosphatase (APISO) 78 35 - 144 U/L   AST 21 10 - 35 U/L   ALT  21 9 - 46 U/L  Magnesium   Collection Time: 01/03/24  4:30 PM  Result Value Ref Range   Magnesium 1.8 1.5 - 2.5 mg/dL  T4, free   Collection Time: 01/03/24  4:30 PM  Result Value Ref Range   Free T4 1.2 0.8 - 1.8 ng/dL  TSH   Collection Time: 01/03/24  4:30 PM  Result Value Ref Range   TSH 2.21 0.40 - 4.50 mIU/L  Vitamin B12   Collection Time: 01/03/24  4:30 PM  Result Value Ref Range   Vitamin B-12 335 200 - 1,100 pg/mL    Attestations:   Reviewed by  clinician on day of visit: allergies, medications, problem list, medical history, surgical history, family history, social history, and previous encounter notes.  The patient is being seen today for an acute visit.    [1]  Outpatient Medications Prior to Visit  Medication Sig   Ascorbic Acid (VITAMIN C PO) Take 1 tablet by mouth daily.   atorvastatin  (LIPITOR) 40 MG tablet TAKE 1 TABLET BY MOUTH EVERY DAY   nitroGLYCERIN  (NITROSTAT ) 0.4 MG SL tablet Place 1 tablet (0.4 mg total) under the tongue every 5 (five) minutes as needed for chest pain.   sodium chloride  (OCEAN) 0.65 % SOLN nasal spray Place 1 spray into both nostrils as needed for congestion (dry sockets).   zolpidem  (AMBIEN ) 10 MG tablet Take 1 tablet (10 mg total) by mouth at bedtime as needed. for sleep G47.00   [DISCONTINUED] tamsulosin  (FLOMAX ) 0.4 MG CAPS capsule Take 1 capsule (0.4 mg total) by mouth daily.   celecoxib  (CELEBREX ) 200 MG capsule Take 1 capsule (200 mg total) by mouth daily.   methocarbamol  (ROBAXIN ) 500 MG tablet Take 1 tablet (500 mg total) by mouth 3 (three) times daily. (Patient taking differently: Take 500 mg by mouth as needed.)   [DISCONTINUED] aspirin  EC 81 MG tablet Take 1 tablet (81 mg total) by mouth daily. Swallow whole. (Patient not taking: Reported on 01/03/2024)   [DISCONTINUED] ondansetron  (ZOFRAN ) 4 MG tablet Take 1 tablet (4 mg total) by mouth every 8 (eight) hours as needed for vomiting or nausea. (Patient not taking: Reported on  01/03/2024)   [DISCONTINUED] pantoprazole  (PROTONIX ) 40 MG tablet TAKE 1 TABLET BY MOUTH EVERY DAY (Patient not taking: Reported on 01/03/2024)   [DISCONTINUED] triamcinolone  (NASACORT ) 55 MCG/ACT AERO nasal inhaler Place 2 sprays into the nose daily. (Patient not taking: Reported on 01/03/2024)   No facility-administered medications prior to visit.  [2]  Allergies Allergen Reactions   Iohexol  Itching    Itching post 125cc Omni injection. Treated w/ 50 mg PO benedryl.  Onset Date: [id number]    "

## 2024-01-04 NOTE — Patient Instructions (Signed)
 Medication Update  Flomax  (tamsulosin ) has been discontinued.  Reason for stopping  You were experiencing orthostatic dizziness, meaning lightheadedness when standing up or changing positions. Flomax  can lower blood pressure when standing and increase the risk of dizziness and falls. For safety, this medication was stopped.  What to expect  * Dizziness related to Flomax  should improve over the next several days * Urinary symptoms may stay the same or may increase slightly; please let us  know if this happens  Safety instructions  * Stand up slowly from sitting or lying positions * Stay well hydrated * If you feel dizzy, sit or lie down right away * Avoid driving or risky activities if you feel lightheaded  When to contact us   * If dizziness does not improve after stopping Flomax  * If you have fainting, falls, chest pain, or palpitations * If urinary symptoms become significantly worse  Follow-up  We will reassess urinary symptoms and discuss alternative treatment options if needed.

## 2024-01-06 ENCOUNTER — Ambulatory Visit: Admitting: Nurse Practitioner

## 2024-01-16 ENCOUNTER — Encounter: Payer: Self-pay | Admitting: Nurse Practitioner

## 2024-01-16 ENCOUNTER — Ambulatory Visit: Admitting: Nurse Practitioner

## 2024-01-16 VITALS — BP 132/80 | HR 96 | Temp 97.6°F | Ht 71.0 in | Wt 173.6 lb

## 2024-01-16 DIAGNOSIS — G4733 Obstructive sleep apnea (adult) (pediatric): Secondary | ICD-10-CM | POA: Diagnosis not present

## 2024-01-16 DIAGNOSIS — R351 Nocturia: Secondary | ICD-10-CM

## 2024-01-16 DIAGNOSIS — N401 Enlarged prostate with lower urinary tract symptoms: Secondary | ICD-10-CM

## 2024-01-16 DIAGNOSIS — Z9181 History of falling: Secondary | ICD-10-CM | POA: Diagnosis not present

## 2024-01-16 NOTE — Progress Notes (Signed)
 "  Established Visit  BP 132/80 (BP Location: Left Arm, Patient Position: Sitting, Cuff Size: Normal)   Pulse 96   Temp 97.6 F (36.4 C)   Ht 5' 11 (1.803 m)   Wt 173 lb 9.6 oz (78.7 kg)   SpO2 99%   BMI 24.21 kg/m    Subjective:    Patient ID: Jason Robles, male    DOB: [birth year], 79 y.o.   MRN: [id number]  CC: Chief Complaint  Patient presents with   Pneumonia    Follow up, recent fall-fell face first in driveway on face/chest   Discussed the use of AI scribe software for clinical note transcription with the patient, who gave verbal consent to proceed.  HPI: Jason Robles is a 79 y.o. male presents for 4-week follow up.  At his initial visit with me, he noted experiencing urinary frequency, waking two to three times nightly to urinate with more episodes after drinking alcohol ; endorsed dribbling but no difficulty initiating stream. Last PSA within normal limits. I had started him on tamsulosin  daily.  He had dizziness while taking tamsulosin , which he stopped on January 2 after a visit with Dr. Prentiss. His dizziness has resolved since stopping the medication. He remains cautious about potential drug interactions based on prior pharmacist advice.  He has frequent nocturia that limits sleep to about three to four hours per night. Flomax  caused side effects.   About two and a half weeks ago he fell while taking out the trash, with rib pain and facial abrasions. The injuries have mostly resolved without ongoing pain.  He is in the process of obtaining a CPAP machine through the [facility] and is concerned about its comfort and effectiveness.     Past Medical History:  Diagnosis Date   Acute left lumbar radiculopathy 04/16/2017   Age-related nuclear cataract, bilateral 06/06/2021   Allergic conjunctivitis 08/16/2015   Allergic rhinitis 08/25/2007   Aortic atherosclerosis 07/27/2020   Arm paresthesia, left 06/27/2022   Benign prostatic hyperplasia 08/21/2006   Bilateral  carpal tunnel syndrome 07/26/2019   Blurred vision, left eye 06/07/2021   Chest pain 06/28/2010   Chronic post-traumatic stress disorder (PTSD)    Complication of anesthesia    woken up during surgeries   Crushing injury of right ring finger 04/15/2019   Degenerative joint disease of knee 08/21/2006   Diverticulosis of colon (without mention of hemorrhage) 04/18/2007   External hemorrhoids 04/18/2007   Ganglion cyst of wrist, right 11/11/2015   GERD (gastroesophageal reflux disease)    Hand arthritis 07/26/2019   Headache 09/20/2009   Hearing loss, 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/21/2006   Hyperglycemia 08/05/2019   Hyperlipidemia 08/21/2006   Inflammatory pain 01/26/2022   Insomnia 08/16/2015   Lightheadedness 04/30/2018   Low back pain 04/02/2017   Medial epicondylitis of left elbow 06/25/2012   Mild cognitive impairment of uncertain or unknown etiology 07/18/2022   Nail fungus 06/02/2018   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   Numbness of hand 04/26/2022   Osteoarthritis of glenohumeral joint 06/08/2019   Peripheral vascular disease 08/25/2007   Personal history of colonic polyps 04/18/2007   Poison oak 05/24/2020   Primary osteoarthritis involving multiple joints 04/15/2019   Raynaud's syndrome 08/21/2006   Renal insufficiency 07/31/2018   Right knee pain 06/25/2012   S/P reverse total shoulder arthroplasty, 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   Syncope 05/02/2012   TIA (transient ischemic attack) 06/28/2010   Upper back pain on left side 06/27/2022  Vertigo 04/30/2018   Vitamin B12 deficiency 06/07/2021   Vitamin D  deficiency 06/07/2021    Past Surgical History:  Procedure Laterality Date   APPENDECTOMY     COLONOSCOPY  07/25/2006   POLYPECTOMY     REVERSE SHOULDER ARTHROPLASTY Right 12/28/2021   Procedure: REVERSE SHOULDER ARTHROPLASTY;  Surgeon: Sharl Selinda Dover, MD;  Location: [facility];  Service: Orthopedics;  Laterality: Right;  150   s/p knee  surgery Bilateral    x's 4 since 1980   s/p left shoulder  2002   rotator cuff   s/p lumbar disc surgery  2007   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   TOTAL KNEE ARTHROPLASTY Right 03/26/2016   Procedure: RIGHT TOTAL KNEE ARTHROPLASTY;  Surgeon: Marcey Raman, MD;  Location: [facility];  Service: Orthopedics;  Laterality: Right;   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   Procedure: TOTAL KNEE ARTHROPLASTY;  Surgeon: Raman Marcey, MD;  Location: [facility];  Service: Orthopedics;  Laterality: Left;   UPPER GASTROINTESTINAL ENDOSCOPY      Family History  Problem Relation Age of Onset   Diabetes Mother    Alzheimer's disease Mother    Alzheimer's disease Sister    Kidney cancer Brother    Alzheimer's disease Maternal Aunt    Diabetes Maternal Uncle    Alzheimer's disease Half-Sister    Colon cancer Neg Hx    Colon polyps Neg Hx    Esophageal cancer Neg Hx    Rectal cancer Neg Hx    Stomach cancer Neg Hx      Social History[1]  Medications Ordered Prior to Encounter[2]   Review of Systems See pertinent positives and negatives per HPI.     Objective:    BP 132/80 (BP Location: Left Arm, Patient Position: Sitting, Cuff Size: Normal)   Pulse 96   Temp 97.6 F (36.4 C)   Ht 5' 11 (1.803 m)   Wt 173 lb 9.6 oz (78.7 kg)   SpO2 99%   BMI 24.21 kg/m   Wt Readings from Last 3 Encounters:  01/16/24 173 lb 9.6 oz (78.7 kg)  01/03/24 172 lb 6.4 oz (78.2 kg)  12/19/23 170 lb (77.1 kg)    BP Readings from Last 3 Encounters:  01/16/24 132/80  01/03/24 116/70  12/19/23 112/72    Physical Exam Vitals and nursing note reviewed.  Constitutional:      Appearance: Normal appearance.  HENT:     Head: Normocephalic.  Eyes:     Conjunctiva/sclera: Conjunctivae normal.  Cardiovascular:     Rate and Rhythm: Normal rate and regular rhythm.     Pulses: Normal pulses.     Heart sounds: Normal heart sounds.  Pulmonary:     Effort: Pulmonary effort is normal.     Breath sounds: Normal  breath sounds.  Abdominal:     Palpations: Abdomen is soft.     Tenderness: There is no abdominal tenderness.  Musculoskeletal:     Cervical back: Normal range of motion.  Skin:    General: Skin is warm.  Neurological:     General: No focal deficit present.     Mental Status: He is alert and oriented to person, place, and time.  Psychiatric:        Mood and Affect: Mood normal.        Behavior: Behavior normal.        Thought Content: Thought content normal.        Judgment: Judgment normal.        Assessment &  Plan:   Problem List Items Addressed This Visit       Respiratory   OSA (obstructive sleep apnea)   He recently obtained a CPAP machine and has concerns about comfort and claustrophobia. Proceed with CPAP fitting and education at the [facility]. Consider different mask options if the initial mask is uncomfortable.         Genitourinary   Benign prostatic hyperplasia - Primary   Tamsulosin  was discontinued due to dizziness. He declined further urological interventions. Encourage adequate hydration earlier in the day to reduce nocturia. Recommend saw palmetto supplement for urinary symptoms. Reviewed labs from prior visit.       Other Visit Diagnoses       History of fall       Tripped over uneven spot in driveway. Abrasion healed and rib soreness improving. Follow-up with any concerns.       Follow up plan: Return in about 6 months (around 07/15/2024) for CPE.   Tinnie DELENA Harada, NP  I,Emily Lagle,acting as a scribe for [employer], NP.,have documented all relevant documentation on the behalf of Ravinder Hofland DELENA Harada, NP.  I, Tinnie DELENA Harada, NP, have reviewed all documentation for this visit. The documentation on 01/16/2024 for the exam, diagnosis, procedures, and orders are all accurate and complete.     [1]  Social History Tobacco Use   Smoking status: Former    Current packs/day: 0.00    Average packs/day: 1 pack/day for 15.0 years (15.0 ttl pk-yrs)     Types: Cigarettes    Start date: 01/30/1965    Quit date: 01/31/1980    Years since quitting: 43.9    Passive exposure: Never   Smokeless tobacco: Never  Vaping Use   Vaping status: Never Used  Substance Use Topics   Alcohol  use: Not Currently    Alcohol /week: 2.0 standard drinks of alcohol     Types: 2 Shots of liquor per week   Drug use: No  [2]  Current Outpatient Medications on File Prior to Visit  Medication Sig Dispense Refill   Ascorbic Acid (VITAMIN C PO) Take 1 tablet by mouth daily.     atorvastatin  (LIPITOR) 40 MG tablet TAKE 1 TABLET BY MOUTH EVERY DAY 90 tablet 1   celecoxib  (CELEBREX ) 200 MG capsule Take 1 capsule (200 mg total) by mouth daily.     methocarbamol  (ROBAXIN ) 500 MG tablet Take 1 tablet (500 mg total) by mouth 3 (three) times daily. (Patient taking differently: Take 500 mg by mouth as needed.) 90 tablet 2   nitroGLYCERIN  (NITROSTAT ) 0.4 MG SL tablet Place 1 tablet (0.4 mg total) under the tongue every 5 (five) minutes as needed for chest pain. 25 tablet 3   sodium chloride  (OCEAN) 0.65 % SOLN nasal spray Place 1 spray into both nostrils as needed for congestion (dry sockets).     zolpidem  (AMBIEN ) 10 MG tablet Take 1 tablet (10 mg total) by mouth at bedtime as needed. for sleep G47.00 90 tablet 0   No current facility-administered medications on file prior to visit.   "

## 2024-01-16 NOTE — Patient Instructions (Addendum)
 It was great to see you!  You can try saw palmetto supplement to help with prostate and urine symptoms   Let's follow-up in 6 months, sooner if you have concerns.  If a referral was placed today, you will be contacted for an appointment. Please note that routine referrals can sometimes take up to 3-4 weeks to process. Please call our office if you haven't heard anything after this time frame.  Take care,  Tinnie Harada, NP

## 2024-01-17 ENCOUNTER — Ambulatory Visit: Payer: Self-pay | Admitting: Family Medicine

## 2024-01-17 DIAGNOSIS — G4733 Obstructive sleep apnea (adult) (pediatric): Secondary | ICD-10-CM | POA: Insufficient documentation

## 2024-01-17 NOTE — Assessment & Plan Note (Signed)
 He recently obtained a CPAP machine and has concerns about comfort and claustrophobia. Proceed with CPAP fitting and education at the [facility]. Consider different mask options if the initial mask is uncomfortable.

## 2024-01-17 NOTE — Assessment & Plan Note (Addendum)
 Tamsulosin  was discontinued due to dizziness. He declined further urological interventions. Encourage adequate hydration earlier in the day to reduce nocturia. Recommend saw palmetto supplement for urinary symptoms. Reviewed labs from prior visit.

## 2024-01-29 ENCOUNTER — Encounter: Payer: Self-pay | Admitting: Neurology

## 2024-01-30 ENCOUNTER — Other Ambulatory Visit (HOSPITAL_COMMUNITY): Payer: Self-pay | Admitting: Neurology

## 2024-01-30 DIAGNOSIS — R251 Tremor, unspecified: Secondary | ICD-10-CM

## 2024-02-11 ENCOUNTER — Encounter (HOSPITAL_COMMUNITY)

## 2024-07-14 ENCOUNTER — Ambulatory Visit: Admitting: Physician Assistant

## 2024-07-15 ENCOUNTER — Encounter: Admitting: Nurse Practitioner

## 2024-07-21 ENCOUNTER — Ambulatory Visit: Payer: Self-pay

## 2024-07-21 ENCOUNTER — Institutional Professional Consult (permissible substitution): Admitting: Psychology

## 2024-07-28 ENCOUNTER — Encounter: Admitting: Psychology

## 2024-07-30 ENCOUNTER — Ambulatory Visit: Admitting: Physician Assistant
